# Patient Record
Sex: Male | Born: 1955
Health system: Southern US, Community
[De-identification: ages and names within clinical notes are randomized; demographics above are authoritative.]

## PROBLEM LIST (undated history)

## (undated) DIAGNOSIS — G20A1 Parkinson's disease without dyskinesia, without mention of fluctuations: Secondary | ICD-10-CM

## (undated) DIAGNOSIS — M1990 Unspecified osteoarthritis, unspecified site: Secondary | ICD-10-CM

## (undated) DIAGNOSIS — I2699 Other pulmonary embolism without acute cor pulmonale: Principal | ICD-10-CM

## (undated) DIAGNOSIS — I82409 Acute embolism and thrombosis of unspecified deep veins of unspecified lower extremity: Secondary | ICD-10-CM

## (undated) DIAGNOSIS — R251 Tremor, unspecified: Secondary | ICD-10-CM

## (undated) DIAGNOSIS — R55 Syncope and collapse: Secondary | ICD-10-CM

## (undated) DIAGNOSIS — K429 Umbilical hernia without obstruction or gangrene: Secondary | ICD-10-CM

## (undated) DIAGNOSIS — M549 Dorsalgia, unspecified: Secondary | ICD-10-CM

## (undated) DIAGNOSIS — G20 Parkinson's disease: Secondary | ICD-10-CM

## (undated) DIAGNOSIS — G8929 Other chronic pain: Secondary | ICD-10-CM

## (undated) DIAGNOSIS — K4090 Unilateral inguinal hernia, without obstruction or gangrene, not specified as recurrent: Principal | ICD-10-CM

## (undated) DIAGNOSIS — R569 Unspecified convulsions: Secondary | ICD-10-CM

## (undated) HISTORY — DX: Unspecified osteoarthritis, unspecified site: M19.90

## (undated) HISTORY — DX: Acute embolism and thrombosis of unspecified deep veins of unspecified lower extremity: I82.409

## (undated) HISTORY — DX: Unspecified convulsions: R56.9

## (undated) HISTORY — DX: Other pulmonary embolism without acute cor pulmonale: I26.99

## (undated) HISTORY — DX: Unilateral inguinal hernia, without obstruction or gangrene, not specified as recurrent: K40.90

## (undated) HISTORY — PX: HERNIA REPAIR: SHX51

---

## 1975-03-14 HISTORY — PX: KNEE SURGERY: SHX244

## 1998-11-29 ENCOUNTER — Encounter: Admission: RE | Admit: 1998-11-29 | Discharge: 1998-11-29 | Payer: Self-pay | Admitting: Sports Medicine

## 1999-01-14 ENCOUNTER — Encounter: Admission: RE | Admit: 1999-01-14 | Discharge: 1999-01-14 | Payer: Self-pay | Admitting: Sports Medicine

## 1999-06-16 ENCOUNTER — Encounter: Admission: RE | Admit: 1999-06-16 | Discharge: 1999-06-16 | Payer: Self-pay | Admitting: Family Medicine

## 2001-02-01 ENCOUNTER — Encounter: Admission: RE | Admit: 2001-02-01 | Discharge: 2001-02-01 | Payer: Self-pay | Admitting: Sports Medicine

## 2001-02-01 ENCOUNTER — Encounter: Payer: Self-pay | Admitting: Sports Medicine

## 2001-02-05 ENCOUNTER — Encounter: Admission: RE | Admit: 2001-02-05 | Discharge: 2001-02-05 | Payer: Self-pay | Admitting: Sports Medicine

## 2001-12-07 ENCOUNTER — Encounter: Payer: Self-pay | Admitting: Emergency Medicine

## 2001-12-07 ENCOUNTER — Emergency Department (HOSPITAL_COMMUNITY): Admission: EM | Admit: 2001-12-07 | Discharge: 2001-12-07 | Payer: Self-pay | Admitting: Emergency Medicine

## 2001-12-11 ENCOUNTER — Ambulatory Visit (HOSPITAL_COMMUNITY): Admission: RE | Admit: 2001-12-11 | Discharge: 2001-12-11 | Payer: Self-pay | Admitting: Emergency Medicine

## 2001-12-11 ENCOUNTER — Encounter: Payer: Self-pay | Admitting: Emergency Medicine

## 2001-12-31 ENCOUNTER — Encounter: Payer: Self-pay | Admitting: Gastroenterology

## 2001-12-31 ENCOUNTER — Ambulatory Visit (HOSPITAL_COMMUNITY): Admission: RE | Admit: 2001-12-31 | Discharge: 2001-12-31 | Payer: Self-pay | Admitting: Gastroenterology

## 2002-04-10 ENCOUNTER — Ambulatory Visit (HOSPITAL_COMMUNITY): Admission: RE | Admit: 2002-04-10 | Discharge: 2002-04-10 | Payer: Self-pay | Admitting: Gastroenterology

## 2002-04-10 ENCOUNTER — Encounter: Payer: Self-pay | Admitting: Gastroenterology

## 2002-09-22 ENCOUNTER — Ambulatory Visit (HOSPITAL_COMMUNITY): Admission: RE | Admit: 2002-09-22 | Discharge: 2002-09-22 | Payer: Self-pay | Admitting: Gastroenterology

## 2002-09-22 ENCOUNTER — Encounter: Payer: Self-pay | Admitting: Gastroenterology

## 2002-10-07 ENCOUNTER — Emergency Department (HOSPITAL_COMMUNITY): Admission: EM | Admit: 2002-10-07 | Discharge: 2002-10-08 | Payer: Self-pay | Admitting: Emergency Medicine

## 2002-10-07 ENCOUNTER — Encounter: Payer: Self-pay | Admitting: Emergency Medicine

## 2003-10-16 ENCOUNTER — Ambulatory Visit (HOSPITAL_COMMUNITY): Admission: RE | Admit: 2003-10-16 | Discharge: 2003-10-16 | Payer: Self-pay | Admitting: Gastroenterology

## 2005-12-25 ENCOUNTER — Ambulatory Visit (HOSPITAL_COMMUNITY): Admission: RE | Admit: 2005-12-25 | Discharge: 2005-12-25 | Payer: Self-pay | Admitting: Gastroenterology

## 2009-11-07 ENCOUNTER — Observation Stay (HOSPITAL_COMMUNITY): Admission: EM | Admit: 2009-11-07 | Discharge: 2009-11-10 | Payer: Self-pay | Admitting: Emergency Medicine

## 2009-11-07 DIAGNOSIS — I82409 Acute embolism and thrombosis of unspecified deep veins of unspecified lower extremity: Secondary | ICD-10-CM

## 2009-11-07 DIAGNOSIS — I2699 Other pulmonary embolism without acute cor pulmonale: Secondary | ICD-10-CM

## 2009-11-07 HISTORY — DX: Acute embolism and thrombosis of unspecified deep veins of unspecified lower extremity: I82.409

## 2009-11-07 HISTORY — DX: Other pulmonary embolism without acute cor pulmonale: I26.99

## 2009-11-08 ENCOUNTER — Ambulatory Visit: Payer: Self-pay | Admitting: Vascular Surgery

## 2009-11-08 ENCOUNTER — Encounter (INDEPENDENT_AMBULATORY_CARE_PROVIDER_SITE_OTHER): Payer: Self-pay | Admitting: Family Medicine

## 2009-11-18 ENCOUNTER — Ambulatory Visit: Payer: Self-pay | Admitting: Oncology

## 2010-03-17 ENCOUNTER — Encounter
Admission: RE | Admit: 2010-03-17 | Discharge: 2010-03-17 | Payer: Self-pay | Source: Home / Self Care | Attending: Family Medicine | Admitting: Family Medicine

## 2010-05-26 LAB — COMPREHENSIVE METABOLIC PANEL
ALT: 20 U/L (ref 0–53)
AST: 18 U/L (ref 0–37)
Albumin: 3.6 g/dL (ref 3.5–5.2)
Alkaline Phosphatase: 79 U/L (ref 39–117)
BUN: 11 mg/dL (ref 6–23)
CO2: 30 mEq/L (ref 19–32)
Calcium: 9.1 mg/dL (ref 8.4–10.5)
Chloride: 101 mEq/L (ref 96–112)
Creatinine, Ser: 0.9 mg/dL (ref 0.4–1.5)
GFR calc Af Amer: >60 mL/min (ref >60)
GFR calc non Af Amer: >60 mL/min (ref >60)
Glucose, Bld: 98 mg/dL (ref 70–99)
Potassium: 3.9 mEq/L (ref 3.5–5.1)
Sodium: 137 mEq/L (ref 135–145)
Total Bilirubin: 0.7 mg/dL (ref 0.3–1.2)
Total Protein: 8.3 g/dL (ref 6.0–8.3)

## 2010-05-26 LAB — LIPID PANEL
Cholesterol: 152 mg/dL (ref 0–200)
HDL: 41 mg/dL (ref >39)
LDL Cholesterol: 101 mg/dL — ABNORMAL HIGH (ref 0–99)
Total CHOL/HDL Ratio: 3.7 RATIO
Triglycerides: 52 mg/dL (ref <150)
VLDL: 10 mg/dL (ref 0–40)

## 2010-05-26 LAB — CBC
HCT: 41.6 % (ref 39.0–52.0)
HCT: 41.9 % (ref 39.0–52.0)
HCT: 46.4 % (ref 39.0–52.0)
Hemoglobin: 13.8 g/dL (ref 13.0–17.0)
Hemoglobin: 14.1 g/dL (ref 13.0–17.0)
Hemoglobin: 15.3 g/dL (ref 13.0–17.0)
MCH: 28.6 pg (ref 26.0–34.0)
MCH: 28.8 pg (ref 26.0–34.0)
MCH: 28.9 pg (ref 26.0–34.0)
MCHC: 33 g/dL (ref 30.0–36.0)
MCHC: 33.2 g/dL (ref 30.0–36.0)
MCHC: 33.6 g/dL (ref 30.0–36.0)
MCV: 85.7 fL (ref 78.0–100.0)
MCV: 86.1 fL (ref 78.0–100.0)
MCV: 87.3 fL (ref 78.0–100.0)
Platelets: 200 10*3/uL (ref 150–400)
Platelets: 218 10*3/uL (ref 150–400)
Platelets: 222 10*3/uL (ref 150–400)
RBC: 4.83 MIL/uL (ref 4.22–5.81)
RBC: 4.88 MIL/uL (ref 4.22–5.81)
RBC: 5.32 MIL/uL (ref 4.22–5.81)
RDW: 15.5 % (ref 11.5–15.5)
RDW: 15.7 % — ABNORMAL HIGH (ref 11.5–15.5)
RDW: 15.9 % — ABNORMAL HIGH (ref 11.5–15.5)
WBC: 7.4 10*3/uL (ref 4.0–10.5)
WBC: 7.8 10*3/uL (ref 4.0–10.5)
WBC: 7.9 10*3/uL (ref 4.0–10.5)

## 2010-05-26 LAB — CK TOTAL AND CKMB (NOT AT ARMC)
CK, MB: 1.8 ng/mL (ref 0.3–4.0)
Relative Index: INVALID (ref 0.0–2.5)
Total CK: 91 U/L (ref 7–232)

## 2010-05-26 LAB — PROTIME-INR
INR: 1.09 (ref 0.00–1.49)
INR: 1.18 (ref 0.00–1.49)
INR: 1.43 (ref 0.00–1.49)
INR: 1.76 — ABNORMAL HIGH (ref 0.00–1.49)
Prothrombin Time: 14.3 seconds (ref 11.6–15.2)
Prothrombin Time: 15.2 seconds (ref 11.6–15.2)
Prothrombin Time: 17.6 seconds — ABNORMAL HIGH (ref 11.6–15.2)
Prothrombin Time: 20.7 seconds — ABNORMAL HIGH (ref 11.6–15.2)

## 2010-05-26 LAB — RAPID URINE DRUG SCREEN, HOSP PERFORMED
Amphetamines: NOT DETECTED
Barbiturates: NOT DETECTED
Benzodiazepines: NOT DETECTED
Cocaine: NOT DETECTED
Opiates: POSITIVE — AB
Tetrahydrocannabinol: NOT DETECTED

## 2010-05-26 LAB — POCT CARDIAC MARKERS
CKMB, poc: 1.2 ng/mL (ref 1.0–8.0)
Myoglobin, poc: 87.6 ng/mL (ref 12–200)
Troponin i, poc: <0.05 ng/mL (ref 0.00–0.09)

## 2010-05-26 LAB — CARDIAC PANEL(CRET KIN+CKTOT+MB+TROPI)
CK, MB: 1.2 ng/mL (ref 0.3–4.0)
CK, MB: 1.6 ng/mL (ref 0.3–4.0)
Relative Index: INVALID (ref 0.0–2.5)
Relative Index: INVALID (ref 0.0–2.5)
Total CK: 72 U/L (ref 7–232)
Total CK: 83 U/L (ref 7–232)
Troponin I: 0.01 ng/mL (ref 0.00–0.06)
Troponin I: 0.01 ng/mL (ref 0.00–0.06)

## 2010-05-26 LAB — DIFFERENTIAL
Basophils Absolute: 0 10*3/uL (ref 0.0–0.1)
Basophils Relative: 0 % (ref 0–1)
Eosinophils Absolute: 0.1 10*3/uL (ref 0.0–0.7)
Eosinophils Relative: 1 % (ref 0–5)
Lymphocytes Relative: 10 % — ABNORMAL LOW (ref 12–46)
Lymphs Abs: 0.8 10*3/uL (ref 0.7–4.0)
Monocytes Absolute: 0.5 10*3/uL (ref 0.1–1.0)
Monocytes Relative: 7 % (ref 3–12)
Neutro Abs: 6.4 10*3/uL (ref 1.7–7.7)
Neutrophils Relative %: 82 % — ABNORMAL HIGH (ref 43–77)

## 2010-05-26 LAB — HEMOGLOBIN A1C
Hgb A1c MFr Bld: 6.1 % — ABNORMAL HIGH (ref <5.7)
Mean Plasma Glucose: 128 mg/dL — ABNORMAL HIGH (ref <117)

## 2010-05-26 LAB — TROPONIN I: Troponin I: 0.01 ng/mL (ref 0.00–0.06)

## 2010-05-26 LAB — APTT: aPTT: 35 seconds (ref 24–37)

## 2010-05-26 LAB — D-DIMER, QUANTITATIVE (NOT AT ARMC): D-Dimer, Quant: 7.68 ug/mL-FEU — ABNORMAL HIGH (ref 0.00–0.48)

## 2010-07-27 ENCOUNTER — Other Ambulatory Visit: Payer: Self-pay | Admitting: Oncology

## 2010-07-27 ENCOUNTER — Encounter (HOSPITAL_BASED_OUTPATIENT_CLINIC_OR_DEPARTMENT_OTHER): Payer: 59 | Admitting: Oncology

## 2010-07-27 DIAGNOSIS — I824Z9 Acute embolism and thrombosis of unspecified deep veins of unspecified distal lower extremity: Secondary | ICD-10-CM

## 2010-07-27 LAB — CBC & DIFF AND RETIC
BASO%: 0.3 % (ref 0.0–2.0)
Basophils Absolute: 0 10*3/uL (ref 0.0–0.1)
EOS%: 2.3 % (ref 0.0–7.0)
Eosinophils Absolute: 0.2 10*3/uL (ref 0.0–0.5)
HCT: 42.9 % (ref 38.4–49.9)
HGB: 14 g/dL (ref 13.0–17.1)
Immature Retic Fract: 0.3 % (ref 0.00–13.40)
LYMPH%: 14.5 % (ref 14.0–49.0)
MCH: 26.5 pg — ABNORMAL LOW (ref 27.2–33.4)
MCHC: 32.6 g/dL (ref 32.0–36.0)
MCV: 81.1 fL (ref 79.3–98.0)
MONO#: 0.5 10*3/uL (ref 0.1–0.9)
MONO%: 8.3 % (ref 0.0–14.0)
NEUT#: 4.8 10*3/uL (ref 1.5–6.5)
NEUT%: 74.6 % (ref 39.0–75.0)
Platelets: 223 10*3/uL (ref 140–400)
RBC: 5.29 10*6/uL (ref 4.20–5.82)
RDW: 15.8 % — ABNORMAL HIGH (ref 11.0–14.6)
Retic %: 0.48 % — ABNORMAL LOW (ref 0.50–1.60)
Retic Ct Abs: 25.39 10*3/uL (ref 24.10–77.50)
WBC: 6.5 10*3/uL (ref 4.0–10.3)
lymph#: 0.9 10*3/uL (ref 0.9–3.3)

## 2010-07-27 LAB — PROTIME-INR
INR: 1.1 — ABNORMAL LOW (ref 2.00–3.50)
Protime: 13.2 Seconds (ref 10.6–13.4)

## 2010-07-27 LAB — MORPHOLOGY: PLT EST: ADEQUATE

## 2010-07-27 LAB — CHCC SMEAR

## 2010-07-29 LAB — LUPUS ANTICOAGULANT PANEL
DRVVT 1:1 Mix: 39.5 secs (ref 36.2–44.3)
DRVVT: 52.1 secs — ABNORMAL HIGH (ref 36.2–44.3)
Lupus Anticoagulant: NOT DETECTED
PTT Lupus Anticoagulant: 48.8 secs — ABNORMAL HIGH (ref 30.0–45.6)
PTTLA 4:1 Mix: 43.1 secs (ref 30.0–45.6)

## 2010-07-29 LAB — LACTATE DEHYDROGENASE: LDH: 113 U/L (ref 94–250)

## 2010-07-29 LAB — BETA-2 GLYCOPROTEIN ANTIBODIES
Beta-2-Glycoprotein I IgA: 11 A Units (ref <20)
Beta-2-Glycoprotein I IgM: 2 M Units (ref <20)

## 2010-07-29 LAB — COMPREHENSIVE METABOLIC PANEL
ALT: 17 U/L (ref 0–53)
AST: 16 U/L (ref 0–37)
Albumin: 3.9 g/dL (ref 3.5–5.2)
Alkaline Phosphatase: 80 U/L (ref 39–117)
BUN: 14 mg/dL (ref 6–23)
CO2: 26 mEq/L (ref 19–32)
Calcium: 9.4 mg/dL (ref 8.4–10.5)
Chloride: 102 mEq/L (ref 96–112)
Creatinine, Ser: 0.92 mg/dL (ref 0.40–1.50)
Glucose, Bld: 89 mg/dL (ref 70–99)
Potassium: 3.9 mEq/L (ref 3.5–5.3)
Sodium: 138 mEq/L (ref 135–145)
Total Bilirubin: 0.5 mg/dL (ref 0.3–1.2)
Total Protein: 7.2 g/dL (ref 6.0–8.3)

## 2010-07-29 LAB — CARDIOLIPIN ANTIBODIES, IGG, IGM, IGA
Anticardiolipin IgA: 14 APL U/mL (ref <22)
Anticardiolipin IgG: 16 GPL U/mL (ref <23)
Anticardiolipin IgM: 2 MPL U/mL (ref <11)

## 2010-07-29 LAB — HOMOCYSTEINE: Homocysteine: 8.8 umol/L (ref 4.0–15.4)

## 2010-07-29 LAB — FACTOR 5 LEIDEN

## 2010-07-29 LAB — D-DIMER, QUANTITATIVE (NOT AT ARMC): D-Dimer, Quant: 0.67 ug/mL-FEU — ABNORMAL HIGH (ref 0.00–0.48)

## 2010-07-29 LAB — PROTHROMBIN GENE MUTATION

## 2010-08-03 ENCOUNTER — Other Ambulatory Visit: Payer: Self-pay | Admitting: Oncology

## 2010-08-03 ENCOUNTER — Encounter (HOSPITAL_BASED_OUTPATIENT_CLINIC_OR_DEPARTMENT_OTHER): Payer: 59 | Admitting: Oncology

## 2010-08-03 DIAGNOSIS — I824Z9 Acute embolism and thrombosis of unspecified deep veins of unspecified distal lower extremity: Secondary | ICD-10-CM

## 2010-08-03 DIAGNOSIS — K769 Liver disease, unspecified: Secondary | ICD-10-CM

## 2010-08-09 LAB — ANTITHROMBIN III: AntiThromb III Func: 79 % (ref 76–126)

## 2010-08-09 LAB — PROTEIN S, TOTAL: Protein S Total: 79 % (ref 60–150)

## 2010-08-09 LAB — PROTEIN C ACTIVITY: Protein C Activity: 112 % (ref 75–133)

## 2010-08-09 LAB — PROTEIN S, ANTIGEN, FREE: Protein S Ag, Free: 58 % normal (ref 57–171)

## 2010-08-09 LAB — PROTEIN C, TOTAL: Protein C, Total: 76 % (ref 72–160)

## 2010-08-09 LAB — PROTEIN S ACTIVITY: Protein S Activity: 71 % (ref 69–129)

## 2010-08-17 ENCOUNTER — Ambulatory Visit (HOSPITAL_COMMUNITY)
Admission: RE | Admit: 2010-08-17 | Discharge: 2010-08-17 | Disposition: A | Discharge status: Home / Self Care | Payer: 59 | Source: Ambulatory Visit | Attending: Oncology | Admitting: Oncology

## 2010-08-17 DIAGNOSIS — K769 Liver disease, unspecified: Secondary | ICD-10-CM

## 2011-01-16 ENCOUNTER — Other Ambulatory Visit: Payer: Self-pay | Admitting: Oncology

## 2011-01-16 ENCOUNTER — Other Ambulatory Visit (HOSPITAL_BASED_OUTPATIENT_CLINIC_OR_DEPARTMENT_OTHER): Payer: 59 | Admitting: Lab

## 2011-01-16 DIAGNOSIS — I824Z9 Acute embolism and thrombosis of unspecified deep veins of unspecified distal lower extremity: Secondary | ICD-10-CM

## 2011-01-16 DIAGNOSIS — I2699 Other pulmonary embolism without acute cor pulmonale: Secondary | ICD-10-CM

## 2011-01-16 LAB — D-DIMER, QUANTITATIVE (NOT AT ARMC): D-Dimer, Quant: 0.68 ug/mL-FEU — ABNORMAL HIGH (ref 0.00–0.48)

## 2011-01-17 LAB — PROTEIN C ACTIVITY
Protein C Activity: 112 % (ref 75–133)
Protein C Activity: 112 % (ref 75–133)

## 2011-01-17 LAB — PROTEIN S, TOTAL
Protein S Total: 79 % (ref 60–150)
Protein S Total: 79 % (ref 60–150)

## 2011-01-17 LAB — PROTEIN C, TOTAL
Protein C, Total: 76 % (ref 72–160)
Protein C, Total: 76 % (ref 72–160)

## 2011-01-17 LAB — PROTEIN S ACTIVITY
Protein S Activity: 71 % (ref 69–129)
Protein S Activity: 71 % (ref 69–129)

## 2011-01-17 LAB — ANTITHROMBIN III
AntiThromb III Func: 79 % (ref 76–126)
AntiThromb III Func: 79 % (ref 76–126)

## 2011-01-17 LAB — PROTEIN S, ANTIGEN, FREE: Protein S Ag, Free: 58 % normal (ref 57–171)

## 2011-02-06 ENCOUNTER — Telehealth: Payer: Self-pay | Admitting: *Deleted

## 2011-02-06 NOTE — Telephone Encounter (Signed)
Pt. Would like results of his lab tests done three weeks ago 01/16/11.  Please call him at [phone].

## 2011-02-08 NOTE — Letter (Signed)
February 07, 2011    Clydie Braun L. Hal Hope, M.D. [address] [place], [place] [address]  NAME:  Cory Johnson, Cory Johnson MRN:  [id number] DOB:  Jun 15, 1955  Dear Clydie Braun:  Sorry for the delay in this letter.  I evaluated Cory Johnson back in May for unprovoked DVT and pulmonary embolus, which occurred in August 2011.  He stopped his Coumadin after 6 months.  I took the opportunity to test a hypercoagulation profile.  The results do not give a specific reason why he clotted.  He has a persistent mild elevation of D-dimer at 0.68 (0-0.48) repeated again just this month.  The labs from May 2012 show antithrombin level 79% of control (76-126), plasma homocystine normal at 8.8.  He tests negative for both the factor V Leiden and the prothrombin gene mutations.  He does not have any elevation of anticardiolipin antibodies or anti-beta-2 glycoprotein 1 antibodies and a lupus anticoagulant is not detected.  The functional protein C level is 112% (71-133) with borderline low functional protein S of 71% (69- 129), and free protein S (57-171).  It is hard to estimate his long-term risk.  He is still a young man.  I left him a message to go on 1 aspirin daily.  Although this does not provide a significant benefit, I believe it is better than nothing. Some recent data reported in the [place] Journal by an [place] group supports this.  I did not check a factor VIII activity.  Elevated factor VIII levels to standard deviations or above control value have been associated with increased risk for pulmonary emboli, especially in African Americans. It might be worthwhile checking this.  We can get him back to our office to do the lab testing.   Sincerely,    Levert Feinstein, M.D., F.A.C.P.  JMG/MEDQ  D:  02/07/2011  T:  02/08/2011  Job:  [id number]  CC:   Griffith Citron, M.D.

## 2011-02-11 DIAGNOSIS — K4090 Unilateral inguinal hernia, without obstruction or gangrene, not specified as recurrent: Secondary | ICD-10-CM

## 2011-02-11 DIAGNOSIS — K429 Umbilical hernia without obstruction or gangrene: Secondary | ICD-10-CM

## 2011-02-11 HISTORY — DX: Unilateral inguinal hernia, without obstruction or gangrene, not specified as recurrent: K40.90

## 2011-02-11 HISTORY — DX: Umbilical hernia without obstruction or gangrene: K42.9

## 2011-02-24 ENCOUNTER — Ambulatory Visit (INDEPENDENT_AMBULATORY_CARE_PROVIDER_SITE_OTHER): Payer: 59

## 2011-02-24 DIAGNOSIS — K4090 Unilateral inguinal hernia, without obstruction or gangrene, not specified as recurrent: Secondary | ICD-10-CM

## 2011-03-02 ENCOUNTER — Encounter (INDEPENDENT_AMBULATORY_CARE_PROVIDER_SITE_OTHER): Payer: Self-pay | Admitting: General Surgery

## 2011-03-02 ENCOUNTER — Ambulatory Visit (INDEPENDENT_AMBULATORY_CARE_PROVIDER_SITE_OTHER): Payer: 59 | Admitting: General Surgery

## 2011-03-02 VITALS — BP 118/76 | HR 70 | Temp 97.6°F | Resp 18 | Ht 73.0 in | Wt 243.2 lb

## 2011-03-02 DIAGNOSIS — K4090 Unilateral inguinal hernia, without obstruction or gangrene, not specified as recurrent: Secondary | ICD-10-CM

## 2011-03-02 DIAGNOSIS — K429 Umbilical hernia without obstruction or gangrene: Secondary | ICD-10-CM

## 2011-03-02 NOTE — Patient Instructions (Signed)
You will be scheduled for surgery to repair both your umbilical hernia and your right inguinal hernia with mesh.  Inguinal Hernia, Adult Muscles help keep everything in the body in its proper place. But if a weak spot in the muscles develops, something can poke through. That is called a hernia. When this happens in the lower part of the belly (abdomen), it is called an inguinal hernia. (It takes its name from a part of the body in this region called the inguinal canal.) A weak spot in the wall of muscles lets some fat or part of the small intestine bulge through. An inguinal hernia can develop at any age. Men get them more often than women. CAUSES  In adults, an inguinal hernia develops over time.  It can be triggered by:   Suddenly straining the muscles of the lower abdomen.   Lifting heavy objects.   Straining to have a bowel movement. Difficult bowel movements (constipation) can lead to this.   Constant coughing. This may be caused by smoking or lung disease.   Being overweight.   Being pregnant.   Working at a job that requires long periods of standing or heavy lifting.   Having had an inguinal hernia before.  One type can be an emergency situation. It is called a strangulated inguinal hernia. It develops if part of the small intestine slips through the weak spot and cannot get back into the abdomen. The blood supply can be cut off. If that happens, part of the intestine may die. This situation requires emergency surgery. SYMPTOMS  Often, a small inguinal hernia has no symptoms. It is found when a healthcare provider does a physical exam. Larger hernias usually have symptoms.   In adults, symptoms may include:   A lump in the groin. This is easier to see when the person is standing. It might disappear when lying down.   In men, a lump in the scrotum.   Pain or burning in the groin. This occurs especially when lifting, straining or coughing.   A dull ache or feeling of pressure  in the groin.   Signs of a strangulated hernia can include:   A bulge in the groin that becomes very painful and tender to the touch.   A bulge that turns red or purple.   Fever, nausea and vomiting.   Inability to have a bowel movement or to pass gas.  DIAGNOSIS  To decide if you have an inguinal hernia, a healthcare provider will probably do a physical examination.  This will include asking questions about any symptoms you have noticed.   The healthcare provider might feel the groin area and ask you to cough. If an inguinal hernia is felt, the healthcare provider may try to slide it back into the abdomen.   Usually no other tests are needed.  TREATMENT  Treatments can vary. The size of the hernia makes a difference. Options include:  Watchful waiting. This is often suggested if the hernia is small and you have had no symptoms.   No medical procedure will be done unless symptoms develop.   You will need to watch closely for symptoms. If any occur, contact your healthcare provider right away.   Surgery. This is used if the hernia is larger or you have symptoms.   Open surgery. This is usually an outpatient procedure (you will not stay overnight in a hospital). An cut (incision) is made through the skin in the groin. The hernia is put back inside  the abdomen. The weak area in the muscles is then repaired by herniorrhaphy or hernioplasty. Herniorrhaphy: in this type of surgery, the weak muscles are sewn back together. Hernioplasty: a patch or mesh is used to close the weak area in the abdominal wall.   Laparoscopy. In this procedure, a surgeon makes small incisions. A thin tube with a tiny video camera (called a laparoscope) is put into the abdomen. The surgeon repairs the hernia with mesh by looking with the video camera and using two long instruments.  HOME CARE INSTRUCTIONS   After surgery to repair an inguinal hernia:   You will need to take pain medicine prescribed by your  healthcare provider. Follow all directions carefully.   You will need to take care of the wound from the incision.   Your activity will be restricted for awhile. This will probably include no heavy lifting for several weeks. You also should not do anything too active for a few weeks. When you can return to work will depend on the type of job that you have.   During "watchful waiting" periods, you should:   Maintain a healthy weight.   Eat a diet high in fiber (fruits, vegetables and whole grains).   Drink plenty of fluids to avoid constipation. This means drinking enough water and other liquids to keep your urine clear or pale yellow.   Do not lift heavy objects.   Do not stand for long periods of time.   Quit smoking. This should keep you from developing a frequent cough.  SEEK MEDICAL CARE IF:   A bulge develops in your groin area.   You feel pain, a burning sensation or pressure in the groin. This might be worse if you are lifting or straining.   You develop a fever of more than 100.5 F (38.1 C).  SEEK IMMEDIATE MEDICAL CARE IF:   Pain in the groin increases suddenly.   A bulge in the groin gets bigger suddenly and does not go down.   For men, there is sudden pain in the scrotum. Or, the size of the scrotum increases.   A bulge in the groin area becomes red or purple and is painful to touch.   You have nausea or vomiting that does not go away.   You feel your heart beating much faster than normal.   You cannot have a bowel movement or pass gas.   You develop a fever of more than 102.0 F (38.9 C).  Document Released: 07/16/2008 Document Revised: 11/09/2010 Document Reviewed: 07/16/2008 [facility] Patient Information 2012 [place], [place].

## 2011-03-02 NOTE — Progress Notes (Signed)
Patient ID: Cory Johnson, male   DOB: 1955-12-27, 55 y.o.   MRN: [id number]  Chief Complaint  Patient presents with  . Other    Eval of right inguinal hernia    HPI Cory Johnson is a 55 y.o. male.  He is referred by Dr. Warner Mccreedy for evaluation of a painful right inguinal hernia and an enlarging  umbilical hernia.  The patient states that he has had a painful area in his right groin for about 2 weeks. It hurts when he exercises or walks. It has not been incarcerated. He feels a small bulge. He's had an umbilical hernia for 2 years. It has gotten large but it really doesn't her that much.  He doesn't really have any medical problems although because of arthritis his neck is immobile and does not have flexion or extension. He has had a colonoscopy recently. HPI  Past Medical History  Diagnosis Date  . Right inguinal hernia   . Arthritis   . Clotting disorder     Past Surgical History  Procedure Date  . Tendon repair 08/1975    left knee    History reviewed. No pertinent family history.  Social History History  Substance Use Topics  . Smoking status: Never Smoker   . Smokeless tobacco: Never Used  . Alcohol Use: No    No Known Allergies  Current Outpatient Prescriptions  Medication Sig Dispense Refill  . HYDROcodone-acetaminophen (NORCO) 10-325 MG per tablet 5 times daily.        Review of Systems Review of Systems  Constitutional: Negative for fever, chills and unexpected weight change.  HENT: Negative for hearing loss, congestion, sore throat, trouble swallowing and voice change.   Eyes: Negative for visual disturbance.  Respiratory: Negative for cough and wheezing.   Cardiovascular: Negative for chest pain, palpitations and leg swelling.  Gastrointestinal: Negative for nausea, vomiting, abdominal pain, diarrhea, constipation, blood in stool, abdominal distention, anal bleeding and rectal pain.  Genitourinary: Negative for hematuria and difficulty  urinating.  Musculoskeletal: Negative for arthralgias.  Skin: Negative for rash and wound.  Neurological: Negative for seizures, syncope, weakness and headaches.  Hematological: Negative for adenopathy. Does not bruise/bleed easily.  Psychiatric/Behavioral: Negative for confusion.    Blood pressure 118/76, pulse 70, temperature 97.6 F (36.4 C), temperature source Temporal, resp. rate 18, height 6\' 1"  (1.854 m), weight 243 lb 3.2 oz (110.315 kg).  Physical Exam Physical Exam  Constitutional: He is oriented to person, place, and time. He appears well-developed and well-nourished. No distress.  HENT:  Head: Normocephalic.  Nose: Nose normal.  Mouth/Throat: No oropharyngeal exudate.  Eyes: Conjunctivae and EOM are normal. Pupils are equal, round, and reactive to light. Right eye exhibits no discharge. Left eye exhibits no discharge. No scleral icterus.  Neck: Normal range of motion. Neck supple. No JVD present. No tracheal deviation present. No thyromegaly present.       Neck is immobile. Will not flex or extend. no scars.  Cardiovascular: Normal rate, regular rhythm, normal heart sounds and intact distal pulses.   No murmur heard. Pulmonary/Chest: Effort normal and breath sounds normal. No stridor. No respiratory distress. He has no wheezes. He has no rales. He exhibits no tenderness.  Abdominal: Soft. Bowel sounds are normal. He exhibits no distension and no mass. There is no tenderness. There is no rebound and no guarding.    Genitourinary:     Musculoskeletal: Normal range of motion. He exhibits no edema and no tenderness.  Lymphadenopathy:  He has no cervical adenopathy.  Neurological: He is alert and oriented to person, place, and time. He has normal reflexes. Coordination normal.  Skin: Skin is warm and dry. No rash noted. He is not diaphoretic. No erythema. No pallor.  Psychiatric: He has a normal mood and affect. His behavior is normal. Judgment and thought content normal.     Data Reviewed I reviewed an office note from urgent medical care Assessment    Painful right inguinal hernia, reducible.  Large umbilical hernia, reducible  Severe arthritis and neck with loss of flexion and extension.  Past history deep venous thrombosis left leg with pulmonary embolism, on Coumadin for 6 months. Currently not on Coumadin.    Plan    Will schedule for elective repair of right renal hernia and umbilical hernia with mesh. I will do this as an open repair.  Preop anesthesia consult regarding loss of neck flexion and extension. This will be increased airway riskt.  Subcutaneous heparin preop--- history of DVT.  I have discussed the indications and details of surgery with the patient and his wife. Risks and complications have been outlined, including but not limited to bleeding, infection, recurrence of the hernia, injury to adjacent organs as the intestine or bladder with  major reconstructive surgery, nerve damage with chronic pain, cardiac, pulmonary, airway, and  embolic problems. He seems to understand these issues well. At this time his questions are answered. He agrees with the plan.       Nilaya Bouie M 03/02/2011, 9:16 AM

## 2011-03-10 ENCOUNTER — Encounter (HOSPITAL_BASED_OUTPATIENT_CLINIC_OR_DEPARTMENT_OTHER): Payer: Self-pay | Admitting: *Deleted

## 2011-03-10 NOTE — Pre-Procedure Instructions (Signed)
To come for EKG and CXR

## 2011-03-10 NOTE — Pre-Procedure Instructions (Signed)
Discussed potential intubation difficulty with Dr. Gelene Mink - anesthesia will evaluate pt. when he comes for labs 03/15/2011

## 2011-03-15 ENCOUNTER — Ambulatory Visit (INDEPENDENT_AMBULATORY_CARE_PROVIDER_SITE_OTHER): Payer: 59 | Admitting: General Surgery

## 2011-03-16 NOTE — H&P (Signed)
Cory Johnson     MRN: [id number]   Description: 56 year old male  Provider: Ernestene Mention, MD  Department: [facility]        Diagnoses     Right inguinal hernia   - Primary    550.90    Umbilical hernia     553.1      Reason for Visit   nia        Vitals - Last Recorded       BP Pulse Temp(Src) Resp Ht Wt    118/76  70  97.6 F (36.4 C) (Temporal)  18  6\' 1"  (1.854 m)  243 lb 3.2 oz (110.315 kg)          BMI    32.09 kg/m2                 H & P   Cory Mention, MD    Signed Patient ID: Cory Johnson, male   DOB: [birth year], 56 y.o.   MRN: [id number]    Chief Complaint   Patient presents with   .  Other       Eval of right inguinal hernia      HPI Cory Johnson is a 56 y.o. male.  He is referred by Dr. Warner Johnson for evaluation of a painful right inguinal hernia and an enlarging  umbilical hernia.  The patient states that he has had a painful area in his right groin for about 2 weeks. It hurts when he exercises or walks. It has not been incarcerated. He feels a small bulge. He's had an umbilical hernia for 2 years. It has gotten large but it really doesn't her that much.  He doesn't really have any medical problems although because of arthritis his neck is immobile and does not have flexion or extension. He has had a colonoscopy recently.      Past Medical History   Diagnosis  Date   .  Right inguinal hernia     .  Arthritis     .  Clotting disorder         Past Surgical History   Procedure  Date   .  Tendon repair  08/1975       left knee      History reviewed. No pertinent family history.   Social History History   Substance Use Topics   .  Smoking status:  Never Smoker    .  Smokeless tobacco:  Never Used   .  Alcohol Use:  No      No Known Allergies    Current Outpatient Prescriptions   Medication  Sig  Dispense  Refill   .  HYDROcodone-acetaminophen (NORCO) 10-325 MG per tablet  5 times daily.             Review of Systems  Constitutional: Negative for fever, chills and unexpected weight change.  HENT: Negative for hearing loss, congestion, sore throat, trouble swallowing and voice change.   Eyes: Negative for visual disturbance.  Respiratory: Negative for cough and wheezing.   Cardiovascular: Negative for chest pain, palpitations and leg swelling.  Gastrointestinal: Negative for nausea, vomiting, abdominal pain, diarrhea, constipation, blood in stool, abdominal distention, anal bleeding and rectal pain.  Genitourinary: Negative for hematuria and difficulty urinating.  Musculoskeletal: Negative for arthralgias.  Skin: Negative for rash and wound.  Neurological: Negative for seizures, syncope, weakness and headaches.  Hematological: Negative for adenopathy. Does not bruise/bleed easily.  Psychiatric/Behavioral: Negative for confusion.    Blood pressure 118/76, pulse 70, temperature 97.6 F (36.4 C), temperature source Temporal, resp. rate 18, height 6\' 1"  (1.854 m), weight 243 lb 3.2 oz (110.315 kg).   Physical Exam   Constitutional: He is oriented to person, place, and time. He appears well-developed and well-nourished. No distress.  HENT:   Head: Normocephalic.   Nose: Nose normal.   Mouth/Throat: No oropharyngeal exudate.  Eyes: Conjunctivae and EOM are normal. Pupils are equal, round, and reactive to light. Right eye exhibits no discharge. Left eye exhibits no discharge. No scleral icterus.  Neck: Normal range of motion. Neck supple. No JVD present. No tracheal deviation present. No thyromegaly present.       Neck is immobile. Will not flex or extend. no scars.  Cardiovascular: Normal rate, regular rhythm, normal heart sounds and intact distal pulses.    No murmur heard. Pulmonary/Chest: Effort normal and breath sounds normal. No stridor. No respiratory distress. He has no wheezes. He has no rales. He exhibits no tenderness.  Abdominal: Soft. Bowel sounds are normal. He  exhibits no distension and no mass. There is no tenderness. There is no rebound and no guarding.    Genitourinary:     Musculoskeletal: Normal range of motion. He exhibits no edema and no tenderness.  Lymphadenopathy:    He has no cervical adenopathy.  Neurological: He is alert and oriented to person, place, and time. He has normal reflexes. Coordination normal.  Skin: Skin is warm and dry. No rash noted. He is not diaphoretic. No erythema. No pallor.  Psychiatric: He has a normal mood and affect. His behavior is normal. Judgment and thought content normal.    Data Reviewed I reviewed an office note from urgent medical care   Assessment Painful right inguinal hernia, reducible.  Large umbilical hernia, reducible  Severe arthritis and neck with loss of flexion and extension.  Past history deep venous thrombosis left leg with pulmonary embolism, on Coumadin for 6 months. Currently not on Coumadin.   Plan Will schedule for elective repair of right renal hernia and umbilical hernia with mesh. I will do this as an open repair.  Preop anesthesia consult regarding loss of neck flexion and extension. This will be increased airway riskt.  Subcutaneous heparin preop--- history of DVT.  I have discussed the indications and details of surgery with the patient and his wife. Risks and complications have been outlined, including but not limited to bleeding, infection, recurrence of the hernia, injury to adjacent organs as the intestine or bladder with  major reconstructive surgery, nerve damage with chronic pain, cardiac, pulmonary, airway, and  embolic problems. He seems to understand these issues well. At this time his questions are answered. He agrees with the plan.     Cory Johnson M 03/16/2011 6:45 AM

## 2011-03-17 ENCOUNTER — Encounter (HOSPITAL_BASED_OUTPATIENT_CLINIC_OR_DEPARTMENT_OTHER)
Admission: RE | Disposition: A | Discharge status: Home / Self Care | Payer: Self-pay | Source: Ambulatory Visit | Attending: General Surgery

## 2011-03-17 ENCOUNTER — Encounter (HOSPITAL_BASED_OUTPATIENT_CLINIC_OR_DEPARTMENT_OTHER): Payer: Self-pay | Admitting: *Deleted

## 2011-03-17 ENCOUNTER — Ambulatory Visit (HOSPITAL_COMMUNITY): Payer: 59

## 2011-03-17 ENCOUNTER — Other Ambulatory Visit: Payer: Self-pay

## 2011-03-17 ENCOUNTER — Encounter (HOSPITAL_BASED_OUTPATIENT_CLINIC_OR_DEPARTMENT_OTHER): Payer: Self-pay | Admitting: Anesthesiology

## 2011-03-17 ENCOUNTER — Ambulatory Visit (HOSPITAL_BASED_OUTPATIENT_CLINIC_OR_DEPARTMENT_OTHER)
Admission: RE | Admit: 2011-03-17 | Discharge: 2011-03-17 | Disposition: A | Discharge status: Home / Self Care | Payer: 59 | Source: Ambulatory Visit | Attending: General Surgery | Admitting: General Surgery

## 2011-03-17 ENCOUNTER — Ambulatory Visit (HOSPITAL_BASED_OUTPATIENT_CLINIC_OR_DEPARTMENT_OTHER): Payer: 59 | Admitting: Anesthesiology

## 2011-03-17 DIAGNOSIS — K429 Umbilical hernia without obstruction or gangrene: Secondary | ICD-10-CM | POA: Diagnosis present

## 2011-03-17 DIAGNOSIS — K420 Umbilical hernia with obstruction, without gangrene: Secondary | ICD-10-CM

## 2011-03-17 DIAGNOSIS — K4090 Unilateral inguinal hernia, without obstruction or gangrene, not specified as recurrent: Secondary | ICD-10-CM

## 2011-03-17 HISTORY — DX: Dorsalgia, unspecified: M54.9

## 2011-03-17 HISTORY — DX: Umbilical hernia without obstruction or gangrene: K42.9

## 2011-03-17 HISTORY — PX: INGUINAL HERNIA REPAIR: SHX194

## 2011-03-17 HISTORY — PX: UMBILICAL HERNIA REPAIR: SHX196

## 2011-03-17 HISTORY — DX: Other chronic pain: G89.29

## 2011-03-17 LAB — POCT HEMOGLOBIN-HEMACUE: Hemoglobin: 14.3 g/dL (ref 13.0–17.0)

## 2011-03-17 SURGERY — REPAIR, HERNIA, INGUINAL, ADULT
Anesthesia: General | Laterality: Right | Wound class: Clean

## 2011-03-17 MED ORDER — MIDAZOLAM HCL 5 MG/5ML IJ SOLN
INTRAMUSCULAR | Status: DC | PRN
  Administered 2011-03-17: 2 mg via INTRAVENOUS

## 2011-03-17 MED ORDER — SODIUM CHLORIDE 0.9 % IJ SOLN: 3.0000 mL | Freq: Two times a day (BID) | INTRAMUSCULAR | Status: DC

## 2011-03-17 MED ORDER — EPHEDRINE SULFATE 50 MG/ML IJ SOLN
INTRAMUSCULAR | Status: DC | PRN
  Administered 2011-03-17: 10 mg via INTRAVENOUS

## 2011-03-17 MED ORDER — METOCLOPRAMIDE HCL 5 MG/ML IJ SOLN: 10.0000 mg | Freq: Once | INTRAMUSCULAR | Status: DC | PRN

## 2011-03-17 MED ORDER — DROPERIDOL 2.5 MG/ML IJ SOLN
INTRAMUSCULAR | Status: DC | PRN
  Administered 2011-03-17: 0.625 mg via INTRAVENOUS

## 2011-03-17 MED ORDER — HEPARIN SODIUM (PORCINE) 5000 UNIT/ML IJ SOLN
5000.0000 [IU] | Freq: Once | INTRAMUSCULAR | Status: AC
  Administered 2011-03-17: 5000 [IU] via SUBCUTANEOUS

## 2011-03-17 MED ORDER — OXYCODONE HCL 5 MG PO TABS: 5.0000 mg | ORAL_TABLET | ORAL | Status: DC | PRN

## 2011-03-17 MED ORDER — SODIUM CHLORIDE 0.9 % IV SOLN: INTRAVENOUS | Status: DC

## 2011-03-17 MED ORDER — NEOSTIGMINE METHYLSULFATE 1 MG/ML IJ SOLN
INTRAMUSCULAR | Status: DC | PRN
  Administered 2011-03-17: 3 mg via INTRAVENOUS

## 2011-03-17 MED ORDER — HYDROMORPHONE HCL PF 1 MG/ML IJ SOLN
0.5000 mg | INTRAMUSCULAR | Status: DC | PRN
  Administered 2011-03-17: 0.5 mg via INTRAVENOUS

## 2011-03-17 MED ORDER — KETOROLAC TROMETHAMINE 30 MG/ML IJ SOLN
INTRAMUSCULAR | Status: DC | PRN
  Administered 2011-03-17: 30 mg via INTRAVENOUS

## 2011-03-17 MED ORDER — FENTANYL CITRATE 0.05 MG/ML IJ SOLN
25.0000 ug | INTRAMUSCULAR | Status: DC | PRN
  Administered 2011-03-17: 25 ug via INTRAVENOUS
  Administered 2011-03-17: 50 ug via INTRAVENOUS

## 2011-03-17 MED ORDER — MORPHINE SULFATE 2 MG/ML IJ SOLN: 0.0500 mg/kg | INTRAMUSCULAR | Status: DC | PRN

## 2011-03-17 MED ORDER — SUCCINYLCHOLINE CHLORIDE 20 MG/ML IJ SOLN
INTRAMUSCULAR | Status: DC | PRN
  Administered 2011-03-17: 140 mg via INTRAVENOUS

## 2011-03-17 MED ORDER — OXYCODONE-ACETAMINOPHEN 5-325 MG PO TABS
1.0000 | ORAL_TABLET | Freq: Once | ORAL | Status: AC
  Administered 2011-03-17: 1 via ORAL

## 2011-03-17 MED ORDER — GLYCOPYRROLATE 0.2 MG/ML IJ SOLN
INTRAMUSCULAR | Status: DC | PRN
  Administered 2011-03-17: .4 mg via INTRAVENOUS

## 2011-03-17 MED ORDER — ONDANSETRON HCL 4 MG/2ML IJ SOLN
INTRAMUSCULAR | Status: DC | PRN
  Administered 2011-03-17: 4 mg via INTRAVENOUS

## 2011-03-17 MED ORDER — CEFAZOLIN SODIUM-DEXTROSE 2-3 GM-% IV SOLR: 2.0000 g | INTRAVENOUS | Status: DC

## 2011-03-17 MED ORDER — PROPOFOL 10 MG/ML IV EMUL
INTRAVENOUS | Status: DC | PRN
  Administered 2011-03-17: 300 mg via INTRAVENOUS

## 2011-03-17 MED ORDER — LACTATED RINGERS IV SOLN
INTRAVENOUS | Status: DC
  Administered 2011-03-17: 09:00:00 via INTRAVENOUS
  Administered 2011-03-17: 20 mL/h via INTRAVENOUS
  Administered 2011-03-17: 07:00:00 via INTRAVENOUS

## 2011-03-17 MED ORDER — MORPHINE SULFATE 2 MG/ML IJ SOLN: 2.0000 mg | INTRAMUSCULAR | Status: DC | PRN

## 2011-03-17 MED ORDER — SODIUM CHLORIDE 0.9 % IJ SOLN: 3.0000 mL | INTRAMUSCULAR | Status: DC | PRN

## 2011-03-17 MED ORDER — MIDAZOLAM HCL 2 MG/2ML IJ SOLN: 0.5000 mg | INTRAMUSCULAR | Status: DC | PRN

## 2011-03-17 MED ORDER — ACETAMINOPHEN 650 MG RE SUPP: 650.0000 mg | RECTAL | Status: DC | PRN

## 2011-03-17 MED ORDER — FENTANYL CITRATE 0.05 MG/ML IJ SOLN: 50.0000 ug | INTRAMUSCULAR | Status: DC | PRN

## 2011-03-17 MED ORDER — FENTANYL CITRATE 0.05 MG/ML IJ SOLN
INTRAMUSCULAR | Status: DC | PRN
  Administered 2011-03-17 (×4): 50 ug via INTRAVENOUS

## 2011-03-17 MED ORDER — ONDANSETRON HCL 4 MG/2ML IJ SOLN: 4.0000 mg | Freq: Four times a day (QID) | INTRAMUSCULAR | Status: DC | PRN

## 2011-03-17 MED ORDER — BUPIVACAINE-EPINEPHRINE 0.5% -1:200000 IJ SOLN
INTRAMUSCULAR | Status: DC | PRN
  Administered 2011-03-17: 30 mL

## 2011-03-17 MED ORDER — DEXAMETHASONE SODIUM PHOSPHATE 4 MG/ML IJ SOLN
INTRAMUSCULAR | Status: DC | PRN
  Administered 2011-03-17: 10 mg via INTRAVENOUS

## 2011-03-17 MED ORDER — ACETAMINOPHEN 325 MG PO TABS: 650.0000 mg | ORAL_TABLET | ORAL | Status: DC | PRN

## 2011-03-17 MED ORDER — OXYCODONE-ACETAMINOPHEN 5-500 MG PO CAPS: 1.0000 | ORAL_CAPSULE | ORAL | Status: AC | PRN

## 2011-03-17 MED ORDER — CISATRACURIUM BESYLATE 2 MG/ML IV SOLN
INTRAVENOUS | Status: DC | PRN
  Administered 2011-03-17: 2 mg via INTRAVENOUS
  Administered 2011-03-17: 8 mg via INTRAVENOUS
  Administered 2011-03-17: 6 mg via INTRAVENOUS

## 2011-03-17 MED ORDER — SODIUM CHLORIDE 0.9 % IV SOLN: 250.0000 mL | INTRAVENOUS | Status: DC | PRN

## 2011-03-17 MED ORDER — PROMETHAZINE HCL 25 MG/ML IJ SOLN: 12.5000 mg | Freq: Four times a day (QID) | INTRAMUSCULAR | Status: DC | PRN

## 2011-03-17 SURGICAL SUPPLY — 74 items
APPLICATOR COTTON TIP 6IN STRL (MISCELLANEOUS) IMPLANT
BENZOIN TINCTURE PRP APPL 2/3 (GAUZE/BANDAGES/DRESSINGS) IMPLANT
BLADE HEX COATED 2.75 (ELECTRODE) ×3 IMPLANT
BLADE SURG 10 STRL SS (BLADE) IMPLANT
BLADE SURG 15 STRL LF DISP TIS (BLADE) ×2 IMPLANT
BLADE SURG 15 STRL SS (BLADE) ×1
BLADE SURG ROTATE 9660 (MISCELLANEOUS) ×3 IMPLANT
CANISTER SUCTION 1200CC (MISCELLANEOUS) ×3 IMPLANT
CHLORAPREP W/TINT 26ML (MISCELLANEOUS) ×3 IMPLANT
CLOTH BEACON ORANGE TIMEOUT ST (SAFETY) ×3 IMPLANT
COVER MAYO STAND STRL (DRAPES) ×3 IMPLANT
COVER TABLE BACK 60X90 (DRAPES) ×3 IMPLANT
DECANTER SPIKE VIAL GLASS SM (MISCELLANEOUS) ×3 IMPLANT
DERMABOND ADVANCED (GAUZE/BANDAGES/DRESSINGS) ×5
DERMABOND ADVANCED .7 DNX12 (GAUZE/BANDAGES/DRESSINGS) ×10 IMPLANT
DRAIN PENROSE 1/2X12 LTX STRL (WOUND CARE) ×3 IMPLANT
DRAPE LAPAROTOMY TRNSV 102X78 (DRAPE) IMPLANT
DRAPE PED LAPAROTOMY (DRAPES) ×3 IMPLANT
DRAPE UTILITY XL STRL (DRAPES) ×3 IMPLANT
ELECT REM PT RETURN 9FT ADLT (ELECTROSURGICAL) ×3
ELECTRODE REM PT RTRN 9FT ADLT (ELECTROSURGICAL) ×2 IMPLANT
GAUZE SPONGE 4X4 12PLY STRL LF (GAUZE/BANDAGES/DRESSINGS) IMPLANT
GLOVE ECLIPSE 6.5 STRL STRAW (GLOVE) ×6 IMPLANT
GLOVE EUDERMIC 7 POWDERFREE (GLOVE) ×3 IMPLANT
GOWN BRE IMP SLV AUR XL STRL (GOWN DISPOSABLE) ×3 IMPLANT
GOWN PREVENTION PLUS XLARGE (GOWN DISPOSABLE) ×3 IMPLANT
GOWN PREVENTION PLUS XXLARGE (GOWN DISPOSABLE) ×3 IMPLANT
MESH ULTRAPRO 3X6 7.6X15CM (Mesh General) ×3 IMPLANT
NEEDLE HYPO 25X1 1.5 SAFETY (NEEDLE) ×3 IMPLANT
NS IRRIG 1000ML POUR BTL (IV SOLUTION) ×3 IMPLANT
PACK BASIN DAY SURGERY FS (CUSTOM PROCEDURE TRAY) ×3 IMPLANT
PATCH VENTRAL MEDIUM 6.4 (Mesh Specialty) ×3 IMPLANT
PENCIL BUTTON HOLSTER BLD 10FT (ELECTRODE) ×3 IMPLANT
SLEEVE SCD COMPRESS KNEE MED (MISCELLANEOUS) ×3 IMPLANT
SPONGE LAP 18X18 X RAY DECT (DISPOSABLE) ×3 IMPLANT
SPONGE LAP 4X18 X RAY DECT (DISPOSABLE) ×3 IMPLANT
STAPLER VISISTAT 35W (STAPLE) ×3 IMPLANT
STRIP CLOSURE SKIN 1/2X4 (GAUZE/BANDAGES/DRESSINGS) IMPLANT
STRIP CLOSURE SKIN 1/4X4 (GAUZE/BANDAGES/DRESSINGS) IMPLANT
SUT MNCRL AB 4-0 PS2 18 (SUTURE) ×3 IMPLANT
SUT MON AB 4-0 PC3 18 (SUTURE) ×6 IMPLANT
SUT NOVA 0 T19/GS 22DT (SUTURE) IMPLANT
SUT NOVA NAB DX-16 0-1 5-0 T12 (SUTURE) ×6 IMPLANT
SUT PDS AB 0 CT 36 (SUTURE) IMPLANT
SUT PROLENE 0 CT 1 CR/8 (SUTURE) IMPLANT
SUT PROLENE 0 CT 2 (SUTURE) ×6 IMPLANT
SUT PROLENE 0 SH 30 (SUTURE) IMPLANT
SUT PROLENE 1 CT (SUTURE) IMPLANT
SUT PROLENE 2 0 CT2 30 (SUTURE) ×12 IMPLANT
SUT SILK 2 0 SH (SUTURE) ×3 IMPLANT
SUT SILK 2 0 TIES 17X18 (SUTURE) ×1
SUT SILK 2-0 18XBRD TIE BLK (SUTURE) ×2 IMPLANT
SUT SILK 3 0 SH 30 (SUTURE) IMPLANT
SUT VIC AB 2-0 CT1 27 (SUTURE) ×1
SUT VIC AB 2-0 CT1 TAPERPNT 27 (SUTURE) ×2 IMPLANT
SUT VIC AB 2-0 SH 27 (SUTURE) ×1
SUT VIC AB 2-0 SH 27XBRD (SUTURE) ×2 IMPLANT
SUT VIC AB 3-0 54X BRD REEL (SUTURE) ×2 IMPLANT
SUT VIC AB 3-0 BRD 54 (SUTURE) ×1
SUT VIC AB 3-0 FS2 27 (SUTURE) IMPLANT
SUT VIC AB 3-0 SH 27 (SUTURE) ×1
SUT VIC AB 3-0 SH 27X BRD (SUTURE) ×2 IMPLANT
SUT VICRYL 3-0 CR8 SH (SUTURE) ×3 IMPLANT
SUT VICRYL 4-0 PS2 18IN ABS (SUTURE) IMPLANT
SUT VICRYL AB 2 0 TIE (SUTURE) IMPLANT
SUT VICRYL AB 2 0 TIES (SUTURE)
SYR BULB 3OZ (MISCELLANEOUS) IMPLANT
SYR CONTROL 10ML LL (SYRINGE) ×3 IMPLANT
TOWEL OR 17X24 6PK STRL BLUE (TOWEL DISPOSABLE) ×3 IMPLANT
TOWEL OR NON WOVEN STRL DISP B (DISPOSABLE) ×3 IMPLANT
TRAY DSU PREP LF (CUSTOM PROCEDURE TRAY) ×3 IMPLANT
TUBE CONNECTING 20X1/4 (TUBING) ×3 IMPLANT
WATER STERILE IRR 1000ML POUR (IV SOLUTION) IMPLANT
YANKAUER SUCT BULB TIP NO VENT (SUCTIONS) ×3 IMPLANT

## 2011-03-17 NOTE — Anesthesia Preprocedure Evaluation (Addendum)
Anesthesia Evaluation  Patient identified by MRN, date of birth, ID band Patient awake    Reviewed: Allergy & Precautions, H&P , NPO status , Patient's Chart, lab work & pertinent test results, reviewed documented beta blocker date and time   History of Anesthesia Complications (+) DIFFICULT AIRWAY  Airway Mallampati: III TM Distance: >3 FB Neck ROM: Limited    Dental  (+) Dental Advisory Given   Pulmonary neg pulmonary ROS,          Cardiovascular neg cardio ROS     Neuro/Psych Negative Neurological ROS  Negative Psych ROS   GI/Hepatic negative GI ROS, Neg liver ROS,   Endo/Other  Negative Endocrine ROS  Renal/GU negative Renal ROS  Genitourinary negative   Musculoskeletal   Abdominal   Peds  Hematology negative hematology ROS (+)   Anesthesia Other Findings See surgeon's H&P   Reproductive/Obstetrics negative OB ROS                         Anesthesia Physical Anesthesia Plan  ASA: II  Anesthesia Plan: General   Post-op Pain Management:    Induction: Intravenous  Airway Management Planned: Oral ETT  Additional Equipment:   Intra-op Plan:   Post-operative Plan: Extubation in OR  Informed Consent: I have reviewed the patients History and Physical, chart, labs and discussed the procedure including the risks, benefits and alternatives for the proposed anesthesia with the patient or authorized representative who has indicated his/her understanding and acceptance.     Plan Discussed with: CRNA and Surgeon  Anesthesia Plan Comments:         Anesthesia Quick Evaluation

## 2011-03-17 NOTE — Interval H&P Note (Signed)
History and Physical Interval Note:  03/17/2011 7:04 AM  Cory Johnson  has presented today for surgery, with the diagnosis of right inguinal hernia and umbilical hernia  The various methods of treatment and the goals of treatment have been discussed with the patient and family. After consideration of risks, benefits and other options for treatment, the patient has consented to  Procedure(s): HERNIA REPAIR RIGHT INGUINAL ADULT HERNIA REPAIR UMBILICAL ADULT as a surgical intervention .  The patients' history has been reviewed today, patient examined today, no change in status, he is stable for surgery.  I have reviewed the patients' chart and labs.  Questions were answered to the patient's satisfaction.     [name] M 03/17/2011 7:05 AM

## 2011-03-17 NOTE — Transfer of Care (Signed)
Immediate Anesthesia Transfer of Care Note  Patient: Cory Johnson LEVELS  Procedure(s) Performed:  HERNIA REPAIR INGUINAL ADULT - right inguinal hernia repair with mesh; HERNIA REPAIR UMBILICAL ADULT - umbilical hernia repair with mesh  Patient Location: PACU  Anesthesia Type: General  Level of Consciousness: awake, alert  and patient cooperative  Airway & Oxygen Therapy: Patient Spontanous Breathing and Patient connected to face mask oxygen  Post-op Assessment: Post -op Vital signs reviewed and stable  Post vital signs: Reviewed and stable  Complications: No apparent anesthesia complications

## 2011-03-17 NOTE — Anesthesia Procedure Notes (Addendum)
Performed by: Signa Kell   Procedure Name: Intubation Date/Time: 03/17/2011 7:44 AM Performed by: Signa Kell Pre-anesthesia Checklist: Patient identified, Emergency Drugs available, Suction available and Patient being monitored Patient Re-evaluated:Patient Re-evaluated prior to inductionOxygen Delivery Method: Circle System Utilized Preoxygenation: Pre-oxygenation with 100% oxygen Intubation Type: IV induction Ventilation: Mask ventilation without difficulty Laryngoscope size: Glidescope number 4. Grade View: Grade II Tube type: Oral Number of attempts: 1 Airway Equipment and Method: stylet,  oral airway and video-laryngoscopy Placement Confirmation: ETT inserted through vocal cords under direct vision,  positive ETCO2 and breath sounds checked- equal and bilateral Secured at: 23 cm Tube secured with: Tape Dental Injury: Teeth and Oropharynx as per pre-operative assessment  Difficulty Due To: Difficult Airway- due to reduced neck mobility

## 2011-03-17 NOTE — Anesthesia Postprocedure Evaluation (Signed)
Anesthesia Post Note  Patient: Cory Johnson  Procedure(s) Performed:  HERNIA REPAIR INGUINAL ADULT - right inguinal hernia repair with mesh; HERNIA REPAIR UMBILICAL ADULT - umbilical hernia repair with mesh  Anesthesia type: General  Patient location: PACU  Post pain: Pain level controlled  Post assessment: Patient's Cardiovascular Status Stable  Last Vitals:  Filed Vitals:   03/17/11 1100  BP: 121/72  Pulse: 77  Temp:   Resp: 11    Post vital signs: Reviewed and stable  Level of consciousness: alert  Complications: No apparent anesthesia complications

## 2011-03-17 NOTE — Op Note (Signed)
**Note Cory-Identified via Obfuscation** Patient Name:           Cory Johnson   Date of Surgery:        03/17/2011  Pre op Diagnosis:      Umbilical hernia, right inguinal hernia  Post op Diagnosis:    same  Procedure:                 Repair umbilical hernia with mesh, open repair right inguinal hernia with mesh Armanda Heritage repair)  Surgeon:                     Angelia Mould. Derrell Lolling, M.D., FACS  Assistant:                      none  Operative Indications:   This is a 56 year old gentleman, height 6 feet 1, weight 243 pounds. He's had a painful area in his right groin for the past month or 2. It has never been incarcerated the pain is getting worse. He also has an umbilical hernia. On exam he has a small labral hernia which is reducible and does not extend into the scrotum. He also has a reducible umbilical hernia with a defect about 2 cm in size or greater than a hernia sac about 6 cm in size. He is brought to the operating room electively for repair of both hernias  Operative Findings:       The patient had a somewhat incarcerated umbilical hernia. The defect in the fascia was actually about 3-1/2 cm. There was incarcerated preperitoneal fat. We had to do a fair amount of undermining to get the fascia defined. In the right inguinal area he had an indirect hernia but there was no incarceration.  Procedure in Detail:          Following the induction of general endotracheal anesthesia, the patient's abdomen and genitalia were prepped and draped in sterile fashion. Intravenous antibodies were given. Surgical time out was held. 0.5% Marcaine with epinephrine was used as local infiltration anesthetic.  A transverse curved incision was made at the lower rim of the umbilicus. Dissection was carried down into subcutaneous tissue. The umbilical skin was carefully dissected off of the umbilical hernia area. We carefully undermined the tissues. We entered the hernia sac and debrided carefully from the edges of the fascia. We undermined  subcutaneous tissue off of the fascia. I felt inside and felt no other hernias in the midline above or below. I brought a 6.5 cm diameter piece of Proceed mesh to the operative field. This was implanted as an inlay mesh. I used #1 Novafil placed at  four equidistant points around the circumference of the repair. These Novafil sutures were placed down through the fascia, through the mesh and about back up to the fascia. After all 4 sutures were placed the mesh was inserted into the peritoneal space and the sutures pulled up.  This provided very good overlay of the fascia without any entrapment of omentum. The sutures were tied. It was irrigated with saline. The fascia was closed transversely with about 8 interrupted sutures of #1 Novafil. The subcutaneous tissue and umbilicus were closed back down with interrupted sutures of 3-0 Vicryl and skin closed with a running subcuticular suture of 4-0 Monocryl and Dermabond.  A transverse incision was made in the right groin. Dissection was carried down through subcutaneous tissue exposing the aponeurosis of the external oblique. The external oblique was incised in the direction of its fibers, opening  up the external inguinal ring. The external oblique was dissected off of the cord tissues and self-retaining retractors were placed. The cord structures were mobilized and encircled with a Penrose drain. Cremasteric muscle fibers were skeletonized. An indirect sac was dissected away from the cord structures. Interacts hernia sac was opened and found to be a simple sac I could palpate within the peritoneal space there was no femoral hernia and no abnormality otherwise. The indirect sac was then twisted and suture ligated at the level of the internal ring with suture ligature of 2-0 Vicryl. There was no other defect. A 3" x 6" piece of UltraPro mesh was brought  to the operative field. This was trimmed at the corners but mostly I used the entire piece of mesh. The mesh was  sutured in place with running sutures of 2-0 Prolene and interrupted mattress sutures of 2-0 Prolene. The mesh was sutured so as to generously overlap the fascia at the pubic tubercle, then along the inguinal ligament inferiorly. Medially, superiorly, and superior laterally interrupted mattress sutures were placed to secure the mesh to the muscle fascia. The mesh was incised laterally so as to wrap around the cord structures at the internal ring. The tails of the mesh were overlapped laterally and further sutures were placed to complete the repair. This provided very secure repair both medial and lateral to the internal ring but allowed a fingertip opening for the cord structures. There was no bleeding. The wound was irrigated with saline. The external oblique was closed with a running suture of 2-0 Vicryl, placing the cord structures deep to the external oblique. Scarpa's fascia was closed with 3-0 Vicryl sutures and the skin closed with running subcuticular suture of 4-0 Monocryl and Dermabond.  The patient tolerated the procedure well. There were no complications. EBL 25 cc. Counts correct.     Angelia Mould. Derrell Lolling, M.D., FACS General and Minimally Invasive Surgery Breast and Colorectal Surgery  03/17/2011 9:36 AM 2

## 2011-03-20 ENCOUNTER — Encounter (HOSPITAL_BASED_OUTPATIENT_CLINIC_OR_DEPARTMENT_OTHER): Payer: Self-pay | Admitting: General Surgery

## 2011-04-03 ENCOUNTER — Ambulatory Visit (INDEPENDENT_AMBULATORY_CARE_PROVIDER_SITE_OTHER): Payer: 59 | Admitting: General Surgery

## 2011-04-03 ENCOUNTER — Encounter (INDEPENDENT_AMBULATORY_CARE_PROVIDER_SITE_OTHER): Payer: Self-pay | Admitting: General Surgery

## 2011-04-03 VITALS — BP 134/88 | HR 73 | Temp 98.4°F | Ht 74.0 in | Wt 242.8 lb

## 2011-04-03 DIAGNOSIS — Z9889 Other specified postprocedural states: Secondary | ICD-10-CM

## 2011-04-03 NOTE — Patient Instructions (Signed)
Your umbilical incision and your right inguinal incision are healing without complications. The numbness and will get better over the next 3 months. The soreness will get better over the next 2 weeks. I advised that you take a 30 minute walk out of doors on a daily basis. No sports or lifting more than 20 pounds until after Feb. 4.   Return to see me if any new problems arise.

## 2011-04-03 NOTE — Progress Notes (Signed)
Subjective:     Patient ID: TIRON SUSKI, male   DOB: 11-01-1955, 56 y.o.   MRN: [id number]  HPI This 56 year old gentleman underwent open repair of large umbilical hernia with mesh and open repair of right inguinal hernia with mesh on January 4. He has some numbness and some swelling in his right groin but he is otherwise doing okay. He had constipation early on but that has now resolved. His appetite is normal.  Review of Systems     Objective:   Physical Exam Patient looks well and is in no distress. His wife is with him.  Abdomen obese. Soft. Umbilical incision is healing normally. Normal amount of thickening. Minimal tenderness. No sign of infection.  Genitourinary: Right groin incision is healing normally. It is down in his panniculus. I cleaned this area off. There was no unusual swelling. There was no signs of infection or drainage. I put a dry gauze bandage there as the tissues were little bit sweaty.    Assessment:     Umbilical hernia and right inguinal hernia, recovering uneventfully in the early postop period following open repair with mesh.    Plan:     Encouraged increase activity with daily ambulation out of doors.  Wound care discussed discussed. I suggested he keep a dry gauze bandage down under his panniculus for about 2 more weeks until the wound was completely dried up.  No sports or heavy lifting until after February 4.  Return to see me p.r.n.   Angelia Mould. Derrell Lolling, M.D., [facility] General and Minimally invasive Surgery Breast and Colorectal Surgery Office:   [phone] Pager:   [phone]

## 2011-04-07 ENCOUNTER — Ambulatory Visit: Payer: 59 | Admitting: Oncology

## 2011-04-10 ENCOUNTER — Encounter: Payer: Self-pay | Admitting: Oncology

## 2011-04-10 ENCOUNTER — Other Ambulatory Visit: Payer: Self-pay | Admitting: Oncology

## 2011-04-10 DIAGNOSIS — I2699 Other pulmonary embolism without acute cor pulmonale: Secondary | ICD-10-CM

## 2011-04-10 DIAGNOSIS — I82409 Acute embolism and thrombosis of unspecified deep veins of unspecified lower extremity: Secondary | ICD-10-CM

## 2011-04-11 ENCOUNTER — Telehealth: Payer: Self-pay | Admitting: Oncology

## 2011-04-11 NOTE — Telephone Encounter (Signed)
Talked to pt, gave him appt date for 04/24/11, lab and MD

## 2011-04-17 ENCOUNTER — Ambulatory Visit (INDEPENDENT_AMBULATORY_CARE_PROVIDER_SITE_OTHER): Payer: 59 | Admitting: General Surgery

## 2011-04-17 ENCOUNTER — Encounter (INDEPENDENT_AMBULATORY_CARE_PROVIDER_SITE_OTHER): Payer: Self-pay | Admitting: General Surgery

## 2011-04-17 VITALS — BP 118/66 | HR 73 | Temp 98.3°F | Ht 74.0 in | Wt 240.8 lb

## 2011-04-17 DIAGNOSIS — Z4889 Encounter for other specified surgical aftercare: Secondary | ICD-10-CM

## 2011-04-17 DIAGNOSIS — Z5189 Encounter for other specified aftercare: Secondary | ICD-10-CM

## 2011-04-17 NOTE — Progress Notes (Signed)
Subjective:     Patient ID: Cory Johnson, male   DOB: December 15, 1955, 56 y.o.   MRN: [id number]  HPI Patient is one month status post open umbilical hernia repair with mesh and right inguinal hernia with mesh by Dr. Derrell Lolling. He states approximately 3 weeks ago his wound opened up and began draining and he says it's been draining ever since. He denies any significant discomfort in the area denies any bulge. Denies any fevers or chills.  Review of Systems     Objective:   Physical Exam His umbilical incision is still together without any sign of infection. His right inguinal hernia incision has opened up and is approximately 5-6 mm in diameter at 2 different spots along the incision. It is fairly shallow and did not think that needs formal wound packing. The wound is right in the skin crease which I think is keeping the wound moist. There is no evidence of infection.    Assessment:     Status post open right inguinal hernia repair with mesh with wound dehiscence I do not see any sign of infection but his wound has opened up. I think that this should heal with time and I recommended keeping the area dry and covered with a dry gauze. I think that the wound is too shallow for formal and packing.    Plan:     A followup in  2 weeks with Dr. Derrell Lolling for wound check. In the meantime, he'll keep the wound dry with frequent changes of the gauze t.i.d.

## 2011-04-24 ENCOUNTER — Ambulatory Visit (HOSPITAL_BASED_OUTPATIENT_CLINIC_OR_DEPARTMENT_OTHER): Payer: 59 | Admitting: Oncology

## 2011-04-24 ENCOUNTER — Other Ambulatory Visit: Payer: 59 | Admitting: Lab

## 2011-04-24 VITALS — BP 129/84 | HR 65 | Temp 98.6°F | Ht 74.0 in | Wt 239.0 lb

## 2011-04-24 DIAGNOSIS — I2699 Other pulmonary embolism without acute cor pulmonale: Secondary | ICD-10-CM

## 2011-04-24 DIAGNOSIS — I82409 Acute embolism and thrombosis of unspecified deep veins of unspecified lower extremity: Secondary | ICD-10-CM

## 2011-04-24 NOTE — Progress Notes (Signed)
Followup visit for this 56 year old man I initially saw back in may of 2012 for further advice on long-term anticoagulation. He suffered a unprovoked left lower extremity DVT and bilateral pulmonary emboli in August of 2011. He took Coumadin for 6 months through March 2012. He had no obvious personal or family risk factors for clotting. He had a normal CBC and chemistry profile. He had mild persistent elevation of the-dimer at 0.67. I did a hypercoagulation evaluation which was unrevealing. Antithrombin level 79% of control (76-126), functional protein C. activity 112% (75-133) functional protein is 71% (14-782), free protein S. 58% (57-171). Negative anticardiolipin antibodies negative antibodies to beta 2 glycoprotein 1, negative lupus type anticoagulant. He tested negative for the prothrombin gene and factor V Leiden gene mutations. A subsequent repeat D. dimer level done 01/16/2011 remain borderline elevated at 0.68 normal less than 0.48 but unchanged compared with prior values. Repeat testing today is pending.  He has had no interim medical problems. He denies any dyspnea or chest pain. No calf swelling or pain.  Current medications include when necessary Tylox and Viagra  Brief exam: Blood pressure 129/84 pulse 65 regular Lungs are clear and resonant to percussion regular cardiac rhythm no murmur extremities no edema no calf tenderness  Impression: Idiopathic pulmonary embolus and left lower extremity DVT. A negative hypercoagulation evaluation.  Recommendation: I suggested that he start on baby aspirin a day. There have been 2 recent studies which show aspirin does have some prophylactic effect on the venous circulation although certainly not as good as Coumadin it can provide some risk reduction. I told him that if he has another clot in the future then we will be committed to long-term anticoagulation at that point. I did not schedule a formal followup visit. He will call me on an as-needed  basis. He will certainly need routine thromboprophylaxis around any surgery, injury, or prolonged immobilization.

## 2011-04-25 LAB — D-DIMER, QUANTITATIVE: D-Dimer, Quant: 0.75 ug/mL-FEU — ABNORMAL HIGH (ref 0.00–0.48)

## 2011-04-25 LAB — FACTOR 8 ASSAY: Coagulation Factor VIII: 78 % (ref 73–140)

## 2011-05-02 ENCOUNTER — Encounter (INDEPENDENT_AMBULATORY_CARE_PROVIDER_SITE_OTHER): Payer: Self-pay | Admitting: General Surgery

## 2011-05-02 ENCOUNTER — Ambulatory Visit (INDEPENDENT_AMBULATORY_CARE_PROVIDER_SITE_OTHER): Payer: 59 | Admitting: General Surgery

## 2011-05-02 VITALS — BP 126/78 | HR 64 | Temp 97.8°F | Resp 16 | Ht 73.0 in | Wt 240.6 lb

## 2011-05-02 DIAGNOSIS — Z9889 Other specified postprocedural states: Secondary | ICD-10-CM

## 2011-05-02 NOTE — Patient Instructions (Signed)
Both of your hernia repairs look good. There is no sign of infection or recurrence. There is no restriction on your physical activities from here on out.  The open area of your right groin wound was treated with silver nitrate today. It should heal completely within 2-4 weeks.  Return to see me if there are any further problems.

## 2011-05-02 NOTE — Progress Notes (Signed)
Subjective:     Patient ID: Cory Johnson, male   DOB: 03-May-1955, 56 y.o.   MRN: [id number]  HPI This 56 year old gentleman underwent open repair of umbilical hernia with mesh, and open repair of right inguinal hernia with mesh on March 17, 2011. He has done well and has no complaints except for a little drainage from the right groin. He notices a little bit of numbness in the incisions.  Review of Systems     Objective:   Physical Exam The patient is morbidly obese. Umbilical incision well healed nontender no recurrence. Right groin incision reveals a little bit of exuberant granulation tissue. No infection. No fluid collection. No recurrence. Penis scrotum and testes are normal. I cauterized the granulation  tissue with silver nitrate.    Assessment:     Umbilical hernia and right inguinal hernia, recovering without complication following a repair of both hernias with mesh.  Right inguinal granulation tissue, cauterized with silver nitrate.    Plan:     Wound care discussed. Because the right inguinal incision is in the crease of his panniculus, it will tend to be a little moist. I told him to keep a dry gauze bandage and change it  4 or 5 times daily. I told him to expect the granulation tissue to completely heal within 2-4 weeks.  Resume all of his activities. No restriction  Return to see me if there are any wound healing problems in the future.   Angelia Mould. Derrell Lolling, M.D., [facility] General and Minimally invasive Surgery Breast and Colorectal Surgery Office:   [phone] Pager:   [phone]

## 2011-07-20 ENCOUNTER — Ambulatory Visit (INDEPENDENT_AMBULATORY_CARE_PROVIDER_SITE_OTHER): Payer: 59 | Admitting: Family Medicine

## 2011-07-20 VITALS — BP 128/85 | HR 58 | Temp 98.9°F | Resp 16 | Ht 69.0 in | Wt 236.0 lb

## 2011-07-20 DIAGNOSIS — R031 Nonspecific low blood-pressure reading: Secondary | ICD-10-CM

## 2011-07-20 DIAGNOSIS — R6889 Other general symptoms and signs: Secondary | ICD-10-CM

## 2011-07-20 LAB — POCT CBC
Granulocyte percent: 75.3 %G (ref 37–80)
HCT, POC: 45.4 % (ref 43.5–53.7)
Hemoglobin: 14.5 g/dL (ref 14.1–18.1)
Lymph, poc: 1.3 (ref 0.6–3.4)
MCH, POC: 27 pg (ref 27–31.2)
MCHC: 31.9 g/dL (ref 31.8–35.4)
MCV: 84.3 fL (ref 80–97)
MID (cbc): 0.4 (ref 0–0.9)
MPV: 7.5 fL (ref 0–99.8)
POC Granulocyte: 5.1 (ref 2–6.9)
POC LYMPH PERCENT: 18.6 %L (ref 10–50)
POC MID %: 6.1 %M (ref 0–12)
Platelet Count, POC: 276 10*3/uL (ref 142–424)
RBC: 5.38 M/uL (ref 4.69–6.13)
RDW, POC: 15.9 %
WBC: 6.8 10*3/uL (ref 4.6–10.2)

## 2011-07-20 NOTE — Progress Notes (Signed)
S: Patient has had extreemly cold hands past few years.  Feet not cold.  No meds except the hydrocodone for his neck pain. No discoloration.  Also BP runs 120/70 at times, concerns him.    O: HEENT nl.  Chest CTA Heart RRR Ext normal. Hands cold to touch  A: Cold intolerance Normal BP/concerns   Reassurance.  Cbc, Cmet, tsh

## 2011-07-20 NOTE — Patient Instructions (Signed)
Do not worry about the blood pressure unless you are getting light headed.  We will let you know the results of your labs.

## 2011-07-21 LAB — COMPREHENSIVE METABOLIC PANEL
ALT: 13 U/L (ref 0–53)
AST: 15 U/L (ref 0–37)
Albumin: 4.1 g/dL (ref 3.5–5.2)
Alkaline Phosphatase: 84 U/L (ref 39–117)
BUN: 10 mg/dL (ref 6–23)
CO2: 28 mEq/L (ref 19–32)
Calcium: 9 mg/dL (ref 8.4–10.5)
Chloride: 103 mEq/L (ref 96–112)
Creat: 0.72 mg/dL (ref 0.50–1.35)
Glucose, Bld: 87 mg/dL (ref 70–99)
Potassium: 4.1 mEq/L (ref 3.5–5.3)
Sodium: 140 mEq/L (ref 135–145)
Total Bilirubin: 0.6 mg/dL (ref 0.3–1.2)
Total Protein: 7.4 g/dL (ref 6.0–8.3)

## 2011-07-21 LAB — TSH: TSH: 1.303 u[IU]/mL (ref 0.350–4.500)

## 2012-03-13 ENCOUNTER — Ambulatory Visit (INDEPENDENT_AMBULATORY_CARE_PROVIDER_SITE_OTHER): Payer: 59 | Admitting: Emergency Medicine

## 2012-03-13 VITALS — BP 117/78 | HR 74 | Temp 98.3°F | Resp 17 | Ht 71.0 in | Wt 230.0 lb

## 2012-03-13 DIAGNOSIS — I7300 Raynaud's syndrome without gangrene: Secondary | ICD-10-CM

## 2012-03-13 DIAGNOSIS — IMO0002 Reserved for concepts with insufficient information to code with codable children: Secondary | ICD-10-CM

## 2012-03-13 DIAGNOSIS — S86919A Strain of unspecified muscle(s) and tendon(s) at lower leg level, unspecified leg, initial encounter: Secondary | ICD-10-CM

## 2012-03-13 MED ORDER — NAPROXEN SODIUM 550 MG PO TABS: 550.0000 mg | ORAL_TABLET | Freq: Two times a day (BID) | ORAL | Status: AC

## 2012-03-13 NOTE — Progress Notes (Signed)
[facility] [address] [place] [address] [phone]  Date:  03/13/2012   Name:  Cory Johnson   DOB:  09-08-1955   MRN:  [id number]  PCP:  Abbe Amsterdam, MD    Chief Complaint: Leg Pain   History of Present Illness:  Cory Johnson is a 57 y.o. very pleasant male patient who presents with the following:  History of DVT and PE.  Has pain in posterior right calf that he is concerned may be a recurrent DVT.  No history of injury or risk for DVT; travel, immobilization, surgery, or coagulopathy.  Has unusual sensitivity to cold exposure marked by intense cold in his hands and pallor in his fingers.  Patient Active Problem List  Diagnosis  . Right inguinal hernia  . Umbilical hernia  . Acute pulmonary embolus  . DVT of lower extremity (deep venous thrombosis)    Past Medical History  Diagnosis Date  . Right inguinal hernia 02/2011  . Arthritis     neck - limited range of motion; back, shoulders  . Umbilical hernia 02/2011  . Chronic back pain   . Difficult intubation   . Acute pulmonary embolus 11/07/2009  . DVT of lower extremity (deep venous thrombosis) 11/07/2009  . Seizures     Past Surgical History  Procedure Date  . Knee surgery 1977    left  . Inguinal hernia repair 03/17/2011    Procedure: HERNIA REPAIR INGUINAL ADULT;  Surgeon: Ernestene Mention, MD;  Location: [facility];  Service: General;  Laterality: Right;  right inguinal hernia repair with mesh  . Umbilical hernia repair 03/17/2011    Procedure: HERNIA REPAIR UMBILICAL ADULT;  Surgeon: Ernestene Mention, MD;  Location: [facility];  Service: General;  Laterality: N/A;  umbilical hernia repair with mesh  . Hernia repair     History  Substance Use Topics  . Smoking status: Never Smoker   . Smokeless tobacco: Never Used  . Alcohol Use: No    History reviewed. No pertinent family history.  No Known Allergies  Medication list has been  reviewed and updated.  Current Outpatient Prescriptions on File Prior to Visit  Medication Sig Dispense Refill  . HYDROcodone-acetaminophen (NORCO) 10-325 MG per tablet       . VIAGRA 100 MG tablet         Review of Systems:  As per HPI, otherwise negative.    Physical Examination: Filed Vitals:   03/13/12 1418  BP: 117/78  Pulse: 74  Temp: 98.3 F (36.8 C)  Resp: 17   Filed Vitals:   03/13/12 1418  Height: 5\' 11"  (1.803 m)  Weight: 230 lb (104.327 kg)   Body mass index is 32.08 kg/(m^2). Ideal Body Weight: Weight in (lb) to have BMI = 25: 178.9    GEN: WDWN, NAD, Non-toxic, Alert & Oriented x 3 HEENT: Atraumatic, Normocephalic.  Ears and Nose: No external deformity. EXTR: No clubbing/cyanosis/edema NEURO: Normal gait.  PSYCH: Normally interactive. Conversant. Not depressed or anxious appearing.  Calm demeanor.  HANDS:  Decreased capillary refill and cold hands.  Pallid.  Responded to warm water immersion Lower leg:  Right calf soft and tender.  No ecchymosis or erythema or edema.    Assessment and Plan: Strain calf Raynaud's phenomenon Anaprox Follow up as needed Local heat to calf with elevation  Carmelina Dane, MD

## 2012-03-13 NOTE — Patient Instructions (Signed)
Raynaud's Syndrome  Raynaud's Syndrome is a disorder of the blood vessels in your hands and feet. It occurs when small arteries of the arms/hands or legs/feet become sensitive to cold or emotional upset. This causes the arteries to constrict, or narrow, and reduces blood flow to the area. The color in the fingers or toes changes from white to bluish to red and this is not usually painful. There may be numbness and tingling. Sores on the skin (ulcers) can form. Symptoms are usually relieved by warming.  HOME CARE INSTRUCTIONS     Avoid exposure to cold. Keep your whole body warm and dry. Dress in layers. Wear mittens or gloves when handling ice or frozen food and when outdoors. Use holders for glasses or cans containing cold drinks. If possible, stay indoors during cold weather.   Limit your use of caffeine. Switch to decaffeinated coffee, tea, and soda pop. Avoid chocolate.   Avoid smoking or being around cigarette smoke. Smoke will make symptoms worse.   Wear loose fitting socks and comfortable, roomy shoes.   Avoid vibrating tools and machinery.   If possible, avoid stressful and emotional situations. Exercise, meditation and yoga may help you cope with stress. Biofeedback may be useful.   Ask your caregiver about medicine (calcium channel blockers) that may control Raynaud's phenomena.  SEEK MEDICAL CARE IF:     Your discomfort becomes worse, despite conservative treatment.   You develop sores on your fingers and toes that do not heal.  Document Released: 02/25/2000 Document Revised: 05/22/2011 Document Reviewed: 03/03/2008  [employer] Patient Information 2013 [employer].

## 2012-03-14 NOTE — Progress Notes (Signed)
Reviewed and agree.

## 2012-06-26 ENCOUNTER — Ambulatory Visit (INDEPENDENT_AMBULATORY_CARE_PROVIDER_SITE_OTHER): Payer: 59 | Admitting: Neurology

## 2012-06-26 ENCOUNTER — Encounter: Payer: Self-pay | Admitting: Neurology

## 2012-06-26 VITALS — BP 123/72 | HR 64 | Wt 234.0 lb

## 2012-06-26 DIAGNOSIS — M1990 Unspecified osteoarthritis, unspecified site: Secondary | ICD-10-CM | POA: Insufficient documentation

## 2012-06-26 DIAGNOSIS — R569 Unspecified convulsions: Secondary | ICD-10-CM

## 2012-06-26 DIAGNOSIS — G253 Myoclonus: Secondary | ICD-10-CM | POA: Insufficient documentation

## 2012-06-26 DIAGNOSIS — M129 Arthropathy, unspecified: Secondary | ICD-10-CM

## 2012-06-26 MED ORDER — ROPINIROLE HCL ER 2 MG PO TB24: 4.0000 mg | ORAL_TABLET | Freq: Every day | ORAL | Status: DC

## 2012-06-26 MED ORDER — CLONAZEPAM 0.5 MG PO TABS: 0.5000 mg | ORAL_TABLET | Freq: Every day | ORAL | Status: DC

## 2012-06-26 NOTE — Progress Notes (Signed)
HPI: 57 year-old right-handed [place] American male with his wife.   He presented with frequent nocturnal jerking movements.  He described that he had frequent transient jerking movements of his body while he was falling into sleep,  was able to quickly drifting back asleep again.   He also also has excessive movement  during the night sleep, kicking, moving his limbs, occasionally talking, cause a lot of complains from his wife.  he often dreamed of playing basketball.   He denied excessive snoring, or stop breathing during the the snoring.  But he does complain excessive daytime fatigue, especially during the evening time, he would easily doze off sitting at the  meetings or watching TV.  During the daytime,  he is highly functional, denies dozing off while stopping at the traffic light.  He denies walking difficulty, loss of smell, extremity tremor, or memory loss.  He has fixed cervical fusion with limited range of motion of his neck.  He had sleep study in October 13, which indeed demonstrate REM sleep disorder. There is no sleep apnea.  He was treated with low-dose clonazepam 0 point 5 mg every night, which does help his excessive movement at nighttime, last clinical visit was in February 2013, he has quit clonazepam about a few months ago, worsening nighttime movements, in addition, over the past few months, he was noted to have frequent body jerking movement during the daytime, he denies gait difficulty, works out regularly, he has decreased his sense of smell, he denies memory loss  Review of Systems  Out of a complete 14 system review, the patient complains of only the following symptoms, and all other reviewed systems are negative.   Constitutional:   N/A Cardiovascular:  N/A Ear/Nose/Throat:  N/A Skin: N/A Eyes: N/A Respiratory: N/A Gastroitestinal: N/A    Hematology/Lymphatic:  N/A Endocrine:  N/A Musculoskeletal: joints pain Allergy/Immunology: N/A Neurological:  N/A Psychiatric:    N/A  Physical Exam  Neck: supple no carotid bruits Respiratory: clear to auscultation bilaterally Cardiovascular: regular rate rhythm  Neurologic Exam  Mental Status: pleasant, awake, alert, cooperative to history, talking, and casual conversation. Profound anterocolis, Cranial Nerves: CN II-XII pupils were equal round reactive to light.  Extraocular movements were full.  Visual fields were full on confrontational test.  Facial sensation and strength were normal.  Hearing was intact to finger rubbing bilaterally.  Uvula tongue were midline.   shoulder shrugging were normal and symmetric. His neck stay in fixed anterocollis, with limited range of motion. Tongue protrusion into the cheeks strength were normal.  Motor: Normal tone, bulk, and strength. Sensory: Normal to light touch Coordination: Normal finger-to-nose, heel-to-shin.  There was no dysmetria noticed. Gait and Station: profound antercollis, moderate stride, smooth turning. Reflexes: Deep tendon reflexes: Biceps: 2/2, Brachioradialis: 2/2, Triceps: 2/2, Pateller: 2/2, Achilles: 2/2.  Plantar responses are flexor.   Assessment and Plan: 57 year old male,  with REM sleep disorder,  cervical degenerative disc disease, chronic neck pain, limited range of motion of his neck muscles,  anterocollis.  1. Add on requip xr 2mg  titrating to 4 mg qhs 2. keep clonazepam  0.5 mg every night  3. return to clinic in 3 months 4. He has no significant parkinsonian features, I may order MRI brain for further evaluation, REM sleep disorder usually proceed CNS degenerative disorders.

## 2012-08-20 ENCOUNTER — Telehealth: Payer: Self-pay | Admitting: *Deleted

## 2012-08-20 ENCOUNTER — Telehealth: Payer: Self-pay

## 2012-08-20 ENCOUNTER — Ambulatory Visit: Payer: Self-pay

## 2012-08-20 ENCOUNTER — Ambulatory Visit (INDEPENDENT_AMBULATORY_CARE_PROVIDER_SITE_OTHER): Payer: 59 | Admitting: Family Medicine

## 2012-08-20 ENCOUNTER — Ambulatory Visit (HOSPITAL_BASED_OUTPATIENT_CLINIC_OR_DEPARTMENT_OTHER)
Admission: RE | Admit: 2012-08-20 | Discharge: 2012-08-20 | Disposition: A | Discharge status: Home / Self Care | Payer: 59 | Source: Ambulatory Visit | Attending: Family Medicine | Admitting: Family Medicine

## 2012-08-20 VITALS — BP 108/68 | HR 79 | Temp 97.9°F | Resp 18 | Ht 75.0 in | Wt 227.0 lb

## 2012-08-20 DIAGNOSIS — H47233 Glaucomatous optic atrophy, bilateral: Secondary | ICD-10-CM

## 2012-08-20 DIAGNOSIS — Z86711 Personal history of pulmonary embolism: Secondary | ICD-10-CM | POA: Insufficient documentation

## 2012-08-20 DIAGNOSIS — Z0000 Encounter for general adult medical examination without abnormal findings: Secondary | ICD-10-CM

## 2012-08-20 DIAGNOSIS — Z86718 Personal history of other venous thrombosis and embolism: Secondary | ICD-10-CM | POA: Insufficient documentation

## 2012-08-20 DIAGNOSIS — H47239 Glaucomatous optic atrophy, unspecified eye: Secondary | ICD-10-CM

## 2012-08-20 DIAGNOSIS — M79609 Pain in unspecified limb: Secondary | ICD-10-CM | POA: Insufficient documentation

## 2012-08-20 DIAGNOSIS — M7989 Other specified soft tissue disorders: Secondary | ICD-10-CM

## 2012-08-20 LAB — POCT URINALYSIS DIPSTICK
Bilirubin, UA: NEGATIVE
Glucose, UA: NEGATIVE
Ketones, UA: NEGATIVE
Leukocytes, UA: NEGATIVE
Nitrite, UA: NEGATIVE
Protein, UA: NEGATIVE
Spec Grav, UA: 1.025
Urobilinogen, UA: 0.2
pH, UA: 5.5

## 2012-08-20 LAB — POCT CBC
Granulocyte percent: 77.1 %G (ref 37–80)
HCT, POC: 48.7 % (ref 43.5–53.7)
Hemoglobin: 14.7 g/dL (ref 14.1–18.1)
Lymph, poc: 1.5 (ref 0.6–3.4)
MCH, POC: 27.4 pg (ref 27–31.2)
MCHC: 30.2 g/dL — AB (ref 31.8–35.4)
MCV: 90.9 fL (ref 80–97)
MID (cbc): 0.5 (ref 0–0.9)
MPV: 7.5 fL (ref 0–99.8)
POC Granulocyte: 6.8 (ref 2–6.9)
POC LYMPH PERCENT: 16.8 %L (ref 10–50)
POC MID %: 6.1 %M (ref 0–12)
Platelet Count, POC: 238 10*3/uL (ref 142–424)
RBC: 5.36 M/uL (ref 4.69–6.13)
RDW, POC: 16.4 %
WBC: 8.8 10*3/uL (ref 4.6–10.2)

## 2012-08-20 LAB — D-DIMER, QUANTITATIVE: D-Dimer, Quant: 1.61 ug/mL-FEU — ABNORMAL HIGH (ref 0.00–0.48)

## 2012-08-20 LAB — IFOBT (OCCULT BLOOD): IFOBT: NEGATIVE

## 2012-08-20 MED ORDER — SILDENAFIL CITRATE 100 MG PO TABS: 100.0000 mg | ORAL_TABLET | ORAL | Status: DC | PRN

## 2012-08-20 NOTE — Telephone Encounter (Signed)
PATIENT SAW DR. Milus Johnson TODAY FOR A COMPLETE PHYSICAL. HE FORGOT TO ASK HIM TO REFILL HIS PRESCRIPTION FOR VIAGRA 100MG  TABLET. PLEASE CALL HIM WHEN IT HAS BEEN DONE. BEST PHONE [phone] (CELL)    PHARMACY CHOICE IS:     DISREGARD MESSAGE: DR. Dareen Piano OUT OF THE ROOM. MR. Cory Johnson WAS GIVEN HIS PRESCRIPTION.   MBC

## 2012-08-20 NOTE — Telephone Encounter (Signed)
lmom to cb.  Lab results for D-Dimer came back elevated and Heather spoke with Dr. Milus Glazier and he would like for pt to have a venous doppler of right leg.

## 2012-08-20 NOTE — Progress Notes (Signed)
Patient ID: Cory Johnson MRN: [id number], DOB: Jan 26, 1956 57 y.o. Date of Encounter: 08/20/2012, 12:04 PM  Primary Physician: Abbe Amsterdam, MD  Chief Complaint: Physical (CPE)  HPI: 57 y.o. y/o male with history noted below here for CPE.  Doing well. Retired Research scientist (medical).  Patient has chronic neck arthritis and now has an immobile cervical spine which has been evaluated and found to have no treatment available. Patient had a history of pulmonary emboli following a DVT in his right leg. This was worked up with multiple clotting studies which were all negative. He finished his Coumadin a year ago and had no further leg swelling, chest pain or shortness of breath.  Review of Systems: Consitutional: No fever, chills, fatigue, night sweats, lymphadenopathy, or weight changes. Eyes: No visual changes, eye redness, or discharge. ENT/Mouth: Ears: No otalgia, tinnitus, hearing loss, discharge. Nose: No congestion, rhinorrhea, sinus pain, or epistaxis. Throat: No sore throat, post nasal drip, or teeth pain. Cardiovascular: No CP, palpitations, diaphoresis, DOE, edema, orthopnea, PND. Respiratory: No cough, hemoptysis, SOB, or wheezing. Gastrointestinal: No anorexia, dysphagia, reflux, pain, nausea, vomiting, hematemesis, diarrhea, constipation, BRBPR, or melena. Genitourinary: No dysuria, frequency, urgency, hematuria, incontinence, nocturia, decreased urinary stream, discharge, impotence, or testicular pain/masses. Musculoskeletal: No decreased ROM, myalgias,  joint swelling, or weakness. Neck is very stiff and patient needs to turn his chest to look sideways. Skin: No rash, erythema, lesion changes, pain, warmth, jaundice, or pruritis. Neurological: No headache, dizziness, syncope, seizures, tremors, memory loss, coordination problems, or paresthesias. Psychological: No anxiety, depression, hallucinations, SI/HI. Endocrine: No fatigue, polydipsia, polyphagia, polyuria, or  known diabetes. All other systems were reviewed and are otherwise negative.  Past Medical History  Diagnosis Date  . Right inguinal hernia 02/2011  . Arthritis     neck - limited range of motion; back, shoulders  . Umbilical hernia 02/2011  . Chronic back pain   . Difficult intubation   . Acute pulmonary embolus 11/07/2009  . DVT of lower extremity (deep venous thrombosis) 11/07/2009  . Seizures      Past Surgical History  Procedure Laterality Date  . Knee surgery  1977    left  . Inguinal hernia repair  03/17/2011    Procedure: HERNIA REPAIR INGUINAL ADULT;  Surgeon: Ernestene Mention, MD;  Location: [facility];  Service: General;  Laterality: Right;  right inguinal hernia repair with mesh  . Umbilical hernia repair  03/17/2011    Procedure: HERNIA REPAIR UMBILICAL ADULT;  Surgeon: Ernestene Mention, MD;  Location: [facility];  Service: General;  Laterality: N/A;  umbilical hernia repair with mesh  . Hernia repair      Home Meds:  Prior to Admission medications   Medication Sig Start Date End Date Taking? Authorizing Provider  clonazePAM (KLONOPIN) 0.5 MG tablet Take 0.5 mg by mouth 2 (two) times daily as needed for anxiety.   Yes Historical Provider, MD  HYDROcodone-acetaminophen [facility]) 10-325 MG per tablet  03/09/11  Yes Historical Provider, MD  naproxen sodium (ANAPROX DS) 550 MG tablet Take 1 tablet (550 mg total) by mouth 2 (two) times daily with a meal. 03/13/12 03/13/13  Phillips Odor, MD  rOPINIRole (REQUIP XL) 2 MG 24 hr tablet Take 2 tablets (4 mg total) by mouth at bedtime. One po qhs xone week, then 2 tabs po qhs 06/26/12   Levert Feinstein, MD  VIAGRA 100 MG tablet  03/26/11   Historical Provider, MD    Allergies: No Known Allergies  History  Social History  . Marital Status: Married    Spouse Name: N/A    Number of Children: N/A  . Years of Education: N/A   Occupational History  . Not on file.   Social History Main Topics  . Smoking  status: Never Smoker   . Smokeless tobacco: Never Used  . Alcohol Use: No  . Drug Use: No  . Sexually Active: Yes    Birth Control/ Protection: Surgical   Other Topics Concern  . Not on file   Social History Narrative  . No narrative on file    No family history on file.  Physical Exam: Blood pressure 108/68, pulse 79, temperature 97.9 F (36.6 C), temperature source Oral, resp. rate 18, height 6\' 3"  (1.905 m), weight 227 lb (102.967 kg).  General: Well developed, well nourished, in no acute distress. HEENT: Normocephalic, atraumatic. Conjunctiva pink, sclera non-icteric. Pupils 2 mm constricting to 1 mm, round, regular, and equally reactive to light and accomodation. EOMI. Internal auditory canal clear. TMs with good cone of light and without pathology. Nasal mucosa pink. Nares are without discharge. No sinus tenderness. Oral mucosa pink. Dentition fair. Pharynx without exudate. Bilateral cerumen impaction. Marked optic cupping bilaterally.  Neck: Supple. Trachea midline. No thyromegaly. Full ROM. No lymphadenopathy. Lungs: Clear to auscultation bilaterally without wheezes, rales, or rhonchi. Breathing is of normal effort and unlabored. Cardiovascular: RRR with S1 S2. No murmurs, rubs, or gallops appreciated. Distal pulses 2+ symmetrically. No carotid or abdominal bruits Abdomen: Soft, non-tender, non-distended with normoactive bowel sounds. No hepatosplenomegaly or masses. No rebound/guarding. No CVA tenderness. Without hernias.  Rectal: No external hemorrhoids or fissures. Rectal vault without masses.  Genitourinary:  circumcised male. No penile lesions. Testes descended bilaterally, and smooth without tenderness or masses.  Musculoskeletal: Full range of motion and 5/5 strength throughout. Without swelling, atrophy, tenderness, crepitus, or warmth. Extremities without clubbing, cyanosis, or edema. Calves supple. Skin: Warm and moist without erythema, ecchymosis, wounds, or  rash. Neuro: A+Ox3. CN II-XII grossly intact. Moves all extremities spontaneously. Full sensation throughout. Normal gait. DTR 2+ throughout upper and lower extremities. Finger to nose intact. Patient has marked kyphotic deformity of neck with head bowed forward. He is unable to rotate neck laterally either way. Psych:  Responds to questions appropriately with a normal affect.    Assessment/Plan:  57 y.o. y/o  male here for CPE -  Signed, Elvina Sidle, MD 08/20/2012 12:04 PM

## 2012-08-20 NOTE — Addendum Note (Signed)
Addended by: Thelma Barge D on: 08/20/2012 07:29 PM   Modules accepted: Orders

## 2012-08-20 NOTE — Addendum Note (Signed)
Addended by: Thelma Barge D on: 08/20/2012 04:20 PM   Modules accepted: Orders

## 2012-08-20 NOTE — Telephone Encounter (Signed)
Pt came into office and stated that his ins will pay for 5 viagra at a time.  rx fixed for 5 and pt sent to [facility] for doppler.

## 2012-08-20 NOTE — Telephone Encounter (Signed)
No evidence of DVT.  lmom letting pt know.

## 2012-08-20 NOTE — Addendum Note (Signed)
Addended by: Thelma Barge D on: 08/20/2012 07:26 PM   Modules accepted: Orders

## 2012-08-21 ENCOUNTER — Telehealth: Payer: Self-pay

## 2012-08-21 LAB — PSA: PSA: 1.43 ng/mL (ref <=4.00)

## 2012-08-21 LAB — COMPREHENSIVE METABOLIC PANEL
ALT: 19 U/L (ref 0–53)
AST: 15 U/L (ref 0–37)
Albumin: 3.9 g/dL (ref 3.5–5.2)
Alkaline Phosphatase: 84 U/L (ref 39–117)
BUN: 12 mg/dL (ref 6–23)
CO2: 30 mEq/L (ref 19–32)
Calcium: 9.1 mg/dL (ref 8.4–10.5)
Chloride: 98 mEq/L (ref 96–112)
Creat: 0.8 mg/dL (ref 0.50–1.35)
Glucose, Bld: 79 mg/dL (ref 70–99)
Potassium: 4.1 mEq/L (ref 3.5–5.3)
Sodium: 138 mEq/L (ref 135–145)
Total Bilirubin: 0.7 mg/dL (ref 0.3–1.2)
Total Protein: 7.8 g/dL (ref 6.0–8.3)

## 2012-08-21 LAB — LIPID PANEL
Cholesterol: 170 mg/dL (ref 0–200)
HDL: 62 mg/dL (ref >39)
LDL Cholesterol: 91 mg/dL (ref 0–99)
Total CHOL/HDL Ratio: 2.7 Ratio
Triglycerides: 86 mg/dL (ref <150)
VLDL: 17 mg/dL (ref 0–40)

## 2012-08-21 NOTE — Telephone Encounter (Signed)
Pt called concerning US done yesterday. Spoke w/Dr L and then advised pt that the [place] was neg for DVT/clots and we are still waiting for results of the labs. Advised him we will be back in touch when labs have resulted.

## 2012-12-26 ENCOUNTER — Ambulatory Visit: Payer: 59 | Admitting: Neurology

## 2013-04-03 ENCOUNTER — Ambulatory Visit: Payer: 59 | Admitting: Neurology

## 2013-08-18 DIAGNOSIS — M549 Dorsalgia, unspecified: Secondary | ICD-10-CM | POA: Insufficient documentation

## 2013-09-02 ENCOUNTER — Encounter: Payer: Self-pay | Admitting: Family Medicine

## 2013-09-02 DIAGNOSIS — M545 Low back pain: Secondary | ICD-10-CM

## 2013-12-04 ENCOUNTER — Ambulatory Visit (INDEPENDENT_AMBULATORY_CARE_PROVIDER_SITE_OTHER): Payer: 59 | Admitting: Emergency Medicine

## 2013-12-04 VITALS — BP 126/98 | HR 63 | Temp 98.1°F | Resp 16 | Ht 70.0 in | Wt 222.6 lb

## 2013-12-04 DIAGNOSIS — R259 Unspecified abnormal involuntary movements: Secondary | ICD-10-CM

## 2013-12-04 DIAGNOSIS — Z0000 Encounter for general adult medical examination without abnormal findings: Secondary | ICD-10-CM

## 2013-12-04 DIAGNOSIS — R251 Tremor, unspecified: Secondary | ICD-10-CM

## 2013-12-04 LAB — POCT URINALYSIS DIPSTICK
Bilirubin, UA: NEGATIVE
Glucose, UA: NEGATIVE
Ketones, UA: NEGATIVE
Leukocytes, UA: NEGATIVE
Nitrite, UA: NEGATIVE
Protein, UA: NEGATIVE
Spec Grav, UA: 1.025
Urobilinogen, UA: 0.2
pH, UA: 5.5

## 2013-12-04 LAB — POCT CBC
Granulocyte percent: 76.5 %G (ref 37–80)
HCT, POC: 48.8 % (ref 43.5–53.7)
Hemoglobin: 15.2 g/dL (ref 14.1–18.1)
Lymph, poc: 1.6 (ref 0.6–3.4)
MCH, POC: 27.2 pg (ref 27–31.2)
MCHC: 31.2 g/dL — AB (ref 31.8–35.4)
MCV: 87.4 fL (ref 80–97)
MID (cbc): 0.3 (ref 0–0.9)
MPV: 7 fL (ref 0–99.8)
POC Granulocyte: 6.1 (ref 2–6.9)
POC LYMPH PERCENT: 19.5 %L (ref 10–50)
POC MID %: 4 %M (ref 0–12)
Platelet Count, POC: 207 10*3/uL (ref 142–424)
RBC: 5.59 M/uL (ref 4.69–6.13)
RDW, POC: 15.5 %
WBC: 8 10*3/uL (ref 4.6–10.2)

## 2013-12-04 NOTE — Progress Notes (Signed)
[facility] [address] [place] [address] [phone]  Date:  12/04/2013   Name:  Cory Johnson   DOB:  07-Aug-1955   MRN:  [id number]  PCP:  Abbe Amsterdam, MD    Chief Complaint: Annual Exam   History of Present Illness:  Cory Johnson is a 58 y.o. very pleasant male patient who presents with the following:  Patient has chronic neck arthritis and now has an immobile cervical spine which has been evaluated and found to have no treatment available.  Patient had a history of pulmonary emboli following a DVT in his right leg. This was worked up with multiple clotting studies which were all negative.  He finished his Coumadin two years ago and had no further leg swelling, chest pain or shortness of breath. His neck "fusion" is worse spontatneously no radiating pain or neuro symptoms Has new resting tremor that is intermittent in nature No improvement with over the counter medications or other home remedies. Denies other complaint or health concern today.    Patient Active Problem List   Diagnosis Date Noted  . Back pain 08/18/2013  . Arthritis   . Seizures   . Right inguinal hernia 03/17/2011  . Umbilical hernia 03/17/2011  . Acute pulmonary embolus 11/07/2009  . DVT of lower extremity (deep venous thrombosis) 11/07/2009    Past Medical History  Diagnosis Date  . Right inguinal hernia 02/2011  . Arthritis     neck - limited range of motion; back, shoulders  . Umbilical hernia 02/2011  . Chronic back pain   . Difficult intubation   . Acute pulmonary embolus 11/07/2009  . DVT of lower extremity (deep venous thrombosis) 11/07/2009  . Seizures     Past Surgical History  Procedure Laterality Date  . Knee surgery  1977    left  . Inguinal hernia repair  03/17/2011    Procedure: HERNIA REPAIR INGUINAL ADULT;  Surgeon: Ernestene Mention, MD;  Location: [facility];  Service: General;  Laterality: Right;  right inguinal hernia  repair with mesh  . Umbilical hernia repair  03/17/2011    Procedure: HERNIA REPAIR UMBILICAL ADULT;  Surgeon: Ernestene Mention, MD;  Location: [facility];  Service: General;  Laterality: N/A;  umbilical hernia repair with mesh  . Hernia repair      History  Substance Use Topics  . Smoking status: Never Smoker   . Smokeless tobacco: Never Used  . Alcohol Use: No    History reviewed. No pertinent family history.  No Known Allergies  Medication list has been reviewed and updated.  Current Outpatient Prescriptions on File Prior to Visit  Medication Sig Dispense Refill  . HYDROcodone-acetaminophen (NORCO) 10-325 MG per tablet       . clonazePAM (KLONOPIN) 0.5 MG tablet Take 0.5 mg by mouth 2 (two) times daily as needed for anxiety.      Marland Kitchen rOPINIRole (REQUIP XL) 2 MG 24 hr tablet Take 2 tablets (4 mg total) by mouth at bedtime. One po qhs xone week, then 2 tabs po qhs  60 tablet  12  . sildenafil (VIAGRA) 100 MG tablet Take 1 tablet (100 mg total) by mouth as needed for erectile dysfunction.  5 tablet  12   No current facility-administered medications on file prior to visit.    Review of Systems:  As per HPI, otherwise negative.    Physical Examination: Filed Vitals:   12/04/13 1330  BP: 126/98  Pulse: 63  Temp: 98.1 F (36.7 C)  Resp: 16   Filed Vitals:   12/04/13 1330  Height:  (1.778 m)  Weight: 222 lb 9.6 oz (100.971 kg)   Body mass index is 31.94 kg/(m^2). Ideal Body Weight: Weight in (lb) to have BMI = 25: 173.9  GEN: WDWN, NAD, Non-toxic, A & O x 3 NECK:  Inclined forward and not able extend neck or rotate. HEENT: Atraumatic, Normocephalic. Neck supple. No masses, No LAD. Ears and Nose: No external deformity. CV: RRR, No M/G/R. No JVD. No thrill. No extra heart sounds. PULM: CTA B, no wheezes, crackles, rhonchi. No retractions. No resp. distress. No accessory muscle use. ABD: S, NT, ND, +BS. No rebound. No HSM. EXTR: No c/c/e NEURO  Normal gait.  PSYCH: Normally interactive. Conversant. Not depressed or anxious appearing.  Calm demeanor.    Assessment and Plan: Wellness examination Tremor Refer neurology Labs pending.  Signed,  Phillips Odor, MD

## 2013-12-05 LAB — COMPREHENSIVE METABOLIC PANEL
ALT: 11 U/L (ref 0–53)
AST: 13 U/L (ref 0–37)
Albumin: 4.1 g/dL (ref 3.5–5.2)
Alkaline Phosphatase: 84 U/L (ref 39–117)
BUN: 10 mg/dL (ref 6–23)
CO2: 30 mEq/L (ref 19–32)
Calcium: 9.6 mg/dL (ref 8.4–10.5)
Chloride: 101 mEq/L (ref 96–112)
Creat: 0.82 mg/dL (ref 0.50–1.35)
Glucose, Bld: 82 mg/dL (ref 70–99)
Potassium: 4.3 mEq/L (ref 3.5–5.3)
Sodium: 138 mEq/L (ref 135–145)
Total Bilirubin: 0.7 mg/dL (ref 0.2–1.2)
Total Protein: 8.1 g/dL (ref 6.0–8.3)

## 2013-12-05 LAB — LIPID PANEL
Cholesterol: 183 mg/dL (ref 0–200)
HDL: 55 mg/dL (ref >39)
LDL Cholesterol: 115 mg/dL — ABNORMAL HIGH (ref 0–99)
Total CHOL/HDL Ratio: 3.3 Ratio
Triglycerides: 65 mg/dL (ref <150)
VLDL: 13 mg/dL (ref 0–40)

## 2013-12-05 LAB — PSA: PSA: 2.32 ng/mL (ref <=4.00)

## 2013-12-12 ENCOUNTER — Telehealth: Payer: Self-pay

## 2013-12-12 NOTE — Telephone Encounter (Signed)
Called patient and left him a message to call office back to schedule follow up appt last seen 2014.

## 2013-12-12 NOTE — Telephone Encounter (Signed)
Called and spoke to patient he is coming to see Dr.Yan Oct 7 th .

## 2013-12-17 ENCOUNTER — Encounter: Payer: Self-pay | Admitting: Neurology

## 2013-12-17 ENCOUNTER — Encounter (INDEPENDENT_AMBULATORY_CARE_PROVIDER_SITE_OTHER): Payer: Self-pay

## 2013-12-17 ENCOUNTER — Ambulatory Visit (INDEPENDENT_AMBULATORY_CARE_PROVIDER_SITE_OTHER): Payer: 59 | Admitting: Neurology

## 2013-12-17 VITALS — BP 117/77 | HR 68 | Ht 73.0 in | Wt 234.0 lb

## 2013-12-17 DIAGNOSIS — I2699 Other pulmonary embolism without acute cor pulmonale: Secondary | ICD-10-CM

## 2013-12-17 DIAGNOSIS — I82401 Acute embolism and thrombosis of unspecified deep veins of right lower extremity: Secondary | ICD-10-CM

## 2013-12-17 DIAGNOSIS — G253 Myoclonus: Secondary | ICD-10-CM

## 2013-12-17 MED ORDER — CLONAZEPAM 0.5 MG PO TABS: 0.5000 mg | ORAL_TABLET | Freq: Every day | ORAL | Status: DC

## 2013-12-17 NOTE — Progress Notes (Signed)
PATIENT: Cory Johnson DOB: 1955-04-02  HISTORICAL  Cory Johnson is a 58 years old African American male, return to followup with his wife, last clinical visit was April 2013  He had a history of cervical fusion, due to advanced degenerative disc disease, presenting with progressive anterocollis, severe limited range of motion of his neck muscles  I saw him previously for  frequent nocturnal jerking movements.  He described that he had frequent transient jerking movements of his body while he was falling into sleep, he was able to quickly drifting back asleep again.   He also also has excessive movement  during the night sleep, kicking, moving his limbs, occasionally talking, cause a lot of complains from his wife.  he often dreamed of playing basketball.   He denied excessive snoring, or stop breathing during the the snoring.  He also complain excessive daytime fatigue, especially during the evening time, he would easily doze off sitting at the  meetings or watching TV.    He had sleep study in October 2013, which indeed demonstrate REM sleep disorder. There is no sleep apnea.  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, he had college degree,  Wife reported that he has increased the difficulty over the past couple years, he tends to repeat himself, become irritable, frustrated easily,  He denies gait difficulty, no bowel bladder incontinence, chronic neck pain,   REVIEW OF SYSTEMS: Full 14 system review of systems performed and notable only for   Unexpected weight change, drooling, cold intolerance, constipation, back pain, walking difficulty, neck pain, neck  stiffness, memory loss, tremor, facial drooling, behavior issues, confusion  ALLERGIES: No Known Allergies  HOME MEDICATIONS: Current Outpatient Prescriptions on File Prior to Visit  Medication Sig Dispense Refill  . HYDROcodone-acetaminophen (NORCO) 10-325 MG per tablet        No current facility-administered medications on file prior to visit.    PAST MEDICAL HISTORY: Past Medical History  Diagnosis Date  . Right inguinal hernia 02/2011  . Arthritis     neck - limited range of motion; back, shoulders  . Umbilical hernia 16/0109  . Chronic back pain   . Difficult intubation   . Acute pulmonary embolus 11/07/2009  . DVT of lower extremity (deep venous thrombosis) 11/07/2009  . Seizures     PAST SURGICAL HISTORY: Past Surgical History  Procedure Laterality Date  . Knee surgery  1977    left  . Inguinal hernia repair  03/17/2011    Procedure: HERNIA REPAIR INGUINAL ADULT;  Surgeon: Adin Hector, MD;  Location: [facility];  Service: General;  Laterality: Right;  right inguinal hernia repair with mesh  . Umbilical hernia repair  03/17/2011    Procedure: HERNIA REPAIR UMBILICAL ADULT;  Surgeon: Adin Hector, MD;  Location: [facility];  Service: General;  Laterality: N/A;  umbilical hernia repair with mesh  . Hernia repair      FAMILY HISTORY: History reviewed. No pertinent family history.  SOCIAL HISTORY:  History   Social History  . Marital Status: Married    Spouse Name: [name]    Number of Children: 5  . Years of  Education: college   Occupational History  . DIRECTOR    Social History Main Topics  . Smoking status: Never Smoker   . Smokeless tobacco: Never Used  . Alcohol Use: No  . Drug Use: No  . Sexual Activity: Yes    Birth Control/ Protection: Surgical   Other Topics Concern  . Not on file   Social History Narrative   Patient lives at home with his wife [name]).   Disabled.   Education college.   Right handed.    Caffeine one cup of coffee daily.     PHYSICAL EXAM   Filed Vitals:   12/17/13 1112  BP: 117/77  Pulse: 68  Height: 6' 1"  (1.854 m)  Weight: 234 lb (106.142 kg)    Body mass index is 30.88 kg/(m^2).  Physical Exam  Neck: supple no carotid bruits Respiratory: clear to auscultation bilaterally Cardiovascular: regular rate rhythm  Neurologic Exam  Mental Status: pleasant, awake, alert, cooperative to history, talking, and casual conversation. Profound anterocolis, limited range of motion of his neck muscles, Mini-Mental Status Examination is 30 out of 30 Cranial Nerves: CN II-XII pupils were equal round reactive to light.  Extraocular movements were full.  Visual fields were full on confrontational test.  Facial sensation and strength were normal.  Hearing was intact to finger rubbing bilaterally.  Uvula tongue were midline.   shoulder shrugging were normal and symmetric. His neck stay in fixed anterocollis, with limited range of motion. Tongue protrusion into the cheeks strength were normal.  Motor: Normal tone, bulk, and strength. Sensory: Normal to light touch Coordination: Normal finger-to-nose, heel-to-shin.  There was no dysmetria noticed. Gait and Station: profound antercollis, moderate stride, smooth turning. Reflexes: Deep tendon reflexes: Biceps: 2/2, Brachioradialis: 2/2, Triceps: 2/2, Pateller: 2/2, Achilles: 2/2.  Plantar responses are flexor   DIAGNOSTIC DATA (LABS, IMAGING, TESTING) - I reviewed patient records, labs, notes, testing and imaging myself where available.  Lab Results  Component Value Date   WBC 8.0 12/04/2013   HGB 15.2 12/04/2013   HCT 48.8 12/04/2013   MCV 87.4 12/04/2013   PLT 223 07/27/2010      Component Value Date/Time   NA 138 12/04/2013 1454   K 4.3 12/04/2013 1454   CL 101 12/04/2013 1454   CO2 30 12/04/2013 1454   GLUCOSE 82 12/04/2013 1454   BUN 10 12/04/2013 1454   CREATININE 0.82 12/04/2013 1454   CREATININE 0.92 07/27/2010 1422    CALCIUM 9.6 12/04/2013 1454   PROT 8.1 12/04/2013 1454   ALBUMIN 4.1 12/04/2013 1454   AST 13 12/04/2013 1454   ALT 11 12/04/2013 1454   ALKPHOS 84 12/04/2013 1454   BILITOT 0.7 12/04/2013 1454   GFRNONAA >60 11/07/2009 1405   GFRAA  Value: >60        The eGFR has been calculated using the MDRD equation. This calculation has not been validated in all clinical situations. eGFR's persistently <60 mL/min signify possible Chronic Kidney Disease. 11/07/2009 1405   Lab Results  Component Value Date   CHOL 183 12/04/2013   HDL 55 12/04/2013   LDLCALC 115* 12/04/2013   TRIG 65 12/04/2013   CHOLHDL 3.3 12/04/2013   Lab Results  Component Value Date   HGBA1C   11/08/2009   No results found for this basename: [id number]   Lab Results  Component Value Date   TSH 1.303 07/20/2011    ASSESSMENT AND PLAN  DEAVEN URWIN is a 58 y.o. male with past medical history of cervical  fusion due to advanced cervical degenerative disc disease, severe limited range of motion of the cervical muscles, also had past medical history of REM sleep disorder, now with mild memory trouble,  1. Differentiation diagnosis including mild cognitive impairment, vs. early central nervous system degenerative disorder, with his documented history of REM sleep disorder 2. Proceed with MRI of brain 3. Return to clinic in one to 2 to 3 weeks    Marcial Pacas, M.D. Ph.D.  [facility] [address], [place], [place] [address] [phone]

## 2013-12-24 ENCOUNTER — Telehealth: Payer: Self-pay

## 2013-12-24 ENCOUNTER — Ambulatory Visit
Admission: RE | Admit: 2013-12-24 | Discharge: 2013-12-24 | Disposition: A | Discharge status: Home / Self Care | Payer: 59 | Source: Ambulatory Visit | Attending: Neurology | Admitting: Neurology

## 2013-12-24 DIAGNOSIS — I2699 Other pulmonary embolism without acute cor pulmonale: Secondary | ICD-10-CM

## 2013-12-24 DIAGNOSIS — I82401 Acute embolism and thrombosis of unspecified deep veins of right lower extremity: Secondary | ICD-10-CM

## 2013-12-24 DIAGNOSIS — G253 Myoclonus: Secondary | ICD-10-CM

## 2013-12-24 NOTE — Telephone Encounter (Signed)
Patient was in the office for a physical and forgot to ask if he get a prescription for Viagra.  Patient would like for someone to call him back to see if we can write this for him or if he will need to come back in for that prescription.   Best#: [phone]

## 2013-12-24 NOTE — Telephone Encounter (Signed)
Please advise 

## 2013-12-26 ENCOUNTER — Other Ambulatory Visit: Payer: Self-pay | Admitting: Emergency Medicine

## 2013-12-26 ENCOUNTER — Telehealth: Payer: Self-pay | Admitting: Neurology

## 2013-12-26 MED ORDER — SILDENAFIL CITRATE 100 MG PO TABS: 50.0000 mg | ORAL_TABLET | Freq: Every day | ORAL | Status: DC | PRN

## 2013-12-26 NOTE — Telephone Encounter (Signed)
Pt.notified

## 2013-12-26 NOTE — Telephone Encounter (Signed)
Prescription sent

## 2013-12-26 NOTE — Telephone Encounter (Signed)
I have called patient for normal CAT scan of the brain

## 2013-12-31 ENCOUNTER — Ambulatory Visit (INDEPENDENT_AMBULATORY_CARE_PROVIDER_SITE_OTHER): Payer: 59 | Admitting: Neurology

## 2013-12-31 ENCOUNTER — Encounter: Payer: Self-pay | Admitting: Neurology

## 2013-12-31 VITALS — BP 128/80 | HR 68 | Ht 73.0 in | Wt 232.0 lb

## 2013-12-31 DIAGNOSIS — R413 Other amnesia: Secondary | ICD-10-CM | POA: Insufficient documentation

## 2013-12-31 NOTE — Progress Notes (Signed)
PATIENT: Cory Johnson DOB: 1955-12-14  HISTORICAL  Cory Johnson is a 58 years old African American male, return to followup with his wife, last clinical visit was April 2013  He had a history of cervical fusion, due to advanced degenerative disc disease, presenting with progressive anterocollis, severe limited range of motion of his neck muscles  I saw him previously for  frequent nocturnal jerking movements.  He described that he had frequent transient jerking movements of his body while he was falling into sleep, he was able to quickly drifting back asleep again.   He also also has excessive movement  during the night sleep, kicking, moving his limbs, occasionally talking, cause a lot of complains from his wife.  he often dreamed of playing basketball.   He denied excessive snoring, or stop breathing during the the snoring.  He also complain excessive daytime fatigue, especially during the evening time, he would easily doze off sitting at the  meetings or watching TV.    He had sleep study in October 2013, which indeed demonstrate REM sleep disorder. There is no sleep apnea.  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 CT scan of the brain was normal.  He continued to complains of neck pain, REM sleep disorder, frequent awakening during nighttime because of  the neck pain, he is exercising regularly  REVIEW OF SYSTEMS: Full 14 system review of systems performed and notable only for  Neck pain  ALLERGIES: No Known Allergies  HOME MEDICATIONS: Current Outpatient Prescriptions on File Prior to Visit  Medication Sig Dispense Refill  . clonazePAM (KLONOPIN) 0.5 MG tablet Take 1 tablet (0.5 mg total) by mouth at bedtime. 1/2 to one tab po qhs  30 tablet  5  . HYDROcodone-acetaminophen (NORCO) 10-325 MG per tablet       . sildenafil (VIAGRA) 100 MG tablet Take 0.5-1 tablets (50-100 mg total) by mouth daily as needed for erectile dysfunction.  5 tablet  11   No current facility-administered medications on file prior to visit.    PAST MEDICAL HISTORY: Past Medical History  Diagnosis Date  . Right inguinal hernia 02/2011  . Arthritis     neck - limited range of motion; back, shoulders  . Umbilical hernia 04/4095  . Chronic back pain   . Difficult intubation   . Acute pulmonary embolus 11/07/2009  . DVT of lower extremity (deep venous thrombosis) 11/07/2009  . Seizures     PAST SURGICAL HISTORY: Past Surgical History  Procedure Laterality Date  . Knee surgery  1977    left  . Inguinal hernia repair  03/17/2011    Procedure: HERNIA REPAIR INGUINAL ADULT;  Surgeon: Adin Hector, MD;  Location: [facility];  Service: General;  Laterality: Right;  right inguinal hernia repair with mesh  . Umbilical hernia repair  03/17/2011    Procedure:  HERNIA REPAIR UMBILICAL ADULT;  Surgeon: Adin Hector, MD;  Location: [facility];  Service: General;  Laterality: N/A;  umbilical hernia repair with mesh  . Hernia repair      FAMILY HISTORY: History reviewed. No pertinent family history.  SOCIAL HISTORY:  History   Social History  . Marital Status: Married    Spouse Name: [name]    Number of Children: 5  . Years of Education: college   Occupational History  . DIRECTOR    Social History Main Topics  . Smoking  status: Never Smoker   . Smokeless tobacco: Never Used  . Alcohol Use: No  . Drug Use: No  . Sexual Activity: Yes    Birth Control/ Protection: Surgical   Other Topics Concern  . Not on file   Social History Narrative   Patient lives at home with his wife [name]).   Disabled.   Education college.   Right handed.   Caffeine one cup of coffee daily.     PHYSICAL EXAM   Filed Vitals:   12/31/13 1041  BP: 128/80  Pulse: 68  Height: 6' 1"  (1.854 m)  Weight: 232 lb (105.235 kg)    Body mass index is 30.62 kg/(m^2).  Physical Exam  Neck: supple no carotid bruits Respiratory: clear to auscultation bilaterally Cardiovascular: regular rate rhythm  Neurologic Exam  Mental Status: pleasant, awake, alert, cooperative to history, talking, and casual conversation. Profound anterocolis, limited range of motion of his neck muscles, Mini-Mental Status Examination is 30 out of 30 Cranial Nerves: CN II-XII pupils were equal round reactive to light.  Extraocular movements were full.  Visual fields were full on confrontational test.  Facial sensation and strength were normal.  Hearing was intact to finger rubbing bilaterally.  Uvula tongue were midline.   shoulder shrugging were normal and symmetric. His neck stay in fixed anterocollis, with limited range of motion. Tongue protrusion into the cheeks strength were normal.  Motor: Normal tone, bulk, and strength. Sensory: Normal to light touch Coordination: Normal finger-to-nose, heel-to-shin.  There was no dysmetria noticed. Gait and Station: profound antercollis, moderate stride, smooth turning. Reflexes: Deep tendon reflexes: Biceps: 2/2, Brachioradialis: 2/2, Triceps: 2/2, Pateller: 2/2, Achilles: 2/2.  Plantar responses are flexor   DIAGNOSTIC DATA (LABS, IMAGING, TESTING) - I reviewed patient records, labs, notes, testing and imaging myself where available.  Lab Results  Component Value Date   WBC 8.0 12/04/2013   HGB 15.2  12/04/2013   HCT 48.8 12/04/2013   MCV 87.4 12/04/2013   PLT 223 07/27/2010      Component Value Date/Time   NA 138 12/04/2013 1454   K 4.3 12/04/2013 1454   CL 101 12/04/2013 1454   CO2 30 12/04/2013 1454   GLUCOSE 82 12/04/2013 1454   BUN 10 12/04/2013 1454   CREATININE 0.82 12/04/2013 1454   CREATININE 0.92 07/27/2010 1422   CALCIUM 9.6 12/04/2013 1454   PROT 8.1 12/04/2013 1454   ALBUMIN 4.1 12/04/2013 1454   AST 13 12/04/2013 1454   ALT 11 12/04/2013 1454   ALKPHOS 84 12/04/2013 1454   BILITOT 0.7 12/04/2013 1454   GFRNONAA >60 11/07/2009 1405   GFRAA  Value: >60        The eGFR has been calculated using the MDRD equation. This calculation has not been validated in all clinical situations. eGFR's persistently <60 mL/min signify possible Chronic Kidney Disease. 11/07/2009 1405   Lab Results  Component Value Date   CHOL 183 12/04/2013  HDL 55 12/04/2013   LDLCALC 115* 12/04/2013   TRIG 65 12/04/2013   CHOLHDL 3.3 12/04/2013   Lab Results  Component Value Date   HGBA1C   11/08/2009   No results found for this basename: [id number]   Lab Results  Component Value Date   TSH 1.303 07/20/2011    ASSESSMENT AND PLAN  Cory Johnson is a 58 y.o. male with past medical history of cervical fusion due to advanced cervical degenerative disc disease, severe limited range of motion of the cervical muscles, also had past medical history of REM sleep disorder, now with mild memory trouble,  1. Differentiation diagnosis including mild cognitive impairment, vs. early central nervous system degenerative disorder, with his documented history of REM sleep disorder, normal CT scan of the brain, Mini-Mental Status Examination is 30 out of 30 now, 2. TSH, B12, I will call him report, 3. I have encouraged him to continue moderate exercise, return to clinic with nurse practitioner in 6 months    Marcial Pacas, M.D. Ph.D.  [facility] [address] [place], [place] [address] [phone]

## 2014-01-01 LAB — THYROID PANEL WITH TSH
Free Thyroxine Index: 2.5 (ref 1.2–4.9)
T3 Uptake Ratio: 25 % (ref 24–39)
T4, Total: 10 ug/dL (ref 4.5–12.0)
TSH: 1.02 u[IU]/mL (ref 0.450–4.500)

## 2014-01-01 LAB — VITAMIN B12: Vitamin B-12: 452 pg/mL (ref 211–946)

## 2014-01-05 NOTE — Progress Notes (Signed)
Quick Note:  Called and spoke to patient told him normal labs. Patient understood. ______

## 2014-06-10 ENCOUNTER — Ambulatory Visit (INDEPENDENT_AMBULATORY_CARE_PROVIDER_SITE_OTHER): Payer: 59 | Admitting: Family Medicine

## 2014-06-10 VITALS — BP 124/84 | HR 60 | Temp 98.1°F | Resp 18 | Ht 73.0 in | Wt 224.2 lb

## 2014-06-10 DIAGNOSIS — G629 Polyneuropathy, unspecified: Secondary | ICD-10-CM

## 2014-06-10 DIAGNOSIS — R208 Other disturbances of skin sensation: Secondary | ICD-10-CM | POA: Diagnosis not present

## 2014-06-10 DIAGNOSIS — R200 Anesthesia of skin: Secondary | ICD-10-CM

## 2014-06-10 LAB — COMPREHENSIVE METABOLIC PANEL
ALT: 10 U/L (ref 0–53)
AST: 14 U/L (ref 0–37)
Albumin: 4 g/dL (ref 3.5–5.2)
Alkaline Phosphatase: 89 U/L (ref 39–117)
BUN: 10 mg/dL (ref 6–23)
CO2: 30 mEq/L (ref 19–32)
Calcium: 9 mg/dL (ref 8.4–10.5)
Chloride: 101 mEq/L (ref 96–112)
Creat: 0.74 mg/dL (ref 0.50–1.35)
Glucose, Bld: 82 mg/dL (ref 70–99)
Potassium: 4.3 mEq/L (ref 3.5–5.3)
Sodium: 137 mEq/L (ref 135–145)
Total Bilirubin: 0.6 mg/dL (ref 0.2–1.2)
Total Protein: 7.8 g/dL (ref 6.0–8.3)

## 2014-06-10 LAB — TSH: TSH: 0.751 u[IU]/mL (ref 0.350–4.500)

## 2014-06-10 LAB — VITAMIN B12: Vitamin B-12: 456 pg/mL (ref 211–911)

## 2014-06-10 NOTE — Patient Instructions (Signed)
You should receive a call or letter about your lab results within the next week to 10 days.  Call Dr. [name] for follow up appointment as I would like her to evaluate you for the symptoms in your feet to decide further testing if necessary after the blood tests I have checked are back.  Return to the clinic or go to the nearest emergency room if any of your symptoms worsen or new symptoms occur.  Peripheral Neuropathy Peripheral neuropathy is a type of nerve damage. It affects nerves that carry signals between the spinal cord and other parts of the body. These are called peripheral nerves. With peripheral neuropathy, one nerve or a group of nerves may be damaged.  CAUSES  Many things can damage peripheral nerves. For some people with peripheral neuropathy, the cause is unknown. Some causes include:  Diabetes. This is the most common cause of peripheral neuropathy.  Injury to a nerve.  Pressure or stress on a nerve that lasts a long time.  Too little vitamin B. Alcoholism can lead to this.  Infections.  Autoimmune diseases, such as multiple sclerosis and systemic lupus erythematosus.  Inherited nerve diseases.  Some medicines, such as cancer drugs.  Toxic substances, such as lead and mercury.  Too little blood flowing to the legs.  Kidney disease.  Thyroid disease. SIGNS AND SYMPTOMS  Different people have different symptoms. The symptoms you have will depend on which of your nerves is damaged. Common symptoms include:  Loss of feeling (numbness) in the feet and hands.  Tingling in the feet and hands.  Pain that burns.  Very sensitive skin.  Weakness.  Not being able to move a part of the body (paralysis).  Muscle twitching.  Clumsiness or poor coordination.  Loss of balance.  Not being able to control your bladder.  Feeling dizzy.  Sexual problems. DIAGNOSIS  Peripheral neuropathy is a symptom, not a disease. Finding the cause of peripheral neuropathy can be  hard. To figure that out, your health care provider will take a medical history and do a physical exam. A neurological exam will also be done. This involves checking things affected by your brain, spinal cord, and nerves (nervous system). For example, your health care provider will check your reflexes, how you move, and what you can feel.  Other types of tests may also be ordered, such as:  Blood tests.  A test of the fluid in your spinal cord.  Imaging tests, such as CT scans or an MRI.  Electromyography (EMG). This test checks the nerves that control muscles.  Nerve conduction velocity tests. These tests check how fast messages pass through your nerves.  Nerve biopsy. A small piece of nerve is removed. It is then checked under a microscope. TREATMENT   Medicine is often used to treat peripheral neuropathy. Medicines may include:  Pain-relieving medicines. Prescription or over-the-counter medicine may be suggested.  Antiseizure medicine. This may be used for pain.  Antidepressants. These also may help ease pain from neuropathy.  Lidocaine. This is a numbing medicine. You might wear a patch or be given a shot.  Mexiletine. This medicine is typically used to help control irregular heart rhythms.  Surgery. Surgery may be needed to relieve pressure on a nerve or to destroy a nerve that is causing pain.  Physical therapy to help movement.  Assistive devices to help movement. HOME CARE INSTRUCTIONS   Only take over-the-counter or prescription medicines as directed by your health care provider. Follow the instructions carefully  for any given medicines. Do not take any other medicines without first getting approval from your health care provider.  If you have diabetes, work closely with your health care provider to keep your blood sugar under control.  If you have numbness in your feet:  Check every day for signs of injury or infection. Watch for redness, warmth, and  swelling.  Wear padded socks and comfortable shoes. These help protect your feet.  Do not do things that put pressure on your damaged nerve.  Do not smoke. Smoking keeps blood from getting to damaged nerves.  Avoid or limit alcohol. Too much alcohol can cause a lack of B vitamins. These vitamins are needed for healthy nerves.  Develop a good support system. Coping with peripheral neuropathy can be stressful. Talk to a mental health specialist or join a support group if you are struggling.  Follow up with your health care provider as directed. SEEK MEDICAL CARE IF:   You have new signs or symptoms of peripheral neuropathy.  You are struggling emotionally from dealing with peripheral neuropathy.  You have a fever. SEEK IMMEDIATE MEDICAL CARE IF:   You have an injury or infection that is not healing.  You feel very dizzy or begin vomiting.  You have chest pain.  You have trouble breathing. Document Released: 02/17/2002 Document Revised: 11/09/2010 Document Reviewed: 11/04/2012 [facility] Patient Information 2015 [place], [place]. This information is not intended to replace advice given to you by your health care provider. Make sure you discuss any questions you have with your health care provider.

## 2014-06-10 NOTE — Progress Notes (Signed)
Subjective:  This chart was scribed for Cory Staggers, MD by Elveria Rising, Medial Scribe. This patient was seen in room 1 and the patient's care was started at 1:49 PM.    Patient ID: Cory Johnson, male    DOB: 04/08/1955, 59 y.o.   MRN: [id number] Chief Complaint  Patient presents with  . Numbness    in toes on both feet x 5 months    HPI HPI Comments: CHARELS Johnson is a 59 y.o. male who presents to the [facility] complaining of worsesning numbing sensation in bilateral feet, ongoing for 3-4 months now. Patient is followed by Dr. Terrace Arabia with [facility]. Patient has history of advanced DDD of the cervical spine. Patient seen for frequent night time jerking movements; his most recent visit was 12/31/2013 with Dr. Terrace Arabia. Patient also evaluated for mild memory distrurabce at that time. Patient has MMSE of 30/30. CT of head was negative, B12 and TSH were also normal in 12/2013. Patient's most recent CMP in 09/215 was normal with glucose 82.  Today, patient locates numbing pain in plantar aspect of all toes and balls of his feet bilaterally. Patient reports increased numbness in his feet especially with lying down at night. Patient reports occasional numbness in his fingers, but states that this is not as worrisome as in his feet. Patient shares history of PE; at the time he reports experiencing leg pain. Patient denies calf pain, shortness or breath, leg pain, back pain, saddle anesthesia, bladder/bowel incontinence or urinary symptoms currently. Patient shares that he does not have an appointment with Dr. Terrace Arabia, but agrees to set up an appointment for further evaluation of his numbness and tingling.   Patient Active Problem List   Diagnosis Date Noted  . Memory loss 12/31/2013  . Back pain 08/18/2013  . Arthritis   . Myoclonic jerking   . Right inguinal hernia 03/17/2011  . Umbilical hernia 03/17/2011  . Acute pulmonary embolus 11/07/2009  . DVT of lower extremity  (deep venous thrombosis) 11/07/2009   Past Medical History  Diagnosis Date  . Right inguinal hernia 02/2011  . Arthritis     neck - limited range of motion; back, shoulders  . Umbilical hernia 02/2011  . Chronic back pain   . Difficult intubation   . Acute pulmonary embolus 11/07/2009  . DVT of lower extremity (deep venous thrombosis) 11/07/2009  . Seizures    Past Surgical History  Procedure Laterality Date  . Knee surgery  1977    left  . Inguinal hernia repair  03/17/2011    Procedure: HERNIA REPAIR INGUINAL ADULT;  Surgeon: Ernestene Mention, MD;  Location: [facility];  Service: General;  Laterality: Right;  right inguinal hernia repair with mesh  . Umbilical hernia repair  03/17/2011    Procedure: HERNIA REPAIR UMBILICAL ADULT;  Surgeon: Ernestene Mention, MD;  Location: [facility];  Service: General;  Laterality: N/A;  umbilical hernia repair with mesh  . Hernia repair     No Known Allergies Prior to Admission medications   Medication Sig Start Date End Date Taking? Authorizing Provider  HYDROcodone-acetaminophen [facility]) 10-325 MG per tablet  03/09/11  Yes Historical Provider, MD  clonazePAM (KLONOPIN) 0.5 MG tablet Take 1 tablet (0.5 mg total) by mouth at bedtime. 1/2 to one tab po qhs Patient not taking: Reported on 06/10/2014 12/17/13   Levert Feinstein, MD  sildenafil (VIAGRA) 100 MG tablet Take 0.5-1 tablets (50-100 mg total) by mouth  daily as needed for erectile dysfunction. Patient not taking: Reported on 06/10/2014 12/26/13   Carmelina Dane, MD   History   Social History  . Marital Status: Married    Spouse Name: [name]  . Number of Children: 5  . Years of Education: college   Occupational History  . DIRECTOR    Social History Main Topics  . Smoking status: Never Smoker   . Smokeless tobacco: Never Used  . Alcohol Use: No  . Drug Use: No  . Sexual Activity: Yes    Birth Control/ Protection: Surgical   Other Topics Concern  . Not on  file   Social History Narrative   Patient lives at home with his wife [name].   Disabled.   Education college.   Right handed.   Caffeine one cup of coffee daily.    Review of Systems  Constitutional: Negative for fever and chills.  Respiratory: Negative for chest tightness.   Cardiovascular: Negative for chest pain and leg swelling.  Musculoskeletal: Negative for back pain.  Neurological: Positive for numbness. Negative for weakness.       Objective:   Physical Exam  Constitutional: He is oriented to person, place, and time. He appears well-developed and well-nourished. No distress.  HENT:  Head: Normocephalic and atraumatic.  Eyes: EOM are normal.  Neck: Neck supple. No tracheal deviation present.  Cardiovascular: Normal rate, regular rhythm and normal heart sounds.   Pulmonary/Chest: Effort normal and breath sounds normal. No respiratory distress. He has no wheezes.  Musculoskeletal:  Calves nontender. No lower extremity edema.  Neurological: He is alert and oriented to person, place, and time.  Reflexes at patella and achilles are 1+ and equal bilaterally. Babisnki's testing is negative with toes down going.   Skin: Skin is warm and dry.  Bilateral Feet: skin intact. Slightly dry calices under plantar surface of feet. No wounds. Cap refill <1 sec at all toes. DP is 2+ bilaterally.   Psychiatric: He has a normal mood and affect. His behavior is normal.  Nursing note and vitals reviewed.    Filed Vitals:   06/10/14 1310  BP: 124/84  Pulse: 60  Temp: 98.1 F (36.7 C)  TempSrc: Oral  Resp: 18  Height:  (1.854 m)  Weight: 224 lb 3.2 oz (101.696 kg)  SpO2: 97%        Assessment & Plan:  [name] is a 59 y.o. male Peripheral neuropathy - Plan: Comprehensive metabolic panel, TSH, Vitamin B12  Numbness of toes - Plan: Comprehensive metabolic panel, TSH, Vitamin B12 ddx of lumbar spinal stenosis, or metabolic, vs neurologic source. Recent CT  without acute findings. Declined lumbar imaging at present. Repeat TSH, B12 level and follow up with neuro to decide next step in testing. rtc sooner if worse.    No orders of the defined types were placed in this encounter.   Patient Instructions  You should receive a call or letter about your lab results within the next week to 10 days.  Call Dr. Terrace Arabia for follow up appointment as I would like her to evaluate you for the symptoms in your feet to decide further testing if necessary after the blood tests I have checked are back.  Return to the clinic or go to the nearest emergency room if any of your symptoms worsen or new symptoms occur.  Peripheral Neuropathy Peripheral neuropathy is a type of nerve damage. It affects nerves that carry signals between the spinal cord and other parts of  the body. These are called peripheral nerves. With peripheral neuropathy, one nerve or a group of nerves may be damaged.  CAUSES  Many things can damage peripheral nerves. For some people with peripheral neuropathy, the cause is unknown. Some causes include:  Diabetes. This is the most common cause of peripheral neuropathy.  Injury to a nerve.  Pressure or stress on a nerve that lasts a long time.  Too little vitamin B. Alcoholism can lead to this.  Infections.  Autoimmune diseases, such as multiple sclerosis and systemic lupus erythematosus.  Inherited nerve diseases.  Some medicines, such as cancer drugs.  Toxic substances, such as lead and mercury.  Too little blood flowing to the legs.  Kidney disease.  Thyroid disease. SIGNS AND SYMPTOMS  Different people have different symptoms. The symptoms you have will depend on which of your nerves is damaged. Common symptoms include:  Loss of feeling (numbness) in the feet and hands.  Tingling in the feet and hands.  Pain that burns.  Very sensitive skin.  Weakness.  Not being able to move a part of the body (paralysis).  Muscle  twitching.  Clumsiness or poor coordination.  Loss of balance.  Not being able to control your bladder.  Feeling dizzy.  Sexual problems. DIAGNOSIS  Peripheral neuropathy is a symptom, not a disease. Finding the cause of peripheral neuropathy can be hard. To figure that out, your health care provider will take a medical history and do a physical exam. A neurological exam will also be done. This involves checking things affected by your brain, spinal cord, and nerves (nervous system). For example, your health care provider will check your reflexes, how you move, and what you can feel.  Other types of tests may also be ordered, such as:  Blood tests.  A test of the fluid in your spinal cord.  Imaging tests, such as CT scans or an MRI.  Electromyography (EMG). This test checks the nerves that control muscles.  Nerve conduction velocity tests. These tests check how fast messages pass through your nerves.  Nerve biopsy. A small piece of nerve is removed. It is then checked under a microscope. TREATMENT   Medicine is often used to treat peripheral neuropathy. Medicines may include:  Pain-relieving medicines. Prescription or over-the-counter medicine may be suggested.  Antiseizure medicine. This may be used for pain.  Antidepressants. These also may help ease pain from neuropathy.  Lidocaine. This is a numbing medicine. You might wear a patch or be given a shot.  Mexiletine. This medicine is typically used to help control irregular heart rhythms.  Surgery. Surgery may be needed to relieve pressure on a nerve or to destroy a nerve that is causing pain.  Physical therapy to help movement.  Assistive devices to help movement. HOME CARE INSTRUCTIONS   Only take over-the-counter or prescription medicines as directed by your health care provider. Follow the instructions carefully for any given medicines. Do not take any other medicines without first getting approval from your health  care provider.  If you have diabetes, work closely with your health care provider to keep your blood sugar under control.  If you have numbness in your feet:  Check every day for signs of injury or infection. Watch for redness, warmth, and swelling.  Wear padded socks and comfortable shoes. These help protect your feet.  Do not do things that put pressure on your damaged nerve.  Do not smoke. Smoking keeps blood from getting to damaged nerves.  Avoid or  limit alcohol. Too much alcohol can cause a lack of B vitamins. These vitamins are needed for healthy nerves.  Develop a good support system. Coping with peripheral neuropathy can be stressful. Talk to a mental health specialist or join a support group if you are struggling.  Follow up with your health care provider as directed. SEEK MEDICAL CARE IF:   You have new signs or symptoms of peripheral neuropathy.  You are struggling emotionally from dealing with peripheral neuropathy.  You have a fever. SEEK IMMEDIATE MEDICAL CARE IF:   You have an injury or infection that is not healing.  You feel very dizzy or begin vomiting.  You have chest pain.  You have trouble breathing. Document Released: 02/17/2002 Document Revised: 11/09/2010 Document Reviewed: 11/04/2012 [facility] Patient Information 2015 [place], [place]. This information is not intended to replace advice given to you by your health care provider. Make sure you discuss any questions you have with your health care provider.     I personally performed the services described in this documentation, which was scribed in my presence. The recorded information has been reviewed and considered, and addended by me as needed.

## 2014-07-02 ENCOUNTER — Ambulatory Visit: Payer: 59 | Admitting: Nurse Practitioner

## 2015-04-02 ENCOUNTER — Encounter: Payer: Self-pay | Admitting: Family Medicine

## 2015-04-07 ENCOUNTER — Encounter: Payer: Self-pay | Admitting: Family Medicine

## 2016-01-10 ENCOUNTER — Ambulatory Visit (INDEPENDENT_AMBULATORY_CARE_PROVIDER_SITE_OTHER): Payer: 59 | Admitting: Family Medicine

## 2016-01-10 VITALS — BP 120/70 | HR 67 | Temp 98.5°F | Resp 18 | Ht 73.0 in | Wt 213.0 lb

## 2016-01-10 DIAGNOSIS — G609 Hereditary and idiopathic neuropathy, unspecified: Secondary | ICD-10-CM | POA: Diagnosis not present

## 2016-01-10 DIAGNOSIS — R399 Unspecified symptoms and signs involving the genitourinary system: Secondary | ICD-10-CM

## 2016-01-10 DIAGNOSIS — R350 Frequency of micturition: Secondary | ICD-10-CM | POA: Diagnosis not present

## 2016-01-10 LAB — POCT URINALYSIS DIP (MANUAL ENTRY)
Bilirubin, UA: NEGATIVE
Glucose, UA: NEGATIVE
Ketones, POC UA: NEGATIVE
Leukocytes, UA: NEGATIVE
Nitrite, UA: NEGATIVE
Protein Ur, POC: NEGATIVE
Spec Grav, UA: 1.015
Urobilinogen, UA: 0.2
pH, UA: 6.5

## 2016-01-10 LAB — POC MICROSCOPIC URINALYSIS (UMFC): Mucus: ABSENT

## 2016-01-10 LAB — VITAMIN B12: Vitamin B-12: 453 pg/mL (ref 200–1100)

## 2016-01-10 LAB — PSA: PSA: 1 ng/mL (ref <=4.0)

## 2016-01-10 MED ORDER — GABAPENTIN 100 MG PO CAPS: 100.0000 mg | ORAL_CAPSULE | Freq: Three times a day (TID) | ORAL | 0 refills | Status: DC

## 2016-01-10 NOTE — Progress Notes (Signed)
Chief Complaint  Patient presents with  . Urinary Frequency    first noticed a few weeks ago  . Numbness    in toes, has been going on for a couple of months. No known injuries to feet or toes.     HPI   Pt reports that he has been having urgency and frequency No dysuria No hematuria + flank pain on both sides Reports that he had a kidney stone about 10 years  Numbness in Feet Reports numbness in the great toes bilaterally and the ball of each feet He feels the numbness on the bottom of the feet Reports that the symptoms started several months ago He describes the numbness "feels like no circulation" and there is some pain associated with it  PSA screening He denies family history of prostate cancer He has not had his psa checked for 2 years His family member asked to have his levels checked He denies urinary retention   Past Medical History:  Diagnosis Date  . Acute pulmonary embolus (HCC) 11/07/2009  . Arthritis    neck - limited range of motion; back, shoulders  . Chronic back pain   . Difficult intubation   . DVT of lower extremity (deep venous thrombosis) (HCC) 11/07/2009  . Right inguinal hernia 02/2011  . Seizures (HCC)   . Umbilical hernia 02/2011    Current Outpatient Prescriptions  Medication Sig Dispense Refill  . HYDROcodone-acetaminophen (NORCO) 10-325 MG per tablet     . gabapentin (NEURONTIN) 100 MG capsule Take 1 capsule (100 mg total) by mouth 3 (three) times daily. Take 1 capsule by mouth at bedtime week 1 then increase weekly until taking 3 capsules by mouth at bedtime (week 3) 90 capsule 0   No current facility-administered medications for this visit.     Allergies: No Known Allergies  Past Surgical History:  Procedure Laterality Date  . HERNIA REPAIR    . INGUINAL HERNIA REPAIR  03/17/2011   Procedure: HERNIA REPAIR INGUINAL ADULT;  Surgeon: Ernestene MentionHaywood M Ingram, MD;  Location: [facility];  Service: General;  Laterality: Right;   right inguinal hernia repair with mesh  . KNEE SURGERY  1977   left  . UMBILICAL HERNIA REPAIR  03/17/2011   Procedure: HERNIA REPAIR UMBILICAL ADULT;  Surgeon: Ernestene MentionHaywood M Ingram, MD;  Location: [facility];  Service: General;  Laterality: N/A;  umbilical hernia repair with mesh    Social History   Social History  . Marital status: Married    Spouse name: Cory Johnson  . Number of children: 5  . Years of education: college   Occupational History  . DIRECTOR [employer]   Social History Main Topics  . Smoking status: Never Smoker  . Smokeless tobacco: Never Used  . Alcohol use No  . Drug use: No  . Sexual activity: Yes    Birth control/ protection: Surgical   Other Topics Concern  . None   Social History Narrative   Patient lives at home with his wife Cory Spurr([name]).   Disabled.   Education college.   Right handed.   Caffeine one cup of coffee daily.    ROS  Objective: Vitals:   01/10/16 1146  BP: 120/70  Pulse: 67  Resp: 18  Temp: 98.5 F (36.9 C)  TempSrc: Oral  SpO2: 98%  Weight: 213 lb (96.6 kg)  Height: 6\' 1"  (1.854 m)    Physical Exam  Constitutional: He appears well-developed and well-nourished.  HENT:  Head: Normocephalic  and atraumatic.  Cardiovascular: Normal rate and regular rhythm.   No murmur heard. Pulses:      Dorsalis pedis pulses are 2+ on the right side, and 2+ on the left side.       Posterior tibial pulses are 2+ on the right side, and 2+ on the left side.  Pulmonary/Chest: Effort normal and breath sounds normal. He has no wheezes.  Musculoskeletal:       Right foot: There is no deformity.  Feet:  Right Foot:  Skin Integrity: Positive for callus. Negative for ulcer, blister or skin breakdown.  Left Foot:  Skin Integrity: Positive for callus. Negative for ulcer, blister or skin breakdown.    Reflexes - 2+ achilles Sensory- monofilament exam showed decrease sensation in great toes and ball of foot Monofilament  detected in all 5 sites tested   Assessment and Plan Cory Johnson was seen today for urinary frequency and numbness.  Diagnoses and all orders for this visit:  Urinary frequency- will get cultures  Reviewed lab results at time of visit -     POCT urinalysis dipstick -     POCT Microscopic Urinalysis ([facility]) -     Urine culture  Peripheral neuropathy, idiopathic- in 2016 b12 was in normal range Will recheck Will also screen for anemia Advised gabapentin and will titrate the dose -     Vitamin B12 -     CBC  Lower urinary tract symptoms (LUTS)- last psa was less than 3 -     PSA  Other orders -     gabapentin (NEURONTIN) 100 MG capsule; Take 1 capsule (100 mg total) by mouth 3 (three) times daily. Take 1 capsule by mouth at bedtime week 1 then increase weekly until taking 3 capsules by mouth at bedtime (week 3)  Follow up with PCP Dr. Patsy Lageropland for follow up test   Doristine BosworthZoe A Mende Biswell

## 2016-01-10 NOTE — Patient Instructions (Addendum)
Please start Gabapentin  Try Gabapentin 100mg  at bedtime for a week  By week 2 increase to 2 capsules (200mg ) at bedtime for a week By week 3 increase to 3 capsules (300mg ) at bedtime  If there are no side effects then please call me so that I can increase the dose of the capsule so you do not have to take so many pills.      IF you received an x-ray today, you will receive an invoice from [facility]. Please contact [facility] at [phone] with questions or concerns regarding your invoice.   IF you received labwork today, you will receive an invoice from [facility]. Please contact [facility] at [phone] with questions or concerns regarding your invoice.   Our billing staff will not be able to assist you with questions regarding bills from these companies.  You will be contacted with the lab results as soon as they are available. The fastest way to get your results is to activate your My Chart account. Instructions are located on the last page of this paperwork. If you have not heard from us regarding the results in 2 weeks, please contact this office.     Neuropathic Pain Neuropathic pain is pain caused by damage to the nerves that are responsible for certain sensations in your body (sensory nerves). The pain can be caused by damage to:   The sensory nerves that send signals to your spinal cord and brain (peripheral nervous system).  The sensory nerves in your brain or spinal cord (central nervous system). Neuropathic pain can make you more sensitive to pain. What would be a minor sensation for most people may feel very painful if you have neuropathic pain. This is usually a long-term condition that can be difficult to treat. The type of pain can differ from person to person. It may start suddenly (acute), or it may develop slowly and last for a long time (chronic). Neuropathic pain may come and go as damaged nerves heal or may stay at  the same level for years. It often causes emotional distress, loss of sleep, and a lower quality of life. CAUSES  The most common cause of damage to a sensory nerve is diabetes. Many other diseases and conditions can also cause neuropathic pain. Causes of neuropathic pain can be classified as:  Toxic. Many drugs and chemicals can cause toxic damage. The most common cause of toxic neuropathic pain is damage from drug treatment for cancer (chemotherapy).  Metabolic. This type of pain can happen when a disease causes imbalances that damage nerves. Diabetes is the most common of these diseases. Vitamin B deficiency caused by long-term alcohol abuse is another common cause.  Traumatic. Any injury that cuts, crushes, or stretches a nerve can cause damage and pain. A common example is feeling pain after losing an arm or leg (phantom limb pain).  Compression-related. If a sensory nerve gets trapped or compressed for a long period of time, the blood supply to the nerve can be cut off.  Vascular. Many blood vessel diseases can cause neuropathic pain by decreasing blood supply and oxygen to nerves.  Autoimmune. This type of pain results from diseases in which the body's defense system mistakenly attacks sensory nerves. Examples of autoimmune diseases that can cause neuropathic pain include lupus and multiple sclerosis.  Infectious. Many types of viral infections can damage sensory nerves and cause pain. Shingles infection is a common cause of this type of pain.  Inherited. Neuropathic pain can  be a symptom of many diseases that are passed down through families (genetic). SIGNS AND SYMPTOMS  The main symptom is pain. Neuropathic pain is often described as:  Burning.  Shock-like.  Stinging.  Hot or cold.  Itching. DIAGNOSIS  No single test can diagnose neuropathic pain. Your health care provider will do a physical exam and ask you about your pain. You may use a pain scale to describe how bad your  pain is. You may also have tests to see if you have a high sensitivity to pain and to help find the cause and location of any sensory nerve damage. These tests may include:  Imaging studies, such as:  X-rays.  CT scan.  MRI.  Nerve conduction studies to test how well nerve signals travel through your sensory nerves (electrodiagnostic testing).  Stimulating your sensory nerves through electrodes on your skin and measuring the response in your spinal cord and brain (somatosensory evoked potentials). TREATMENT  Treatment for neuropathic pain may change over time. You may need to try different treatment options or a combination of treatments. Some options include:  Over-the-counter pain relievers.  Prescription medicines. Some medicines used to treat other conditions may also help neuropathic pain. These include medicines to:  Control seizures (anticonvulsants).  Relieve depression (antidepressants).  Prescription-strength pain relievers (narcotics). These are usually used when other pain relievers do not help.  Transcutaneous nerve stimulation (TENS). This uses electrical currents to block painful nerve signals. The treatment is painless.  Topical and local anesthetics. These are medicines that numb the nerves. They can be injected as a nerve block or applied to the skin.  Alternative treatments, such as:  Acupuncture.  Meditation.  Massage.  Physical therapy.  Pain management programs.  Counseling. HOME CARE INSTRUCTIONS  Learn as much as you can about your condition.  Take medicines only as directed by your health care provider.  Work closely with all your health care providers to find what works best for you.  Have a good support system at home.  Consider joining a chronic pain support group. SEEK MEDICAL CARE IF:  Your pain treatments are not helping.  You are having side effects from your medicines.  You are struggling with fatigue, mood changes,  depression, or anxiety.   This information is not intended to replace advice given to you by your health care provider. Make sure you discuss any questions you have with your health care provider.   Document Released: 11/25/2003 Document Revised: 03/20/2014 Document Reviewed: 08/07/2013 Elsevier Interactive Patient Education [employer]2016 [employer]vier Inc.

## 2016-01-11 LAB — CBC
HCT: 46.5 % (ref 38.5–50.0)
Hemoglobin: 15 g/dL (ref 13.2–17.1)
MCH: 28.7 pg (ref 27.0–33.0)
MCHC: 32.3 g/dL (ref 32.0–36.0)
MCV: 88.9 fL (ref 80.0–100.0)
MPV: 9.4 fL (ref 7.5–12.5)
Platelets: 198 10*3/uL (ref 140–400)
RBC: 5.23 MIL/uL (ref 4.20–5.80)
RDW: 15 % (ref 11.0–15.0)
WBC: 6.8 10*3/uL (ref 3.8–10.8)

## 2016-01-11 LAB — URINE CULTURE

## 2016-01-23 ENCOUNTER — Encounter (HOSPITAL_COMMUNITY): Payer: Self-pay

## 2016-01-23 ENCOUNTER — Emergency Department (HOSPITAL_COMMUNITY)
Admission: EM | Admit: 2016-01-23 | Discharge: 2016-01-23 | Disposition: A | Discharge status: Home / Self Care | Payer: 59 | Attending: Emergency Medicine | Admitting: Emergency Medicine

## 2016-01-23 ENCOUNTER — Emergency Department (HOSPITAL_COMMUNITY): Payer: 59

## 2016-01-23 DIAGNOSIS — R55 Syncope and collapse: Secondary | ICD-10-CM | POA: Diagnosis not present

## 2016-01-23 LAB — CBC
HCT: 42.3 % (ref 39.0–52.0)
Hemoglobin: 13.9 g/dL (ref 13.0–17.0)
MCH: 28.5 pg (ref 26.0–34.0)
MCHC: 32.9 g/dL (ref 30.0–36.0)
MCV: 86.7 fL (ref 78.0–100.0)
Platelets: 208 10*3/uL (ref 150–400)
RBC: 4.88 MIL/uL (ref 4.22–5.81)
RDW: 14.3 % (ref 11.5–15.5)
WBC: 5.6 10*3/uL (ref 4.0–10.5)

## 2016-01-23 LAB — BASIC METABOLIC PANEL
Anion gap: 8 (ref 5–15)
BUN: 9 mg/dL (ref 6–20)
CO2: 28 mmol/L (ref 22–32)
Calcium: 8.8 mg/dL — ABNORMAL LOW (ref 8.9–10.3)
Chloride: 101 mmol/L (ref 101–111)
Creatinine, Ser: 0.87 mg/dL (ref 0.61–1.24)
GFR calc Af Amer: >60 mL/min (ref >60)
GFR calc non Af Amer: >60 mL/min (ref >60)
Glucose, Bld: 100 mg/dL — ABNORMAL HIGH (ref 65–99)
Potassium: 4.1 mmol/L (ref 3.5–5.1)
Sodium: 137 mmol/L (ref 135–145)

## 2016-01-23 LAB — CBG MONITORING, ED: Glucose-Capillary: 91 mg/dL (ref 65–99)

## 2016-01-23 LAB — URINALYSIS, ROUTINE W REFLEX MICROSCOPIC
Bilirubin Urine: NEGATIVE
Glucose, UA: NEGATIVE mg/dL
Hgb urine dipstick: NEGATIVE
Ketones, ur: NEGATIVE mg/dL
Leukocytes, UA: NEGATIVE
Nitrite: NEGATIVE
Protein, ur: NEGATIVE mg/dL
Specific Gravity, Urine: 1.011 (ref 1.005–1.030)
pH: 7.5 (ref 5.0–8.0)

## 2016-01-23 MED ORDER — SODIUM CHLORIDE 0.9 % IV BOLUS (SEPSIS)
1000.0000 mL | Freq: Once | INTRAVENOUS | Status: AC
  Administered 2016-01-23: 1000 mL via INTRAVENOUS

## 2016-01-23 NOTE — ED Notes (Signed)
CBG resulted: 91. RN notified.

## 2016-01-23 NOTE — Discharge Instructions (Signed)
Tests were good.  Follow up with neurologist.  Phone number given

## 2016-01-23 NOTE — ED Triage Notes (Signed)
Patient had syncopal event versus seizure earlier today. Patient states that he blanked out and when came to had fallen on knees. Denies injury. No incontinence, but reports increased tremors. Spouse states that he takes no meds for seizures. Alert and oriented on arrival

## 2016-01-23 NOTE — ED Provider Notes (Signed)
[facility]Y DEPT Provider Note   CSN: [id number] Arrival date & time: 01/23/16  1710     History   Chief Complaint Chief Complaint  Patient presents with  . syncope/seizure    HPI Cory Johnson is a 60 y.o. male.  Patient was walking with his wife earlier today when he had a syncopal episode. No prodromal event or illnesses. He apparently fell to his knees and has poor memory of the event. No residual chest pain, dyspnea, neurological deficits, slurred speech, arm or leg weakness. Wife reports slow gait, decreased volume of speech, increased tremors. Severity of symptoms is moderate.      Past Medical History:  Diagnosis Date  . Acute pulmonary embolus (HCC) 11/07/2009  . Arthritis    neck - limited range of motion; back, shoulders  . Chronic back pain   . Difficult intubation   . DVT of lower extremity (deep venous thrombosis) (HCC) 11/07/2009  . Right inguinal hernia 02/2011  . Seizures (HCC)   . Umbilical hernia 02/2011    Patient Active Problem List   Diagnosis Date Noted  . Memory loss 12/31/2013  . Back pain 08/18/2013  . Arthritis   . Myoclonic jerking   . Right inguinal hernia 03/17/2011  . Umbilical hernia 03/17/2011  . Acute pulmonary embolus (HCC) 11/07/2009  . DVT of lower extremity (deep venous thrombosis) (HCC) 11/07/2009    Past Surgical History:  Procedure Laterality Date  . HERNIA REPAIR    . INGUINAL HERNIA REPAIR  03/17/2011   Procedure: HERNIA REPAIR INGUINAL ADULT;  Surgeon: Ernestene MentionHaywood M Ingram, MD;  Location: [facility];  Service: General;  Laterality: Right;  right inguinal hernia repair with mesh  . KNEE SURGERY  1977   left  . UMBILICAL HERNIA REPAIR  03/17/2011   Procedure: HERNIA REPAIR UMBILICAL ADULT;  Surgeon: Ernestene MentionHaywood M Ingram, MD;  Location: [facility];  Service: General;  Laterality: N/A;  umbilical hernia repair with mesh       Home Medications    Prior to Admission medications     Medication Sig Start Date End Date Taking? Authorizing Provider  gabapentin (NEURONTIN) 100 MG capsule Take 1 capsule (100 mg total) by mouth 3 (three) times daily. Take 1 capsule by mouth at bedtime week 1 then increase weekly until taking 3 capsules by mouth at bedtime (week 3) Patient taking differently: Take 100 mg by mouth See admin instructions. Tapered course started 01/11/16: Take 1 capsule (100 mg) by mouth at bedtime for 1 week then increase weekly until taking 3 capsules by mouth at bedtime (week 3) 01/10/16  Yes Zoe A Creta LevinStallings, MD  HYDROcodone-acetaminophen (NORCO) 10-325 MG per tablet Take 1 tablet by mouth 5 (five) times daily. scheduled 03/09/11  Yes Historical Provider, MD    Family History No family history on file.  Social History Social History  Substance Use Topics  . Smoking status: Never Smoker  . Smokeless tobacco: Never Used  . Alcohol use No     Allergies   Patient has no known allergies.   Review of Systems Review of Systems  All other systems reviewed and are negative.    Physical Exam Updated Vital Signs BP 130/78   Pulse (!) 59   Temp 98.3 F (36.8 C) (Oral)   Resp 17   Ht 6\' 1"  (1.854 m)   Wt 213 lb (96.6 kg)   SpO2 100%   BMI 28.10 kg/m   Physical Exam  Constitutional: He is oriented to  person, place, and time.  Alert and oriented 3, but speech is very slow and determined.  Slumped in bed.  HENT:  Head: Normocephalic and atraumatic.  Eyes: Conjunctivae are normal.  Neck: Neck supple.  Cardiovascular: Normal rate and regular rhythm.   Pulmonary/Chest: Effort normal and breath sounds normal.  Abdominal: Soft. Bowel sounds are normal.  Musculoskeletal: Normal range of motion.  Neurological: He is alert and oriented to person, place, and time.  Patient moves all extremities, but sluggishly  Skin: Skin is warm and dry.  Psychiatric: He has a normal mood and affect. His behavior is normal.  Nursing note and vitals  reviewed.    ED Treatments / Results  Labs (all labs ordered are listed, but only abnormal results are displayed) Labs Reviewed  BASIC METABOLIC PANEL - Abnormal; Notable for the following:       Result Value   Glucose, Bld 100 (*)    Calcium 8.8 (*)    All other components within normal limits  CBC  URINALYSIS, ROUTINE W REFLEX MICROSCOPIC (NOT AT [facility])  CBG MONITORING, ED    EKG  EKG Interpretation None      Date: 01/23/2016  Rate: 71  Rhythm: normal sinus rhythm  QRS Axis: normal  Intervals: normal  ST/T Wave abnormalities: normal  Conduction Disutrbances: none  Narrative Interpretation: unremarkable     Radiology Ct Head Wo Contrast  Result Date: 01/23/2016 CLINICAL DATA:  Syncope and slurred speech EXAM: CT HEAD WITHOUT CONTRAST TECHNIQUE: Contiguous axial images were obtained from the base of the skull through the vertex without intravenous contrast. COMPARISON:  CT head 12/24/2013 FINDINGS: Brain: Thoracic kyphosis causing nonstandard imaging in the axial plane. Ventricle size normal. Negative for acute infarct, hemorrhage, mass lesion. Vascular: Negative for dense MCA Skull: Negative Sinuses/Orbits: Mild mucosal edema right maxillary sinus. Normal orbit. Other: None IMPRESSION: Negative CT of the head. Electronically Signed   By: Marlan Palauharles  Clark M.D.   On: 01/23/2016 20:20    Procedures Procedures (including critical care time)  Medications Ordered in ED Medications  sodium chloride 0.9 % bolus 1,000 mL (0 mLs Intravenous Stopped 01/23/16 2016)     Initial Impression / Assessment and Plan / ED Course  I have reviewed the triage vital signs and the nursing notes.  Pertinent labs & imaging results that were available during my care of the patient were reviewed by me and considered in my medical decision making (see chart for details).  Clinical Course     Screening tests including labs, EKG, CT head showed no acute findings. I'm concerned about the  diagnosis of Parkinson's disease. This was discussed with the patient and his wife. Referral to neurology.  Final Clinical Impressions(s) / ED Diagnoses   Final diagnoses:  Syncope, unspecified syncope type    New Prescriptions New Prescriptions   No medications on file     Donnetta HutchingBrian Issaih Kaus, MD 01/23/16 2122

## 2016-03-20 ENCOUNTER — Ambulatory Visit (INDEPENDENT_AMBULATORY_CARE_PROVIDER_SITE_OTHER): Payer: 59 | Admitting: Emergency Medicine

## 2016-03-20 VITALS — BP 118/70 | HR 78 | Temp 97.9°F | Resp 18 | Ht 73.0 in | Wt 211.2 lb

## 2016-03-20 DIAGNOSIS — J069 Acute upper respiratory infection, unspecified: Secondary | ICD-10-CM | POA: Diagnosis not present

## 2016-03-20 DIAGNOSIS — R05 Cough: Secondary | ICD-10-CM

## 2016-03-20 DIAGNOSIS — G894 Chronic pain syndrome: Secondary | ICD-10-CM

## 2016-03-20 DIAGNOSIS — R059 Cough, unspecified: Secondary | ICD-10-CM

## 2016-03-20 DIAGNOSIS — M1990 Unspecified osteoarthritis, unspecified site: Secondary | ICD-10-CM | POA: Diagnosis not present

## 2016-03-20 DIAGNOSIS — J209 Acute bronchitis, unspecified: Secondary | ICD-10-CM | POA: Insufficient documentation

## 2016-03-20 MED ORDER — AMOXICILLIN-POT CLAVULANATE 875-125 MG PO TABS: 1.0000 | ORAL_TABLET | Freq: Two times a day (BID) | ORAL | 0 refills | Status: AC

## 2016-03-20 NOTE — Progress Notes (Signed)
Cory Johnson 61 y.o.   Chief Complaint  Patient presents with  . Cough  . BLOODWORK  ON LIVER TESTING    HISTORY OF PRESENT ILLNESS: This is a 61 y.o. male complaining of productive cough, "like a whooping cough"; aslso states he needs blood work because he is on Hydrocodone.  Cough  This is a new problem. The current episode started in the past 7 days. The problem has been waxing and waning. The problem occurs hourly. The cough is productive of sputum. Associated symptoms include a fever and nasal congestion. Pertinent negatives include no chest pain, chills, ear congestion, ear pain, headaches, hemoptysis, myalgias, rash, rhinorrhea, sore throat, shortness of breath, weight loss or wheezing. He has tried nothing for the symptoms.     Prior to Admission medications   Medication Sig Start Date End Date Taking? Authorizing Provider  HYDROcodone-acetaminophen (NORCO) 10-325 MG per tablet Take 1 tablet by mouth 5 (five) times daily. scheduled 03/09/11  Yes Historical Provider, MD  amoxicillin-clavulanate (AUGMENTIN) 875-125 MG tablet Take 1 tablet by mouth 2 (two) times daily. 03/20/16 03/27/16  Georgina QuintMiguel Jose Cheryllynn Sarff, MD  gabapentin (NEURONTIN) 100 MG capsule Take 1 capsule (100 mg total) by mouth 3 (three) times daily. Take 1 capsule by mouth at bedtime week 1 then increase weekly until taking 3 capsules by mouth at bedtime (week 3) Patient not taking: Reported on 03/20/2016 01/10/16   Doristine BosworthZoe A Stallings, MD    No Known Allergies  Patient Active Problem List   Diagnosis Date Noted  . Memory loss 12/31/2013  . Back pain 08/18/2013  . Arthritis   . Myoclonic jerking   . Right inguinal hernia 03/17/2011  . Umbilical hernia 03/17/2011  . Acute pulmonary embolus (HCC) 11/07/2009  . DVT of lower extremity (deep venous thrombosis) (HCC) 11/07/2009    Past Medical History:  Diagnosis Date  . Acute pulmonary embolus (HCC) 11/07/2009  . Arthritis    neck - limited range of motion; back,  shoulders  . Chronic back pain   . Difficult intubation   . DVT of lower extremity (deep venous thrombosis) (HCC) 11/07/2009  . Right inguinal hernia 02/2011  . Seizures (HCC)   . Umbilical hernia 02/2011    Past Surgical History:  Procedure Laterality Date  . HERNIA REPAIR    . INGUINAL HERNIA REPAIR  03/17/2011   Procedure: HERNIA REPAIR INGUINAL ADULT;  Surgeon: Ernestene MentionHaywood M Ingram, MD;  Location: [facility];  Service: General;  Laterality: Right;  right inguinal hernia repair with mesh  . KNEE SURGERY  1977   left  . UMBILICAL HERNIA REPAIR  03/17/2011   Procedure: HERNIA REPAIR UMBILICAL ADULT;  Surgeon: Ernestene MentionHaywood M Ingram, MD;  [facility];  Service: General;  Laterality: N/A;  umbilical hernia repair with mesh    Social History   Social History  . Marital status: Married    Spouse name: Cory Johnson  . Number of children: 5  . Years of education: college   Occupational History  . DIRECTOR [employer]   Social History Main Topics  . Smoking status: Never Smoker  . Smokeless tobacco: Never Used  . Alcohol use No  . Drug use: No  . Sexual activity: Yes    Birth control/ protection: Surgical   Other Topics Concern  . Not on file   Social History Narrative   Patient lives at home with his wife Cory Spurr([name]).   Disabled.   Education college.   Right handed.  Caffeine one cup of coffee daily.    History reviewed. No pertinent family history.   Review of Systems  Constitutional: Positive for fever. Negative for chills and weight loss.  HENT: Positive for congestion. Negative for ear pain, nosebleeds, rhinorrhea and sore throat.   Eyes: Negative.   Respiratory: Positive for cough. Negative for hemoptysis, shortness of breath and wheezing.   Cardiovascular: Negative for chest pain.  Gastrointestinal: Negative for abdominal pain, diarrhea, nausea and vomiting.  Genitourinary: Negative.   Musculoskeletal: Negative for  myalgias.  Skin: Negative for rash.  Neurological: Negative for headaches.  Endo/Heme/Allergies: Negative.   Psychiatric/Behavioral: Negative.   All other systems reviewed and are negative.   Vitals:   03/20/16 1208  BP: 118/70  Pulse: 78  Resp: 18  Temp: 97.9 F (36.6 C)    Physical Exam  Constitutional: He is oriented to person, place, and time. He appears well-developed and well-nourished.  HENT:  Head: Normocephalic and atraumatic.  Nose: Mucosal edema present.  Mouth/Throat: Mucous membranes are normal. Posterior oropharyngeal erythema present. No oropharyngeal exudate.  Eyes: Conjunctivae and EOM are normal. Pupils are equal, round, and reactive to light.  Neck:  Severely restricted neck ROM secondary to arthritis  Cardiovascular: Normal rate, regular rhythm and normal heart sounds.   Pulmonary/Chest: Effort normal and breath sounds normal.  Abdominal: Soft. He exhibits no distension. There is no tenderness.  Musculoskeletal: Normal range of motion.  Neurological: He is alert and oriented to person, place, and time.  Skin: Skin is warm and dry. Capillary refill takes less than 2 seconds.  Psychiatric: He has a normal mood and affect. His behavior is normal.  Vitals reviewed.    ASSESSMENT & PLAN: Cory Johnson was seen today for cough and bloodwork  on liver testing.  Diagnoses and all orders for this visit:  Acute bronchitis, unspecified organism  Acute upper respiratory infection -     CBC with Differential/Platelet -     Comprehensive metabolic panel  Cough  Chronic pain syndrome  Arthritis  Other orders -     amoxicillin-clavulanate (AUGMENTIN) 875-125 MG tablet; Take 1 tablet by mouth 2 (two) times daily.    Patient Instructions       IF you received an x-ray today, you will receive an invoice from [facility]. Please contact [facility] at [phone] with questions or concerns regarding your invoice.   IF you received  labwork today, you will receive an invoice from [facility]. Please contact [facility] at [phone] with questions or concerns regarding your invoice.   Our billing staff will not be able to assist you with questions regarding bills from these companies.  You will be contacted with the lab results as soon as they are available. The fastest way to get your results is to activate your My Chart account. Instructions are located on the last page of this paperwork. If you have not heard from [place] regarding the results in 2 weeks, please contact this office.     Acute Bronchitis, Adult Acute bronchitis is when air tubes (bronchi) in the lungs suddenly get swollen. The condition can make it hard to breathe. It can also cause these symptoms:  A cough.  Coughing up clear, yellow, or green mucus.  Wheezing.  Chest congestion.  Shortness of breath.  A fever.  Body aches.  Chills.  A sore throat. Follow these instructions at home: Medicines  Take over-the-counter and prescription medicines only as told by your doctor.  If you were prescribed an antibiotic medicine,  take it as told by your doctor. Do not stop taking the antibiotic even if you start to feel better. General instructions  Rest.  Drink enough fluids to keep your pee (urine) clear or pale yellow.  Avoid smoking and secondhand smoke. If you smoke and you need help quitting, ask your doctor. Quitting will help your lungs heal faster.  Use an inhaler, cool mist vaporizer, or humidifier as told by your doctor.  Keep all follow-up visits as told by your doctor. This is important. How is this prevented? To lower your risk of getting this condition again:  Wash your hands often with soap and water. If you cannot use soap and water, use hand sanitizer.  Avoid contact with people who have cold symptoms.  Try not to touch your hands to your mouth, nose, or eyes.  Make sure to get the flu shot every year. Contact a doctor  if:  Your symptoms do not get better in 2 weeks. Get help right away if:  You cough up blood.  You have chest pain.  You have very bad shortness of breath.  You become dehydrated.  You faint (pass out) or keep feeling like you are going to pass out.  You keep throwing up (vomiting).  You have a very bad headache.  Your fever or chills gets worse. This information is not intended to replace advice given to you by your health care provider. Make sure you discuss any questions you have with your health care provider. Document Released: 08/16/2007 Document Revised: 10/06/2015 Document Reviewed: 08/18/2015 Elsevier Interactive Patient Education  2017 Elsevier Inc.     Edwina Barth, MD [facility]

## 2016-03-20 NOTE — Patient Instructions (Addendum)
     IF you received an x-ray today, you will receive an invoice from [facility]. Please contact [facility] at [phone] with questions or concerns regarding your invoice.   IF you received labwork today, you will receive an invoice from [facility]. Please contact [facility] at [phone] with questions or concerns regarding your invoice.   Our billing staff will not be able to assist you with questions regarding bills from these companies.  You will be contacted with the lab results as soon as they are available. The fastest way to get your results is to activate your My Chart account. Instructions are located on the last page of this paperwork. If you have not heard from us regarding the results in 2 weeks, please contact this office.      Acute Bronchitis, Adult Acute bronchitis is when air tubes (bronchi) in the lungs suddenly get swollen. The condition can make it hard to breathe. It can also cause these symptoms:  A cough.  Coughing up clear, yellow, or green mucus.  Wheezing.  Chest congestion.  Shortness of breath.  A fever.  Body aches.  Chills.  A sore throat.  Follow these instructions at home: Medicines  Take over-the-counter and prescription medicines only as told by your doctor.  If you were prescribed an antibiotic medicine, take it as told by your doctor. Do not stop taking the antibiotic even if you start to feel better. General instructions  Rest.  Drink enough fluids to keep your pee (urine) clear or pale yellow.  Avoid smoking and secondhand smoke. If you smoke and you need help quitting, ask your doctor. Quitting will help your lungs heal faster.  Use an inhaler, cool mist vaporizer, or humidifier as told by your doctor.  Keep all follow-up visits as told by your doctor. This is important. How is this prevented? To lower your risk of getting this condition again:  Wash your hands often with soap and water. If you cannot  use soap and water, use hand sanitizer.  Avoid contact with people who have cold symptoms.  Try not to touch your hands to your mouth, nose, or eyes.  Make sure to get the flu shot every year.  Contact a doctor if:  Your symptoms do not get better in 2 weeks. Get help right away if:  You cough up blood.  You have chest pain.  You have very bad shortness of breath.  You become dehydrated.  You faint (pass out) or keep feeling like you are going to pass out.  You keep throwing up (vomiting).  You have a very bad headache.  Your fever or chills gets worse. This information is not intended to replace advice given to you by your health care provider. Make sure you discuss any questions you have with your health care provider. Document Released: 08/16/2007 Document Revised: 10/06/2015 Document Reviewed: 08/18/2015 Elsevier Interactive Patient Education  2017 Elsevier Inc.  

## 2016-03-21 LAB — COMPREHENSIVE METABOLIC PANEL
ALT: 7 IU/L (ref 0–44)
AST: 11 IU/L (ref 0–40)
Albumin/Globulin Ratio: 1.3 (ref 1.2–2.2)
Albumin: 4.1 g/dL (ref 3.6–4.8)
Alkaline Phosphatase: 89 IU/L (ref 39–117)
BUN/Creatinine Ratio: 12 (ref 10–24)
BUN: 10 mg/dL (ref 8–27)
Bilirubin Total: 0.6 mg/dL (ref 0.0–1.2)
CO2: 29 mmol/L (ref 18–29)
Calcium: 9.2 mg/dL (ref 8.6–10.2)
Chloride: 98 mmol/L (ref 96–106)
Creatinine, Ser: 0.82 mg/dL (ref 0.76–1.27)
GFR calc Af Amer: 110 mL/min/{1.73_m2} (ref >59)
GFR calc non Af Amer: 95 mL/min/{1.73_m2} (ref >59)
Globulin, Total: 3.2 g/dL (ref 1.5–4.5)
Glucose: 86 mg/dL (ref 65–99)
Potassium: 4.5 mmol/L (ref 3.5–5.2)
Sodium: 142 mmol/L (ref 134–144)
Total Protein: 7.3 g/dL (ref 6.0–8.5)

## 2016-03-21 LAB — CBC WITH DIFFERENTIAL/PLATELET
Basophils Absolute: 0 10*3/uL (ref 0.0–0.2)
Basos: 0 %
EOS (ABSOLUTE): 0.2 10*3/uL (ref 0.0–0.4)
Eos: 3 %
Hematocrit: 44.5 % (ref 37.5–51.0)
Hemoglobin: 13.9 g/dL (ref 13.0–17.7)
Immature Grans (Abs): 0 10*3/uL (ref 0.0–0.1)
Immature Granulocytes: 0 %
Lymphocytes Absolute: 1.1 10*3/uL (ref 0.7–3.1)
Lymphs: 15 %
MCH: 27.3 pg (ref 26.6–33.0)
MCHC: 31.2 g/dL — ABNORMAL LOW (ref 31.5–35.7)
MCV: 87 fL (ref 79–97)
Monocytes Absolute: 0.6 10*3/uL (ref 0.1–0.9)
Monocytes: 8 %
Neutrophils Absolute: 5.4 10*3/uL (ref 1.4–7.0)
Neutrophils: 74 %
Platelets: 207 10*3/uL (ref 150–379)
RBC: 5.09 x10E6/uL (ref 4.14–5.80)
RDW: 14.4 % (ref 12.3–15.4)
WBC: 7.4 10*3/uL (ref 3.4–10.8)

## 2016-05-16 DIAGNOSIS — G894 Chronic pain syndrome: Secondary | ICD-10-CM | POA: Diagnosis not present

## 2016-05-16 DIAGNOSIS — M5136 Other intervertebral disc degeneration, lumbar region: Secondary | ICD-10-CM | POA: Diagnosis not present

## 2016-05-16 DIAGNOSIS — M542 Cervicalgia: Secondary | ICD-10-CM | POA: Diagnosis not present

## 2016-05-29 DIAGNOSIS — Z1211 Encounter for screening for malignant neoplasm of colon: Secondary | ICD-10-CM | POA: Diagnosis not present

## 2016-06-09 ENCOUNTER — Ambulatory Visit (INDEPENDENT_AMBULATORY_CARE_PROVIDER_SITE_OTHER): Payer: 59 | Admitting: Nurse Practitioner

## 2016-06-09 ENCOUNTER — Encounter: Payer: Self-pay | Admitting: Nurse Practitioner

## 2016-06-09 VITALS — BP 142/94 | HR 60 | Ht 73.0 in | Wt 211.4 lb

## 2016-06-09 DIAGNOSIS — R269 Unspecified abnormalities of gait and mobility: Secondary | ICD-10-CM

## 2016-06-09 DIAGNOSIS — R413 Other amnesia: Secondary | ICD-10-CM | POA: Diagnosis not present

## 2016-06-09 MED ORDER — DONEPEZIL HCL 5 MG PO TABS: ORAL_TABLET | ORAL | 3 refills | Status: DC

## 2016-06-09 NOTE — Progress Notes (Signed)
I have reviewed and agreed above plan. 

## 2016-06-09 NOTE — Progress Notes (Signed)
[facility]  PATIENT: Cory Johnson DOB: April 05, 1955   REASON FOR VISIT: Follow-up for history of memory disorder, chronic pain syndrome on narcotics, REM sleep disorder HISTORY FROM:patient and wife Cory Johnson    HISTORY OF PRESENT ILLNESS: HISTORY YYHe had a history of cervical fusion, due to advanced degenerative disc disease, presenting with progressive anterocollis, severe limited range of motion of his neck muscles  I saw him previously for  frequent nocturnal jerking movements.  He described that he had frequent transient jerking movements of his body while he was falling into sleep, he was able to quickly drifting back asleep again.   He also also has excessive movement  during the night sleep, kicking, moving his limbs, occasionally talking, cause a lot of complains from his wife.  he often dreamed of playing basketball.   He denied excessive snoring, or stop breathing during the the snoring.  He also complain excessive daytime fatigue, especially during the evening time, he would easily doze off sitting at the  meetings or watching TV.    He had sleep study in October 2013, which indeed demonstrate REM sleep disorder. There is no sleep apnea.  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 CT scan of the brain was normal. He  continued to complains of neck pain, REM sleep disorder, frequent awakening during nighttime because of the neck pain, he is exercising regularly UPDATE 03/30/2018CM Cory Johnson, 61 year old male returns for follow-up. Last visit to this office October 2015. Has history of memory loss. CT of the brain normal in the past. Wife reports today short-term memory is getting worse. He start projects but doesn't finish some is forgetful cannot remember where he puts his keys. He continues to drive a car and so far has not gotten lost or had any accidents. Wife works full time, patient is disabled for several years due to his cervical spine disorder. He continues to Johnson to work out several times a week, lifts weights, walks on the treadmill etc. No recent falls, does not use an assistive device. Denies loss of bowel or bladder control. His only medication currently is hydrocodone from his pain specialist. He had a syncopal episode in November 2017 seen in the emergency room. Wife reported at that time tremors problems with gait. He returns for reevaluation  REVIEW OF SYSTEMS: Full 14 system review of systems performed and notable only for those listed, all others are neg:  Constitutional: neg  Cardiovascular: neg Ear/Nose/Throat: neg  Skin: neg Eyes: neg Respiratory: neg Gastroitestinal: Urinary urgency Hematology/Lymphatic: neg  Endocrine: Intolerance to cold Musculoskeletal: Neck pain, arthritis Allergy/Immunology: neg Neurological: Memory loss Psychiatric: neg Sleep : REM sleep disorder   ALLERGIES: No Known Allergies  HOME MEDICATIONS: Outpatient Medications Prior to Visit  Medication Sig Dispense Refill  . HYDROcodone-acetaminophen (NORCO) 10-325 MG per tablet Take 1 tablet by mouth 5 (five) times  daily. scheduled    . gabapentin (NEURONTIN) 100 MG capsule Take 1 capsule (100 mg total) by mouth 3 (three) times daily. Take 1 capsule by mouth at bedtime week 1 then increase weekly until taking 3  capsules by mouth at bedtime (week 3) (Patient not taking: Reported on 03/20/2016) 90 capsule 0   No facility-administered medications prior to visit.     PAST MEDICAL HISTORY: Past Medical History:  Diagnosis Date  . Acute pulmonary embolus (HCC) 11/07/2009  . Arthritis    neck - limited range of motion; back, shoulders  . Chronic back pain   . Difficult intubation   . DVT of lower extremity (deep venous thrombosis) (HCC) 11/07/2009  . Right inguinal hernia 02/2011  . Seizures (HCC)   . Umbilical hernia 02/2011    PAST SURGICAL HISTORY: Past Surgical History:  Procedure Laterality Date  . HERNIA REPAIR    . INGUINAL HERNIA REPAIR  03/17/2011   Procedure: HERNIA REPAIR INGUINAL ADULT;  Surgeon: Ernestene Mention, MD;  Location: [facility];  Service: General;  Laterality: Right;  right inguinal hernia repair with mesh  . KNEE SURGERY  1977   left  . UMBILICAL HERNIA REPAIR  03/17/2011   Procedure: HERNIA REPAIR UMBILICAL ADULT;  Surgeon: Ernestene Mention, MD;  Location: [facility];  Service: General;  Laterality: N/A;  umbilical hernia repair with mesh    FAMILY HISTORY: History reviewed. No pertinent family history.  SOCIAL HISTORY: Social History   Social History  . Marital status: Married    Spouse name: Cory Johnson  . Number of children: 5  . Years of education: college   Occupational History  . DIRECTOR [employer]   Social History Main Topics  . Smoking status: Never Smoker  . Smokeless tobacco: Never Used  . Alcohol use No  . Drug use: No  . Sexual activity: Yes    Birth control/ protection: Surgical   Other Topics Concern  . Not on file   Social History Narrative   Patient lives at home with his wife Cory Johnson).   Disabled.   Education college.   Right handed.   Caffeine one cup of coffee daily.     PHYSICAL EXAM  Vitals:   06/09/16 0747  BP: (!) 142/94  Pulse: 60  Weight: 211 lb 6.4 oz (95.9 kg)  Height: 6'  1" (1.854 m)   Body mass index is 27.89 kg/m.  Generalized: Well developed, Mildly obese male in no acute distress , well-groomed, decreased facial expression Head: normocephalic and atraumatic,. Oropharynx benign  Neck: profound anterocolis, limited range of motion to neck muscles  Cardiac: Regular rate rhythm,   Neurological examination   Mentation: Alert oriented to time, place, history taking. MMSE 28/30 missing items in orientation and 1/3 recall Last 30/30.   Follows all commands speech is slowed and language fluent.   Cranial nerve II-XII: .Pupils were equal round reactive to light extraocular movements were full, visual field were full on confrontational test. Facial sensation and strength were normal. hearing was intact to finger rubbing bilaterally. Uvula tongue midline. head turning and shoulder shrug were normal and symmetric.Tongue protrusion into cheek strength was normal. Motor: normal bulk and tone, full strength in the BUE, BLE, Sensory: normal and symmetric to light touch, pinprick, and  Vibration, in the upper and lower extremities  Coordination: finger-nose-finger, heel-to-shin bilaterally, no dysmetria, intermittent head tremor Reflexes: Brachioradialis 2/2, biceps 2/2, triceps 2/2, patellar 2/2, Achilles 2/2, plantar responses were flexor  bilaterally. Gait and Station: Rising up from seated position without assistance, profound antercollis, moderate stride, mild decreased  arm swing, smooth turning, no assistive device DIAGNOSTIC DATA (LABS, IMAGING, TESTING) - I reviewed patient records, labs, notes, testing and imaging myself where available.  Lab Results  Component Value Date   WBC 7.4 03/20/2016   HGB 13.9 01/23/2016   HCT 44.5 03/20/2016   MCV 87 03/20/2016   PLT 207 03/20/2016      Component Value Date/Time   NA 142 03/20/2016 1258   K 4.5 03/20/2016 1258   CL 98 03/20/2016 1258   CO2 29 03/20/2016 1258   GLUCOSE 86 03/20/2016 1258   GLUCOSE 100 (H)  01/23/2016 1723   BUN 10 03/20/2016 1258   CREATININE 0.82 03/20/2016 1258   CREATININE 0.74 06/10/2014 1409   CALCIUM 9.2 03/20/2016 1258   PROT 7.3 03/20/2016 1258   ALBUMIN 4.1 03/20/2016 1258   AST 11 03/20/2016 1258   ALT 7 03/20/2016 1258   ALKPHOS 89 03/20/2016 1258   BILITOT 0.6 03/20/2016 1258   GFRNONAA 95 03/20/2016 1258   GFRAA 110 03/20/2016 1258     Lab Results  Component Value Date   VITAMINB12 453 01/10/2016       ASSESSMENT AND PLAN  61 y.o. year old male  with past medical history of cervical fusion due to advanced cervical degenerative disc disease, severe limited range of motion of the cervical muscles, also had past medical history of REM sleep disorder, now with mild memory trouble. Differential diagnosis mild cognitive impairment versus early central nervous system degenerative disorder, and some Parkinson symptoms seen on exam.   PLAN: MMSE 28/30.  Begin Aricept 5 mg daily for two weeks then increase to 2 tabs daily  Medication may cause nausea may take with food.  Continue moderate exercise program Follow up in 3 months with Dr. Terrace Arabia Greater than 50% of time during this 25 minute visit was spent on counseling,explanation of diagnosis, reviewing previous medical records planning of further management, discussion with patient and wife  and coordination of care. Wife is concerned that patient may have Parkinson's disease and I made her aware he cannot be diagnosed based on one exam. Made her aware that it is important to follow-up with Dr. Terrace Arabia.   Nilda Riggs, [facility], [facility], APRN  [facility] [address] [place], [place] [address] [phone]

## 2016-06-09 NOTE — Patient Instructions (Addendum)
MMSE 28/30.  Begin Aricept 5 mg daily for two weeks then increase to 2 tabs daily  Medication may cause nausea may take with food.  Moderate exercise to continue Follow up in 3 months with Dr. Terrace Arabia

## 2016-06-22 ENCOUNTER — Telehealth: Payer: Self-pay | Admitting: Emergency Medicine

## 2016-06-22 ENCOUNTER — Ambulatory Visit (INDEPENDENT_AMBULATORY_CARE_PROVIDER_SITE_OTHER): Payer: 59 | Admitting: Emergency Medicine

## 2016-06-22 VITALS — BP 135/82 | HR 65 | Temp 98.6°F | Resp 17 | Ht 73.0 in | Wt 211.0 lb

## 2016-06-22 DIAGNOSIS — R251 Tremor, unspecified: Secondary | ICD-10-CM | POA: Diagnosis not present

## 2016-06-22 DIAGNOSIS — Z800 Family history of malignant neoplasm of digestive organs: Secondary | ICD-10-CM | POA: Insufficient documentation

## 2016-06-22 DIAGNOSIS — K117 Disturbances of salivary secretion: Secondary | ICD-10-CM | POA: Diagnosis not present

## 2016-06-22 DIAGNOSIS — R634 Abnormal weight loss: Secondary | ICD-10-CM | POA: Diagnosis not present

## 2016-06-22 NOTE — Progress Notes (Signed)
Cory Johnson 61 y.o.   Chief Complaint  Patient presents with  . Tremors    Right arm onset 1 month  . Weight Loss  . Drooling    Especially at night/ voice change, concerned about throat cancer, runs in family    HISTORY OF PRESENT ILLNESS: This is a 61 y.o. male with multiple complaints; right arm tremors, drooling at night, voice change, wt loss; has FHx of throat cancer.  HPI   Prior to Admission medications   Medication Sig Start Date End Date Taking? Authorizing Provider  donepezil (ARICEPT) 5 MG tablet 1 tab daily for 2 weeks then increase to 2 tabs 06/09/16  Yes Nilda Riggs, NP  HYDROcodone-acetaminophen (NORCO) 10-325 MG per tablet Take 1 tablet by mouth 5 (five) times daily. scheduled 03/09/11  Yes Historical Provider, MD    No Known Allergies  Patient Active Problem List   Diagnosis Date Noted  . Abnormality of gait 06/09/2016  . Acute bronchitis 03/20/2016  . Memory loss 12/31/2013  . Back pain 08/18/2013  . Arthritis   . Myoclonic jerking   . Right inguinal hernia 03/17/2011  . Umbilical hernia 03/17/2011  . Acute pulmonary embolus (HCC) 11/07/2009  . DVT of lower extremity (deep venous thrombosis) (HCC) 11/07/2009    Past Medical History:  Diagnosis Date  . Acute pulmonary embolus (HCC) 11/07/2009  . Arthritis    neck - limited range of motion; back, shoulders  . Chronic back pain   . Difficult intubation   . DVT of lower extremity (deep venous thrombosis) (HCC) 11/07/2009  . Right inguinal hernia 02/2011  . Seizures (HCC)   . Umbilical hernia 02/2011    Past Surgical History:  Procedure Laterality Date  . HERNIA REPAIR    . INGUINAL HERNIA REPAIR  03/17/2011   Procedure: HERNIA REPAIR INGUINAL ADULT;  Surgeon: Ernestene Mention, MD;  Location: [facility];  Service: General;  Laterality: Right;  right inguinal hernia repair with mesh  . KNEE SURGERY  1977   left  . UMBILICAL HERNIA REPAIR  03/17/2011   Procedure: HERNIA  REPAIR UMBILICAL ADULT;  Surgeon: Ernestene Mention, MD;  Location: [facility];  Service: General;  Laterality: N/A;  umbilical hernia repair with mesh    Social History   Social History  . Marital status: Married    Spouse name: [name]  . Number of children: 5  . Years of education: college   Occupational History  . DIRECTOR [employer]   Social History Main Topics  . Smoking status: Never Smoker  . Smokeless tobacco: Never Used  . Alcohol use No  . Drug use: No  . Sexual activity: Yes    Birth control/ protection: Surgical   Other Topics Concern  . Not on file   Social History Narrative   Patient lives at home with his wife [name].   Disabled.   Education college.   Right handed.   Caffeine one cup of coffee daily.    No family history on file.   Review of Systems  Constitutional: Positive for weight loss. Negative for chills and fever.  HENT: Negative for congestion, ear pain, hearing loss, nosebleeds, sinus pain and sore throat.        Drooling at night; voice change  Eyes: Negative for blurred vision, double vision and redness.  Respiratory: Negative for cough, hemoptysis, shortness of breath and wheezing.   Cardiovascular: Negative for chest pain, palpitations and leg swelling.  Gastrointestinal:  Negative for abdominal pain, blood in stool, diarrhea, melena, nausea and vomiting.  Genitourinary: Negative for dysuria and hematuria.  Musculoskeletal: Positive for neck pain. Negative for myalgias.  Skin: Negative for rash.  Neurological: Positive for tremors and weakness. Negative for dizziness, sensory change, focal weakness, seizures, loss of consciousness and headaches.  Endo/Heme/Allergies: Negative for polydipsia. Does not bruise/bleed easily.  All other systems reviewed and are negative.  Vitals:   06/22/16 1706  BP: 135/82  Pulse: 65  Resp: 17  Temp: 98.6 F (37 C)     Physical Exam  Constitutional: He is oriented  to person, place, and time. He appears well-developed and well-nourished.  HENT:  Head: Normocephalic and atraumatic.  Nose: Nose normal.  Mouth/Throat: Oropharynx is clear and moist.  Eyes: Conjunctivae and EOM are normal. Pupils are equal, round, and reactive to light.  Neck: No JVD present. No thyromegaly present.  Chronic cervical spine deformity; head in flexed position; unable to extend  Cardiovascular: Normal rate, regular rhythm and normal heart sounds.   Pulmonary/Chest: Effort normal and breath sounds normal.  Abdominal: Soft. He exhibits no distension. There is no tenderness.  Musculoskeletal:  Some muscle atrophy to hands noted  Lymphadenopathy:    He has no cervical adenopathy.  Neurological: He is alert and oriented to person, place, and time. He displays normal reflexes. No sensory deficit. He exhibits normal muscle tone. Coordination normal.  +right arm tremor(marked)  Skin: Skin is warm and dry. Capillary refill takes less than 2 seconds.  Psychiatric: He has a normal mood and affect. His behavior is normal.  Vitals reviewed.    ASSESSMENT & PLAN: Multiple concerns including malignancy. Also primary neurological condition highly suspected. Needs workup and consultations. [name] was seen today for tremors, weight loss and drooling.  Diagnoses and all orders for this visit:  Tremors of nervous system -     Ambulatory referral to Neurology -     CT Head Wo Contrast; Future  Drooling -     CT SOFT TISSUE NECK WO CONTRAST; Future  Loss of weight -     Ambulatory referral to ENT -     CT SOFT TISSUE NECK WO CONTRAST; Future  Family history of throat cancer -     Ambulatory referral to ENT -     CT SOFT TISSUE NECK WO CONTRAST; Future   Patient Instructions       IF you received an x-ray today, you will receive an invoice from [facility]. Please contact [facility] at [phone] with questions or concerns regarding your invoice.    IF you received labwork today, you will receive an invoice from [place]. Please contact [facility] at [phone] with questions or concerns regarding your invoice.   Our billing staff will not be able to assist you with questions regarding bills from these companies.  You will be contacted with the lab results as soon as they are available. The fastest way to get your results is to activate your My Chart account. Instructions are located on the last page of this paperwork. If you have not heard from [place] regarding the results in 2 weeks, please contact this office.    Tremor A tremor is trembling or shaking that you cannot control. Most tremors affect the hands or arms. Tremors can also affect the head, vocal cords, face, and other parts of the body. There are many types of tremors. Common types include:  Essential tremor. These usually occur in people over the age of 29.  It may run in families and can happen in otherwise healthy people.  Resting tremor. These occur when the muscles are at rest, such as when your hands are resting in your lap. People with Parkinson disease often have resting tremors.  Postural tremor. These occur when you try to hold a pose, such as keeping your hands outstretched.  Kinetic tremor. These occur during purposeful movement, such as trying to touch a finger to your nose.  Task-specific tremor. These may occur when you perform tasks such as handwriting, speaking, or standing.  Psychogenic tremor. These dramatically lessen or disappear when you are distracted. They can happen in people of all ages. Some types of tremors have no known cause. Tremors can also be a symptom of nervous system problems (neurological disorders) that may occur with aging. Some tremors go away with treatment while others do not. Follow these instructions at home: Watch your tremor for any changes. The following actions may help to lessen any discomfort you are feeling:  Take medicines  only as directed by your health care provider.  Limit alcohol intake to no more than 1 drink per day for nonpregnant women and 2 drinks per day for men. One drink equals 12 oz of beer, 5 oz of wine, or 1 oz of hard liquor.  Do not use any tobacco products, including cigarettes, chewing tobacco, or electronic cigarettes. If you need help quitting, ask your health care provider.  Avoid extreme heat or cold.  Limit the amount of caffeine you consumeas directed by your health care provider.  Try to get 8 hours of sleep each night.  Find ways to manage your stress, such as meditation or yoga.  Keep all follow-up visits as directed by your health care provider. This is important. Contact a health care provider if:  You start having a tremor after starting a new medicine.  You have tremor with other symptoms such as:  Numbness.  Tingling.  Pain.  Weakness.  Your tremor gets worse.  Your tremor interferes with your day-to-day life. This information is not intended to replace advice given to you by your health care provider. Make sure you discuss any questions you have with your health care provider. Document Released: 02/17/2002 Document Revised: 10/31/2015 Document Reviewed: 08/25/2013 Elsevier Interactive Patient Education  2017 Elsevier Inc.     Edwina Barth, MD [facility]

## 2016-06-22 NOTE — Patient Instructions (Addendum)
     IF you received an x-ray today, you will receive an invoice from [facility]. Please contact [facility] at [phone] with questions or concerns regarding your invoice.   IF you received labwork today, you will receive an invoice from [facility]. Please contact [facility] at [phone] with questions or concerns regarding your invoice.   Our billing staff will not be able to assist you with questions regarding bills from these companies.  You will be contacted with the lab results as soon as they are available. The fastest way to get your results is to activate your My Chart account. Instructions are located on the last page of this paperwork. If you have not heard from [place] regarding the results in 2 weeks, please contact this office.    Tremor A tremor is trembling or shaking that you cannot control. Most tremors affect the hands or arms. Tremors can also affect the head, vocal cords, face, and other parts of the body. There are many types of tremors. Common types include:  Essential tremor. These usually occur in people over the age of 56. It may run in families and can happen in otherwise healthy people.  Resting tremor. These occur when the muscles are at rest, such as when your hands are resting in your lap. People with Parkinson disease often have resting tremors.  Postural tremor. These occur when you try to hold a pose, such as keeping your hands outstretched.  Kinetic tremor. These occur during purposeful movement, such as trying to touch a finger to your nose.  Task-specific tremor. These may occur when you perform tasks such as handwriting, speaking, or standing.  Psychogenic tremor. These dramatically lessen or disappear when you are distracted. They can happen in people of all ages. Some types of tremors have no known cause. Tremors can also be a symptom of nervous system problems (neurological disorders) that may occur with aging. Some tremors go away  with treatment while others do not. Follow these instructions at home: Watch your tremor for any changes. The following actions may help to lessen any discomfort you are feeling:  Take medicines only as directed by your health care provider.  Limit alcohol intake to no more than 1 drink per day for nonpregnant women and 2 drinks per day for men. One drink equals 12 oz of beer, 5 oz of wine, or 1 oz of hard liquor.  Do not use any tobacco products, including cigarettes, chewing tobacco, or electronic cigarettes. If you need help quitting, ask your health care provider.  Avoid extreme heat or cold.  Limit the amount of caffeine you consumeas directed by your health care provider.  Try to get 8 hours of sleep each night.  Find ways to manage your stress, such as meditation or yoga.  Keep all follow-up visits as directed by your health care provider. This is important. Contact a health care provider if:  You start having a tremor after starting a new medicine.  You have tremor with other symptoms such as:  Numbness.  Tingling.  Pain.  Weakness.  Your tremor gets worse.  Your tremor interferes with your day-to-day life. This information is not intended to replace advice given to you by your health care provider. Make sure you discuss any questions you have with your health care provider. Document Released: 02/17/2002 Document Revised: 10/31/2015 Document Reviewed: 08/25/2013 Elsevier Interactive Patient Education  2017 [employer].

## 2016-06-22 NOTE — Telephone Encounter (Signed)
Urgent referrals to ENT and Neuro placed after hours. I will not be in referrals tomorrow, so maybe TL can call and try to get scheduled? Neuro will require a call to get the pt is quickly (otherwise they will schd weeks/months out).   ENT: Dr Ezzard Standing or Dr Haroldine Laws Neuro: [facility] ([place]), [facility], or [facility] ([place]) (not local, but most likely to work pt in)

## 2016-06-23 NOTE — Telephone Encounter (Signed)
Sent ENT referral to Dr. Ezzard Standing on 4/13. [facility], [facility], and [facility]. [facility] was able to schedule the soonest on 06/29/16 at 2pm so I went ahead and had that scheduled. Tried calling the pt about appt.

## 2016-06-23 NOTE — Telephone Encounter (Signed)
Pt returned call and is aware of appt with [facility] on 06/29/16

## 2016-06-26 ENCOUNTER — Telehealth: Payer: Self-pay | Admitting: Emergency Medicine

## 2016-06-26 NOTE — Telephone Encounter (Signed)
CT of the head has been approved, CT soft tissue of the neck requires a peer to peer. Case number is [id number]; The ordering provider must call [phone] and select option #3 to engage in a Physician-to-Physician discussion. Unless we choose to not continue with this imaging.

## 2016-06-29 ENCOUNTER — Encounter: Payer: Self-pay | Admitting: Neurology

## 2016-06-29 ENCOUNTER — Ambulatory Visit (INDEPENDENT_AMBULATORY_CARE_PROVIDER_SITE_OTHER): Payer: 59 | Admitting: Neurology

## 2016-06-29 VITALS — Ht 73.0 in | Wt 210.0 lb

## 2016-06-29 DIAGNOSIS — R202 Paresthesia of skin: Secondary | ICD-10-CM

## 2016-06-29 DIAGNOSIS — G3184 Mild cognitive impairment, so stated: Secondary | ICD-10-CM | POA: Diagnosis not present

## 2016-06-29 DIAGNOSIS — R251 Tremor, unspecified: Secondary | ICD-10-CM | POA: Insufficient documentation

## 2016-06-29 MED ORDER — CARBIDOPA-LEVODOPA 25-100 MG PO TABS: 1.0000 | ORAL_TABLET | Freq: Three times a day (TID) | ORAL | 11 refills | Status: DC

## 2016-06-29 MED ORDER — DONEPEZIL HCL 10 MG PO TABS: 10.0000 mg | ORAL_TABLET | Freq: Every day | ORAL | 11 refills | Status: DC

## 2016-06-29 MED ORDER — MEMANTINE HCL 10 MG PO TABS: 10.0000 mg | ORAL_TABLET | Freq: Two times a day (BID) | ORAL | 11 refills | Status: DC

## 2016-06-29 NOTE — Progress Notes (Addendum)
PATIENT: Cory Johnson DOB: 1955/10/16  Chief Complaint  Patient presents with  . Memory Loss    MMSE 28/30 - 9 animals.  He is here with his wife, [name], for his worsening memory.    . Tremors    He is also concerned about the onset of a right arm tremor that started 3-4 ago.    . Drooling    Reports excessive drooling.     HISTORICAL  [name] is a 61 years old right-handed male, accompanied by his wife to follow-up for memory loss, history of REM sleep disorder,  He had a history of cervical fusion, due to advanced degenerative disc disease, presenting with progressive anterocollis, severe limited range of motion of his neck muscles  I saw him previously for  frequent nocturnal jerking movements.  He described that he had frequent transient jerking movements of his body while he was falling into sleep, he was able to quickly drifting back asleep again.   He also also has excessive movement  during the night sleep, kicking, moving his limbs, occasionally talking, cause a lot of complains from his wife.  he often dreamed of playing basketball.   He denied excessive snoring, or stop breathing during the the snoring.  He also complain excessive daytime fatigue, especially during the evening time, he would easily doze off sitting at the  meetings or watching TV.    He had sleep study in October 2013, which indeed demonstrate REM sleep disorder. There is no sleep apnea.  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 CT scan of the brain was normal. He continued to complains of neck pain, REM sleep disorder, frequent awakening during nighttime because of the neck pain, he is exercising regularly  Update June 29 2016, Last clinical visit was with [name] on June 09 2016, he noticed worsening memory loss, he had sudden worsening right hand tremor since March 2018, only mild left hand tremor, he denies significant gait abnormality, he does notice mild difficulties with smell, he still acts out of dreams, he is no longer taking clonazepam, complains of nauseous with clonazepam, he feels coldness in his fingers and toes,   He has lost weight in past few months, is 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  He also complains of bilateral hands paresthesia, coldness of 4 extremity,  I was able to review laboratory evaluation in 2018, normal CMP, creatinine of 0.82, CBC, hemoglobin of 13 point 9, B12 453  REVIEW OF SYSTEMS: Full 14 system review of systems performed and notable only for chills, weight loss, fatigue, loss of vision, wheezing, constipation, urination problems, hearing loss, joint pain, swelling, aching muscles, memory loss, tremor, numbness, not enough sleep, decreased energy, excessive movement during sleep  ALLERGIES: No Known Allergies  HOME MEDICATIONS: Current Outpatient Prescriptions  Medication Sig Dispense Refill  . donepezil (ARICEPT) 5 MG tablet 1  tab daily for 2 weeks then increase to 2 tabs 60 tablet 3  . HYDROcodone-acetaminophen (NORCO) 10-325 MG per tablet Take 1 tablet by mouth 5 (five) times daily. scheduled     No current facility-administered medications for this visit.     PAST MEDICAL HISTORY: Past Medical History:  Diagnosis Date  . Acute pulmonary embolus (HCC) 11/07/2009  . Arthritis    neck - limited range  of motion; back, shoulders  . Chronic back pain   . Difficult intubation   . DVT of lower extremity (deep venous thrombosis) (HCC) 11/07/2009  . Right inguinal hernia 02/2011  . Seizures (HCC)   . Umbilical hernia 02/2011    PAST SURGICAL HISTORY: Past Surgical History:  Procedure Laterality Date  . HERNIA REPAIR    . INGUINAL HERNIA REPAIR  03/17/2011   Procedure: HERNIA REPAIR INGUINAL ADULT;  Surgeon: Ernestene Mention, MD;  Location: [facility];  Service: General;  Laterality: Right;  right inguinal hernia repair with mesh  . KNEE SURGERY  1977   left  . UMBILICAL HERNIA REPAIR  03/17/2011   Procedure: HERNIA REPAIR UMBILICAL ADULT;  Surgeon: Ernestene Mention, MD;  Location: [facility];  Service: General;  Laterality: N/A;  umbilical hernia repair with mesh    FAMILY HISTORY: No family history on file.  SOCIAL HISTORY:  Social History   Social History  . Marital status: Married    Spouse name: [name]  . Number of children: 5  . Years of education: college   Occupational History  . DIRECTOR [employer]   Social History Main Topics  . Smoking status: Never Smoker  . Smokeless tobacco: Never Used  . Alcohol use No  . Drug use: No  . Sexual activity: Yes    Birth control/ protection: Surgical   Other Topics Concern  . Not on file   Social History Narrative   Patient lives at home with his wife [name].   Disabled.   Education college.   Right handed.   Caffeine one cup of coffee daily.     PHYSICAL EXAM   Vitals:   06/29/16 1402  Weight: 210 lb (95.3 kg)  Height:  (1.854 m)    Not recorded      Body mass index is 27.71 kg/m.  PHYSICAL EXAMNIATION:  Gen: NAD, conversant, well nourised, obese, well groomed                     Cardiovascular: Regular rate rhythm, no peripheral edema, warm, nontender. Eyes: Conjunctivae clear without exudates or hemorrhage Neck: Supple, no carotid bruits. Pulmonary:  Clear to auscultation bilaterally   NEUROLOGICAL EXAM:  MENTAL STATUS: animal naming 9 MMSE - Mini Mental State Exam 06/29/2016 06/09/2016  Orientation to time 5 4  Orientation to Place 5 5  Registration 3 3  Attention/ Calculation 5 5  Recall 1 2  Language- name 2 objects 2 2  Language- repeat 1 1  Language- follow 3 step command 3 3  Language- read & follow direction 1 1  Write a sentence 1 1  Copy design 1 1  Total score 28 28     CRANIAL NERVES: CN II: Visual fields are full to confrontation. Fundoscopic exam is normal with sharp discs and no vascular changes. Pupils are round equal and briskly reactive to light. CN III, IV, VI: extraocular movement are normal. No ptosis. CN V: Facial sensation is intact to pinprick in all 3 divisions  bilaterally. Corneal responses are intact.  CN VII: Face is symmetric with normal eye closure and smile. CN VIII: Hearing is normal to rubbing fingers CN IX, X: Palate elevates symmetrically. Phonation is normal. CN XI: Head turning and shoulder shrug are intact CN XII: Tongue is midline with normal movements and no atrophy.  MOTOR: He has mild bilateral hands posturing tremor, right worse than left, Muscle bulk and tone are normal. Muscle strength is normal. Limited range of motion of his neck movements in all directions,  REFLEXES: Reflexes are 2+ and symmetric at the biceps, triceps, knees, and ankles. Plantar responses are flexor.  SENSORY: Intact to light touch, pinprick, positional sensation and vibratory sensation are intact in fingers and toes.  COORDINATION: Rapid alternating movements and fine finger movements are intact. There is no dysmetria on finger-to-nose and heel-knee-shin.    GAIT/STANCE: Posture is normal. Gait is steady with normal steps, base, arm swing, and turning. Heel and toe walking are normal. Tandem gait is normal.  Romberg is absent.   DIAGNOSTIC DATA (LABS, IMAGING, TESTING) - I reviewed patient records,  labs, notes, testing and imaging myself where available.   ASSESSMENT AND PLAN  CHUKWUMA STRAUS is a 61 y.o. male   Long history of REM sleep disorder Fused cervical spine limited range of motion of cervical spine with fixed anterocollis Worsening right hand tremor  Mostly postural component, there was no significant parkinsonian features, Worsening memory loss  Laboratory evaluations, MRI of the brain New-onset paresthesia of 4 extremity,  EMG nerve conduction study to rule out peripheral neuropathy  Levert Feinstein, M.D. Ph.D.  [facility] [address] [place], [place] [address] Ph: [phone] Fax: [phone]  CC: Referring Provider

## 2016-06-29 NOTE — Telephone Encounter (Signed)
Hold off on the neck CT.

## 2016-07-01 LAB — COPPER, SERUM: Copper: 126 ug/dL (ref 72–166)

## 2016-07-01 LAB — C-REACTIVE PROTEIN: CRP: 14.2 mg/L — ABNORMAL HIGH (ref 0.0–4.9)

## 2016-07-01 LAB — CK: Total CK: 60 U/L (ref 24–204)

## 2016-07-01 LAB — THYROID PANEL WITH TSH
Free Thyroxine Index: 2 (ref 1.2–4.9)
T3 Uptake Ratio: 26 % (ref 24–39)
T4, Total: 7.8 ug/dL (ref 4.5–12.0)
TSH: 0.727 u[IU]/mL (ref 0.450–4.500)

## 2016-07-01 LAB — RPR: RPR Ser Ql: NONREACTIVE

## 2016-07-01 LAB — VITAMIN B12: Vitamin B-12: 464 pg/mL (ref 232–1245)

## 2016-07-01 LAB — ANA W/REFLEX: Anti Nuclear Antibody(ANA): NEGATIVE

## 2016-07-01 LAB — SEDIMENTATION RATE: Sed Rate: 3 mm/hr (ref 0–30)

## 2016-07-05 ENCOUNTER — Telehealth: Payer: Self-pay | Admitting: Neurology

## 2016-07-05 NOTE — Telephone Encounter (Signed)
Please call patient, laboratory evaluation showed mildly elevated C reactive protein 14, rest of the laboratory evaluation showed no significant abnormality.

## 2016-07-06 ENCOUNTER — Telehealth: Payer: Self-pay | Admitting: Neurology

## 2016-07-06 NOTE — Telephone Encounter (Signed)
Left message for a return call

## 2016-07-06 NOTE — Telephone Encounter (Signed)
Pt called, said he is wanting to be referred to a facility that has a standing up MRI. Please call

## 2016-07-06 NOTE — Telephone Encounter (Signed)
Spoke to patient - he is aware of lab results. 

## 2016-07-06 NOTE — Telephone Encounter (Signed)
I called the patient back and informed him there is no stand up MRI's I informed him that [facility] having bigger machine's and just request to be put in that.Marland Kitchen He understood and will call GI.

## 2016-07-17 ENCOUNTER — Encounter (HOSPITAL_COMMUNITY): Payer: Self-pay

## 2016-07-17 ENCOUNTER — Emergency Department (HOSPITAL_COMMUNITY): Payer: 59

## 2016-07-17 ENCOUNTER — Emergency Department (HOSPITAL_COMMUNITY)
Admission: EM | Admit: 2016-07-17 | Discharge: 2016-07-17 | Disposition: A | Discharge status: Home / Self Care | Payer: 59 | Attending: Emergency Medicine | Admitting: Emergency Medicine

## 2016-07-17 DIAGNOSIS — Y999 Unspecified external cause status: Secondary | ICD-10-CM | POA: Insufficient documentation

## 2016-07-17 DIAGNOSIS — Y939 Activity, unspecified: Secondary | ICD-10-CM | POA: Insufficient documentation

## 2016-07-17 DIAGNOSIS — Z79899 Other long term (current) drug therapy: Secondary | ICD-10-CM | POA: Diagnosis not present

## 2016-07-17 DIAGNOSIS — S42322A Displaced transverse fracture of shaft of humerus, left arm, initial encounter for closed fracture: Secondary | ICD-10-CM | POA: Diagnosis not present

## 2016-07-17 DIAGNOSIS — S42302A Unspecified fracture of shaft of humerus, left arm, initial encounter for closed fracture: Secondary | ICD-10-CM | POA: Diagnosis not present

## 2016-07-17 DIAGNOSIS — W1830XA Fall on same level, unspecified, initial encounter: Secondary | ICD-10-CM | POA: Diagnosis not present

## 2016-07-17 DIAGNOSIS — S59912A Unspecified injury of left forearm, initial encounter: Secondary | ICD-10-CM | POA: Diagnosis not present

## 2016-07-17 DIAGNOSIS — Y929 Unspecified place or not applicable: Secondary | ICD-10-CM | POA: Diagnosis not present

## 2016-07-17 MED ORDER — OXYCODONE-ACETAMINOPHEN 5-325 MG PO TABS: 1.0000 | ORAL_TABLET | Freq: Four times a day (QID) | ORAL | 0 refills | Status: DC | PRN

## 2016-07-17 MED ORDER — OXYCODONE-ACETAMINOPHEN 5-325 MG PO TABS
1.0000 | ORAL_TABLET | Freq: Once | ORAL | Status: AC
  Administered 2016-07-17: 1 via ORAL
  Filled 2016-07-17: qty 1

## 2016-07-17 NOTE — ED Provider Notes (Signed)
[facility]-EMERGENCY DEPT Provider Note   CSN: [id number] Arrival date & time: 07/17/16  0120     History   Chief Complaint Chief Complaint  Patient presents with  . Fall  . Arm Pain    HPI Cory Johnson is a 61 y.o. male.  HPI   61 year old male with history of dementia presenting for evaluation of left arm injury.  Pt report he has a habit of acting out his dream while sleeping.  Tonight he believe he was moving and acting out his dream while in bed and fell of the bed.  He was found with his L arm ben backward next to the bed by wife who sleeps with him.  He denies hitting head or LOC.  He does report 10/10 severe sharp pain to L upper arm and believes he may have broke his arm. Did report coolness and tingling sensation to his thumb and index fingers, which has improved.  Denies headache, neck pain, cp, sob, abd pain, hip pain or pain to her other extremities.  He is not on any anticoagulant.  No specific treatment tried.  Has hx of tremor, currently being worked up.    Past Medical History:  Diagnosis Date  . Acute pulmonary embolus (HCC) 11/07/2009  . Arthritis    neck - limited range of motion; back, shoulders  . Chronic back pain   . Difficult intubation   . DVT of lower extremity (deep venous thrombosis) (HCC) 11/07/2009  . Right inguinal hernia 02/2011  . Seizures (HCC)   . Umbilical hernia 02/2011    Patient Active Problem List   Diagnosis Date Noted  . Paresthesia 06/29/2016  . Tremor of right hand 06/29/2016  . Mild cognitive impairment 06/29/2016  . Tremors of nervous system 06/22/2016  . Drooling 06/22/2016  . Loss of weight 06/22/2016  . Family history of throat cancer 06/22/2016  . Abnormality of gait 06/09/2016  . Acute bronchitis 03/20/2016  . Memory loss 12/31/2013  . Back pain 08/18/2013  . Arthritis   . Myoclonic jerking   . Right inguinal hernia 03/17/2011  . Umbilical hernia 03/17/2011  . Acute pulmonary embolus (HCC) 11/07/2009  . DVT of  lower extremity (deep venous thrombosis) (HCC) 11/07/2009    Past Surgical History:  Procedure Laterality Date  . HERNIA REPAIR    . INGUINAL HERNIA REPAIR  03/17/2011   Procedure: HERNIA REPAIR INGUINAL ADULT;  Surgeon: Ernestene MentionHaywood M Ingram, MD;  Location: [facility];  Service: General;  Laterality: Right;  right inguinal hernia repair with mesh  . KNEE SURGERY  1977   left  . UMBILICAL HERNIA REPAIR  03/17/2011   Procedure: HERNIA REPAIR UMBILICAL ADULT;  Surgeon: Ernestene MentionHaywood M Ingram, MD;  Location: [facility];  Service: General;  Laterality: N/A;  umbilical hernia repair with mesh       Home Medications    Prior to Admission medications   Medication Sig Start Date End Date Taking? Authorizing Provider  carbidopa-levodopa (SINEMET) 25-100 MG tablet Take 1 tablet by mouth 3 (three) times daily. 06/29/16   Levert FeinsteinYan, Yijun, MD  donepezil (ARICEPT) 10 MG tablet Take 1 tablet (10 mg total) by mouth at bedtime. 1 tab daily for 2 weeks then increase to 2 tabs 06/29/16   Levert FeinsteinYan, Yijun, MD  HYDROcodone-acetaminophen [facility](NORCO) 10-325 MG per tablet Take 1 tablet by mouth 5 (five) times daily. scheduled 03/09/11   [provider]  memantine (NAMENDA) 10 MG tablet Take 1 tablet (10 mg total) by  mouth 2 (two) times daily. 06/29/16   Levert Feinstein, MD    Family History History reviewed. No pertinent family history.  Social History Social History  Substance Use Topics  . Smoking status: Never Smoker  . Smokeless tobacco: Never Used  . Alcohol use No     Allergies   Patient has no known allergies.   Review of Systems Review of Systems  Constitutional: Negative for fever.  Respiratory: Negative for shortness of breath.   Cardiovascular: Negative for chest pain.  Gastrointestinal: Negative for abdominal pain.  Musculoskeletal: Negative for neck pain.  Skin: Negative for rash and wound.  Neurological: Negative for numbness and headaches.  All other systems reviewed and  are negative.    Physical Exam Updated Vital Signs BP (!) 117/94   Pulse 67   Temp 98.2 F (36.8 C)   Resp 16   SpO2 99%   Physical Exam  Constitutional: He appears well-developed and well-nourished. No distress.  HENT:  Head: Atraumatic.  Eyes: Conjunctivae are normal.  Neck: Neck supple.  Musculoskeletal: He exhibits deformity (Left upper arm: Tenderness along humeral region with swelling, crepitus, and obvious closed deformity.  radial pulse 2+, sensation intact throughout).  Neurological: He is alert.  Tremors noted to right upper extremity  Skin: No rash noted.  Psychiatric: He has a normal mood and affect.  Nursing note and vitals reviewed.    ED Treatments / Results  Labs (all labs ordered are listed, but only abnormal results are displayed) Labs Reviewed - No data to display  EKG  EKG Interpretation None       Radiology Dg Humerus Left  Result Date: 07/17/2016 CLINICAL DATA:  Left upper arm deformity after a fall tonight. Pain. EXAM: LEFT HUMERUS - 2+ VIEW COMPARISON:  None. FINDINGS: Transverse fracture of the midshaft left humerus with about 4 mm lateral displacement of the distal fracture fragment. Alignment is mostly intact. No focal bone lesions are identified. There is associated soft tissue swelling. IMPRESSION: Acute transverse fracture of the midshaft left humerus. Electronically Signed   By: Burman Nieves M.D.   On: 07/17/2016 02:20    Procedures Procedures (including critical care time)  Medications Ordered in ED Medications  oxyCODONE-acetaminophen (PERCOCET/ROXICET) 5-325 MG per tablet 1 tablet (1 tablet Oral Given 07/17/16 0239)     Initial Impression / Assessment and Plan / ED Course  I have reviewed the triage vital signs and the nursing notes.  Pertinent labs & imaging results that were available during my care of the patient were reviewed by me and considered in my medical decision making (see chart for details).     BP 117/83    Pulse 68   Temp 98.2 F (36.8 C)   Resp 16   SpO2 99%    Final Clinical Impressions(s) / ED Diagnoses   Final diagnoses:  Closed displaced transverse fracture of shaft of left humerus, initial encounter    New Prescriptions New Prescriptions   OXYCODONE-ACETAMINOPHEN (PERCOCET/ROXICET) 5-325 MG TABLET    Take 1 tablet by mouth every 6 (six) hours as needed for severe pain.   3:12 AM Patient fell out of bed injuring his left upper arm. X-ray of the left humerus demonstrate an acute transverse fracture of the midshaft left humerus. He is neurovascularly intact. This is a closed injury. Will apply a coaptation splint, with sling as treatment. He will need to follow-up with orthopedics for further care. Pain medication given. Care discussed with Dr. Bebe Shaggy.    In order  to decrease risk of narcotic abuse. Pt's record were checked using the [place] Controlled Substance database.    SPLINT APPLICATION Date/Time: 3:30 AM Authorized by: Fayrene Helper Consent: Verbal consent obtained. Risks and benefits: risks, benefits and alternatives were discussed Consent given by: patient Splint applied by: orthopedic technician Location details: L humeral region Splint type: coaptation Supplies used: fiberglass Post-procedure: The splinted body part was neurovascularly unchanged following the procedure. Patient tolerance: Patient tolerated the procedure well with no immediate complications.      Fayrene Helper, PA-C 07/17/16 1610    Zadie Rhine, MD 07/17/16 [phone]

## 2016-07-17 NOTE — ED Notes (Signed)
Ortho tech at bedside 

## 2016-07-17 NOTE — Progress Notes (Signed)
Orthopedic Tech Progress Note Patient Details:  Nicoletta DressKenneth B Mahl [birth year] [id number]  Ortho Devices Type of Ortho Device: Arm sling, Coapt Ortho Device/Splint Location: lue Ortho Device/Splint Interventions: Ordered, Application   Trinna PostMartinez, Valbona Slabach J 07/17/2016, 3:47 AM

## 2016-07-17 NOTE — ED Triage Notes (Signed)
Pt complaining of L arm pain. Pt states rolled out of bed and landed onto L arm. Pt with some swelling to L upper arm. Pt states unable to move arm.

## 2016-07-17 NOTE — ED Notes (Signed)
Pt is in xray, notified xray tech to bring pt to treatment room

## 2016-07-17 NOTE — ED Notes (Signed)
Patient transported to X-ray 

## 2016-07-17 NOTE — Discharge Instructions (Signed)
Please call and follow up with orthopedist for further care of your broken arm.

## 2016-07-18 DIAGNOSIS — S42365A Nondisplaced segmental fracture of shaft of humerus, left arm, initial encounter for closed fracture: Secondary | ICD-10-CM | POA: Diagnosis not present

## 2016-07-20 ENCOUNTER — Encounter (HOSPITAL_COMMUNITY): Payer: Self-pay

## 2016-07-20 ENCOUNTER — Emergency Department (HOSPITAL_COMMUNITY)
Admission: EM | Admit: 2016-07-20 | Discharge: 2016-07-20 | Disposition: A | Discharge status: Home / Self Care | Payer: 59 | Attending: Emergency Medicine | Admitting: Emergency Medicine

## 2016-07-20 DIAGNOSIS — M79602 Pain in left arm: Secondary | ICD-10-CM | POA: Insufficient documentation

## 2016-07-20 DIAGNOSIS — Z79899 Other long term (current) drug therapy: Secondary | ICD-10-CM | POA: Diagnosis not present

## 2016-07-20 NOTE — ED Notes (Signed)
Ortho tech paged to come readjust arm sling.

## 2016-07-20 NOTE — ED Provider Notes (Signed)
I have personally seen and examined the patient. I have reviewed the documentation on PMH/FH/Soc Hx. I have discussed the plan of care with the resident and patient.  I have reviewed and agree with the resident's documentation. Please see associated encounter note.   EKG Interpretation None        Cardama, Amadeo GarnetPedro Eduardo, MD 07/20/16 1542

## 2016-07-20 NOTE — ED Triage Notes (Signed)
Pt presents to the ed with pain and swelling in his left arm.  He broke his arm on Monday and was seen here and splinted and then saw the orthopedic doctor.  He is concerned because his pain is not controlled and it has been swollen ever since. Cms intact, strong radial pulses.  Patient took a norco pta. His pain management doctor said he needs to be admitted.

## 2016-07-20 NOTE — ED Provider Notes (Signed)
[facility] DEPT Provider Note   CSN: [id number] Arrival date & time: 07/20/16  1320     History   Chief Complaint Chief Complaint  Patient presents with  . Extremity Pain    HPI Cory Johnson is a 61 y.o. male.  Patient broke left arm last week and continues to have pain and swelling. Patient taking norco for pain and in a splint and sling for comfort. Patient saw ortho yesterday and will have non-operative management. Patient here with continued pain. Has follow up with ortho next week. Patient not taking any OTC pain meds.    The history is provided by the patient.  Illness  This is a new problem. The current episode started more than 2 days ago. The problem occurs constantly. The problem has not changed since onset.Pertinent negatives include no chest pain, no abdominal pain, no headaches and no shortness of breath. Exacerbated by: moving left arm. The symptoms are relieved by narcotics. The treatment provided mild relief.    Past Medical History:  Diagnosis Date  . Acute pulmonary embolus (HCC) 11/07/2009  . Arthritis    neck - limited range of motion; back, shoulders  . Chronic back pain   . Difficult intubation   . DVT of lower extremity (deep venous thrombosis) (HCC) 11/07/2009  . Right inguinal hernia 02/2011  . Seizures (HCC)   . Umbilical hernia 02/2011    Patient Active Problem List   Diagnosis Date Noted  . Paresthesia 06/29/2016  . Tremor of right hand 06/29/2016  . Mild cognitive impairment 06/29/2016  . Tremors of nervous system 06/22/2016  . Drooling 06/22/2016  . Loss of weight 06/22/2016  . Family history of throat cancer 06/22/2016  . Abnormality of gait 06/09/2016  . Acute bronchitis 03/20/2016  . Memory loss 12/31/2013  . Back pain 08/18/2013  . Arthritis   . Myoclonic jerking   . Right inguinal hernia 03/17/2011  . Umbilical hernia 03/17/2011  . Acute pulmonary embolus (HCC) 11/07/2009  . DVT of lower extremity (deep venous  thrombosis) (HCC) 11/07/2009    Past Surgical History:  Procedure Laterality Date  . HERNIA REPAIR    . INGUINAL HERNIA REPAIR  03/17/2011   Procedure: HERNIA REPAIR INGUINAL ADULT;  Surgeon: Ernestene Mention, MD;  Location: [facility];  Service: General;  Laterality: Right;  right inguinal hernia repair with mesh  . KNEE SURGERY  1977   left  . UMBILICAL HERNIA REPAIR  03/17/2011   Procedure: HERNIA REPAIR UMBILICAL ADULT;  Surgeon: Ernestene Mention, MD;  Location: [facility];  Service: General;  Laterality: N/A;  umbilical hernia repair with mesh       Home Medications    Prior to Admission medications   Medication Sig Start Date End Date Taking? Authorizing Provider  carbidopa-levodopa (SINEMET) 25-100 MG tablet Take 1 tablet by mouth 3 (three) times daily. Patient not taking: Reported on 07/17/2016 06/29/16   Levert Feinstein, MD  donepezil (ARICEPT) 10 MG tablet Take 1 tablet (10 mg total) by mouth at bedtime. 1 tab daily for 2 weeks then increase to 2 tabs Patient not taking: Reported on 07/17/2016 06/29/16   Levert Feinstein, MD  HYDROcodone-acetaminophen [facility]) 10-325 MG per tablet Take 1 tablet by mouth 5 (five) times daily. scheduled 03/09/11   [provider]  memantine (NAMENDA) 10 MG tablet Take 1 tablet (10 mg total) by mouth 2 (two) times daily. Patient not taking: Reported on 07/17/2016 06/29/16   Levert Feinstein, MD  oxyCODONE-acetaminophen (PERCOCET/ROXICET) 5-325 MG tablet Take 1 tablet by mouth every 6 (six) hours as needed for severe pain. 07/17/16   Fayrene Helperran, [place], PA-C    Family History No family history on file.  Social History Social History  Substance Use Topics  . Smoking status: Never Smoker  . Smokeless tobacco: Never Used  . Alcohol use No     Allergies   Patient has no known allergies.   Review of Systems Review of Systems  Constitutional: Negative for chills and fever.  HENT: Negative for ear pain and sore throat.   Eyes:  Negative for pain and visual disturbance.  Respiratory: Negative for cough and shortness of breath.   Cardiovascular: Negative for chest pain and palpitations.  Gastrointestinal: Negative for abdominal pain and vomiting.  Genitourinary: Negative for dysuria and hematuria.  Musculoskeletal: Negative for arthralgias and back pain.  Skin: Negative for color change and rash.  Neurological: Negative for seizures, syncope and headaches.  All other systems reviewed and are negative.    Physical Exam Updated Vital Signs  ED Triage Vitals  Enc Vitals Group     BP 07/20/16 1355 125/75     Pulse Rate 07/20/16 1355 73     Resp 07/20/16 1355 18     Temp 07/20/16 1355 98.4 F (36.9 C)     Temp Source 07/20/16 1355 Oral     SpO2 07/20/16 1355 98 %     Weight 07/20/16 1404 210 lb (95.3 kg)     Height 07/20/16 1404 6\' 1"  (1.854 m)     Head Circumference --      Peak Flow --      Pain Score 07/20/16 1404 10     Pain Loc --      Pain Edu? --      Excl. in GC? --     Physical Exam  Constitutional: He appears well-developed and well-nourished.  HENT:  Head: Normocephalic and atraumatic.  Eyes: Conjunctivae are normal.  Neck: Neck supple.  Cardiovascular: Normal rate, regular rhythm and intact distal pulses.   No murmur heard. Pulmonary/Chest: Effort normal and breath sounds normal. No respiratory distress.  Abdominal: Soft. There is no tenderness.  Musculoskeletal: He exhibits edema (left hand but not in wrist/forearm). He exhibits no tenderness.  Neurological: He is alert.  Skin: Skin is warm and dry.  Psychiatric: He has a normal mood and affect.  Nursing note and vitals reviewed.    ED Treatments / Results  Labs (all labs ordered are listed, but only abnormal results are displayed) Labs Reviewed - No data to display  EKG  EKG Interpretation None       Radiology No results found.  Procedures Procedures (including critical care time)  Medications Ordered in  ED Medications - No data to display   Initial Impression / Assessment and Plan / ED Course  I have reviewed the triage vital signs and the nursing notes.  Pertinent labs & imaging results that were available during my care of the patient were reviewed by me and considered in my medical decision making (see chart for details).     Cory Johnson is a [age] year old male with history of chronic pain who presents to the ED with left arm pain following left arm fracture. Patient's vitals at time of arrival to the ED are unremarkable and patient is without fever. Patient fractured his left humerus several days ago and has had ongoing pain and swelling in the arm. Patient is currently on narcotic pain  medicine prescribed by orthopedics. Patient has splint in place and is using a sling. Patient has swelling in the left hand but minimal swelling in the wrist and forearm. Sling appears to be not working well as patient arm is hanging down and likely the cause of increased swelling in the hand. Patient has good pulses and compartment is soft in the forearm. Doubt DVT or compartment syndrome. Patient likely with normal swelling following fracture. [employer] was called to rearrange and make sling more comfortable. Patient was reevaluated and states that he feels a lot more comfortable with having his arm more elevated. Recommend that the patient take Motrin for pain in addition to his narcotic. Warned of the side effects of both Motrin and narcotics and was discharged from the ED in good condition. Patient has follow-up with orthopedics next week and was told to return to ED if symptoms worsen. Supervised by Dr. Eudelia Bunch.   Final Clinical Impressions(s) / ED Diagnoses   Final diagnoses:  Left arm pain    New Prescriptions Discharge Medication List as of 07/20/2016  3:47 PM       Virgina Norfolk, DO 07/20/16 2130

## 2016-07-20 NOTE — Discharge Instructions (Signed)
Continue to use pain medicine that has been prescribed. Use aleve as well to help with pain and swelling.

## 2016-07-26 DIAGNOSIS — S42365D Nondisplaced segmental fracture of shaft of humerus, left arm, subsequent encounter for fracture with routine healing: Secondary | ICD-10-CM | POA: Diagnosis not present

## 2016-07-26 DIAGNOSIS — S42365A Nondisplaced segmental fracture of shaft of humerus, left arm, initial encounter for closed fracture: Secondary | ICD-10-CM | POA: Diagnosis not present

## 2016-08-01 DIAGNOSIS — S42365D Nondisplaced segmental fracture of shaft of humerus, left arm, subsequent encounter for fracture with routine healing: Secondary | ICD-10-CM | POA: Diagnosis not present

## 2016-08-08 ENCOUNTER — Telehealth: Payer: Self-pay | Admitting: Neurology

## 2016-08-08 NOTE — Telephone Encounter (Signed)
Patient's wife calling back stating our office needs to call insurance company regarding NCV/EMG patient is having on Friday.

## 2016-08-08 NOTE — Telephone Encounter (Signed)
[facility] and [facility] looks like [facility] is just a open MRI I tried calling the office but the phone number that is on their website did not work.

## 2016-08-08 NOTE — Telephone Encounter (Signed)
Patients wife called office stating patient is unable to lay down flat for MRI at GI and requesting a stand up MRI.  Please call

## 2016-08-08 NOTE — Telephone Encounter (Signed)
I already spoke with the patient and informed him that I don't know any places that have a stand up machine.. Please advise.

## 2016-08-08 NOTE — Telephone Encounter (Signed)
Returned call to pt's wife - states he broke his left Humerus bone.  He is in a soft cast and sling.  Dr. [name] reviewed chart and said it was okay to proceed with NCV/EMG.  The patient will keep his appt on 08/11/16.

## 2016-08-08 NOTE — Telephone Encounter (Signed)
Patient is having a NCV/EMG on 08-11-16 but he has a left broken arm. Should he still have this test. Please call wife and advise.

## 2016-08-09 DIAGNOSIS — S42365D Nondisplaced segmental fracture of shaft of humerus, left arm, subsequent encounter for fracture with routine healing: Secondary | ICD-10-CM | POA: Diagnosis not present

## 2016-08-11 ENCOUNTER — Encounter: Payer: 59 | Admitting: Neurology

## 2016-08-11 NOTE — Telephone Encounter (Signed)
Called and left message stating called Insurance No Authorization was needed for NCV/ EMG. Can you please call patient and get him rescheduled for NCV/ EMG. Thanks Annabelle Harmanana

## 2016-08-11 NOTE — Telephone Encounter (Signed)
Pt. NCV/EMG has already been rescheduled for 7/27.

## 2016-08-16 DIAGNOSIS — S42365D Nondisplaced segmental fracture of shaft of humerus, left arm, subsequent encounter for fracture with routine healing: Secondary | ICD-10-CM | POA: Diagnosis not present

## 2016-08-28 ENCOUNTER — Telehealth: Payer: Self-pay | Admitting: Neurology

## 2016-08-28 NOTE — Telephone Encounter (Signed)
Last visit was in April 2018, MRI of the brain was ordered for evaluation of memory loss,  CT scan of brain in Nov 2017 showed no acute abnormality,   We will cancel MRI brain. It is hard to find a machine to accommodate his needs

## 2016-08-28 NOTE — Telephone Encounter (Signed)
Noted, thank you

## 2016-08-28 NOTE — Telephone Encounter (Signed)
I am not sure what to do at this point.Marland Kitchen.  He wants a stand up MRI but there is no place's that have a stand up MRI machine.

## 2016-08-28 NOTE — Addendum Note (Signed)
Addended by: Levert FeinsteinYAN, Kristee Angus on: 08/28/2016 03:09 PM   Modules accepted: Orders

## 2016-08-28 NOTE — Telephone Encounter (Signed)
Patient's wife requesting call back regarding setting up MRI (he is not able to lay down). Best call back is [phone]

## 2016-08-31 DIAGNOSIS — S42365D Nondisplaced segmental fracture of shaft of humerus, left arm, subsequent encounter for fracture with routine healing: Secondary | ICD-10-CM | POA: Diagnosis not present

## 2016-09-19 DIAGNOSIS — M5136 Other intervertebral disc degeneration, lumbar region: Secondary | ICD-10-CM | POA: Diagnosis not present

## 2016-09-19 DIAGNOSIS — G894 Chronic pain syndrome: Secondary | ICD-10-CM | POA: Diagnosis not present

## 2016-09-29 DIAGNOSIS — S42365D Nondisplaced segmental fracture of shaft of humerus, left arm, subsequent encounter for fracture with routine healing: Secondary | ICD-10-CM | POA: Diagnosis not present

## 2016-10-02 ENCOUNTER — Ambulatory Visit: Payer: 59 | Admitting: Neurology

## 2016-10-06 ENCOUNTER — Encounter (INDEPENDENT_AMBULATORY_CARE_PROVIDER_SITE_OTHER): Payer: Self-pay

## 2016-10-06 ENCOUNTER — Ambulatory Visit (INDEPENDENT_AMBULATORY_CARE_PROVIDER_SITE_OTHER): Payer: 59 | Admitting: Neurology

## 2016-10-06 DIAGNOSIS — R202 Paresthesia of skin: Secondary | ICD-10-CM | POA: Diagnosis not present

## 2016-10-06 DIAGNOSIS — R251 Tremor, unspecified: Secondary | ICD-10-CM | POA: Diagnosis not present

## 2016-10-06 DIAGNOSIS — G3184 Mild cognitive impairment, so stated: Secondary | ICD-10-CM | POA: Diagnosis not present

## 2016-10-06 DIAGNOSIS — R4181 Age-related cognitive decline: Secondary | ICD-10-CM | POA: Diagnosis not present

## 2016-10-06 DIAGNOSIS — Z0289 Encounter for other administrative examinations: Secondary | ICD-10-CM

## 2016-10-06 NOTE — Procedures (Signed)
Full Name: Cory Johnson Gender: Male MRN #: [id number] Date of Birth: 1956/02/10    Visit Date: 10/06/2016 12:11 Age: [age] Years 1 Months Old Examining Physician: Cory Johnson Marthe Dant, MD  Referring Physician: [name], MD History: 61 years old male, with history of neck forceful flexion, developed gradual worsening bilateral hands tremor, recent complains of 4 extremity paresthesia, left humeral fracture in May 2018  Summary of the test:  Nerve conduction study: Bilateral sural sensory responses showed severely decreased snap amplitude, bilateral superficial peroneal sensory responses were absent.   Right ulnar and radial sensory responses also showed moderately decreased snap amplitude. Right median sensory responses showed mildly decreased snap amplitude, low normal range . Latency.  Right tibial, left peroneal to EDB motor responses were normal. Left tibial motor responses showed mildly decreased C map amplitude. Right peroneal to EDB motor responses were absent. Right ulnar motor responses showed moderately prolonged distal latency, with mildly decreased conduction velocity. Right median motor responses were within normal limits.  Right ulnar motor responses showed moderately prolonged peak latency, with normal C map amplitude, mildly slow conduction velocity.  Electromyography:  Selective needle examinations were performed at bilateral lower extremity muscles, bilateral lumbar sacral paraspinal muscles, right upper extremity muscles, right cervical paraspinal muscles.  There was increased insertional activity at bilateral distal leg muscles, but there was no significant chronic neuropathic changes.  Conclusion: This is an abnormal study. There is electrodiagnostic evidence of mild length dependent, sensory more than motor axonal polyneuropathy, there is no evidence of bilateral lumbar sacral radiculopathy or right cervical radiculopathy.    ------------------------------- Cory Johnson  Cory Johnson, M.D.  [facility] [address] [place], [place] [address] Tel: [phone] Fax: [phone]        [facility]    Nerve / Sites Rec. Site Latency Ref. Amplitude Ref. Rel Amp Segments Distance Velocity Ref. Area    ms ms mV mV %  cm m/s m/s mVms  R Median - APB     Wrist APB 3.9 ?4.4 8.5 ?4.0 100 Wrist - APB 7   36.0     Upper arm APB 8.8  8.3  98.4 Upper arm - Wrist 26 54 ?49 35.2  R Ulnar - ADM     Wrist ADM 4.1 ?3.3 7.4 ?6.0 100 Wrist - ADM 7   27.6     B.Elbow ADM 8.1  6.5  88.7 B.Elbow - Wrist 18 44 ?49 22.5     A.Elbow ADM 11.3  6.6  101 A.Elbow - B.Elbow 12 38 ?49 23.6         A.Elbow - Wrist      R Peroneal - EDB     Ankle EDB NR ?6.5 NR ?2.0 NR Ankle - EDB 9   NR         Pop fossa - Ankle      L Peroneal - EDB     Ankle EDB 5.8 ?6.5 4.8 ?2.0 100 Ankle - EDB 9   18.6     Fib head EDB 13.9  4.2  86.6 Fib head - Ankle 34 42 ?44 16.8     Pop fossa EDB 16.9  3.9  93.4 Pop fossa - Fib head 12 40 ?44 16.6         Pop fossa - Ankle      R Tibial - AH     Ankle AH 5.5 ?5.8 6.3 ?4.0 100 Ankle - AH 9   14.6     Pop fossa  AH 16.6  5.6  89.6 Pop fossa - Ankle 44 40 ?41 13.1  L Tibial - AH     Ankle AH 5.3 ?5.8 3.4 ?4.0 100 Ankle - AH 9   11.2     Pop fossa AH 16.8  2.9  85.5 Pop fossa - Ankle 44 38 ?41 8.1                      SNC    Nerve / Sites Rec. Site Peak Lat Ref.  Amp Ref. Segments Distance    ms ms V V  cm  R Radial - Anatomical snuff box (Forearm)     Forearm Wrist 2.81 ?2.90 9 ?15 Forearm - Wrist 10  R Sural - Ankle (Calf)     Calf Ankle 4.58 ?4.40 1 ?6 Calf - Ankle 14  L Sural - Ankle (Calf)     Calf Ankle 4.48 ?4.40 1 ?6 Calf - Ankle 14  R Superficial peroneal - Ankle     Lat leg Ankle NR ?4.40 NR ?6 Lat leg - Ankle 14  L Superficial peroneal - Ankle     Lat leg Ankle NR ?4.40 NR ?6 Lat leg - Ankle 14  R Median - Orthodromic (Dig II, Mid palm)     Dig II Wrist 3.33 ?3.40 8 ?10 Dig II - Wrist 13  R Ulnar - Orthodromic, (Dig V, Mid palm)      Dig V Wrist 3.39 ?3.10 2 ?5 Dig V - Wrist 4411                   F  Wave    Nerve F Lat Ref.   ms ms  R Tibial - AH 62.7 ?56.0  L Tibial - AH 61.9 ?56.0  R Ulnar - ADM 36.1 ?32.0           EMG full       EMG Summary Table    Spontaneous MUAP Recruitment  Muscle IA Fib PSW Fasc Other Amp Dur. Poly Pattern  R. Tibialis anterior Increased None None None CRDs Normal Normal Normal Normal  R. Gastrocnemius (Medial head) Normal None None None _______ Normal Normal Normal Normal  R. Vastus lateralis Normal None None None _______ Normal Normal Normal Normal  L. Tibialis anterior Increased None None None CRDs Normal Normal Normal Normal  L. Tibialis posterior Normal None None None _______ Normal Normal Normal Normal  L. Gastrocnemius (Medial head) Normal None None None _______ Normal Normal Normal Normal  L. Vastus lateralis Normal None None None _______ Normal Normal Normal Normal  R. Lumbar paraspinals (low) Normal None None None _______ Normal Normal Normal Normal  R. Lumbar paraspinals (mid) Normal None None None _______ Normal Normal Normal Normal  L. Lumbar paraspinals (low) Normal None None None _______ Normal Normal Normal Normal  L. Lumbar paraspinals (mid) Normal None None None _______ Normal Normal Normal Normal  R. First dorsal interosseous Normal None None None _______ Normal Normal Normal Normal  R. Pronator teres Normal None None None _______ Normal Normal Normal Normal  R. Biceps brachii Normal None None None _______ Normal Normal Normal Normal  R. Deltoid Normal None None None _______ Normal Normal Normal Normal  R. Cervical paraspinals Increased None None None _______ Normal Normal Normal Normal

## 2016-10-06 NOTE — Progress Notes (Signed)
PATIENT: Cory Johnson DOB: April 21, 1955  No chief complaint on file.    HISTORICAL  Cory Johnson is a 61 years old right-handed male, accompanied by his wife to follow-up for memory loss, history of REM sleep disorder,  He had a history of cervical fusion, due to advanced degenerative disc disease, presenting with progressive anterocollis, severe limited range of motion of his neck muscles  I saw him previously for  frequent nocturnal jerking movements.  He described that he had frequent transient jerking movements of his body while he was falling into sleep, he was able to quickly drifting back asleep again.   He also also has excessive movement  during the night sleep, kicking, moving his limbs, occasionally talking, cause a lot of complains from his wife.  he often dreamed of playing basketball.   He denied excessive snoring, or stop breathing during the the snoring.  He also complain excessive daytime fatigue, especially during the evening time, he would easily doze off sitting at the  meetings or watching TV.    He had sleep study in October 2013, which indeed demonstrate REM sleep disorder. There is no sleep apnea.  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 CT scan of the brain was normal. He continued to complains of neck pain, REM sleep disorder,  frequent awakening during nighttime because of the neck pain, he is exercising regularly  Update June 29 2016, Last clinical visit was with Hoyle Sauer on June 09 2016, he noticed worsening memory loss, he had sudden worsening right hand tremor since March 2018, only mild left hand tremor, he denies significant gait abnormality, he does notice mild difficulties with smell, he still acts out of dreams, he is no longer taking clonazepam, complains of nauseous with clonazepam, he feels coldness in his fingers and toes,   He has lost weight in past few months, is 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,  He fell broke his left humeral in May 2018, did not have surgery,  We reviewed laboratory evaluation in April 2018, normal thyroid functional tests, CPK, ESR, B12, RPR, with mild elevated C reactive protein 14.2,  Continue complains of worsening bilateral hands tremor right worse than left, dragging his foot across the floor, chronic constipation, but is on chronic narcotics treatment for his low back pain, neck pain, continue have REM sleep disorder, poor sleep, poor sense of smell, denied dizziness when getting up quickly,  REVIEW OF SYSTEMS: Full 14 system review of systems performed and notable only for  As above  ALLERGIES: No Known Allergies  HOME MEDICATIONS: Current Outpatient Prescriptions  Medication Sig Dispense Refill  . carbidopa-levodopa (SINEMET) 25-100 MG tablet Take 1 tablet by mouth 3 (three) times daily. (Patient not taking: Reported on 07/17/2016) 90 tablet 11  . donepezil (ARICEPT) 10 MG tablet Take 1 tablet (10 mg total) by mouth at  bedtime. 1 tab daily for 2 weeks then increase to 2 tabs (Patient not taking: Reported on 07/17/2016) 30 tablet 11  . HYDROcodone-acetaminophen (NORCO) 10-325 MG per tablet Take 1 tablet by mouth 5 (five) times daily. scheduled    . memantine (NAMENDA) 10 MG tablet Take 1 tablet (10 mg total) by mouth 2 (two) times daily. (Patient not taking: Reported on 07/17/2016) 60 tablet 11  . oxyCODONE-acetaminophen (PERCOCET/ROXICET) 5-325 MG tablet Take 1 tablet by mouth every 6 (six) hours as needed for severe pain. 20 tablet 0   No current facility-administered medications for this visit.     PAST MEDICAL HISTORY: Past Medical History:  Diagnosis Date  . Acute pulmonary embolus ([place]) 11/07/2009  . Arthritis    neck - limited range of motion; back, shoulders  . Chronic back pain   . Difficult intubation   . DVT of lower extremity (deep venous thrombosis) ([place]) 11/07/2009  . Right inguinal hernia 02/2011  . Seizures ([place])   . Umbilical hernia 45/8099    PAST SURGICAL HISTORY: Past Surgical History:  Procedure Laterality Date  . HERNIA REPAIR    . INGUINAL HERNIA REPAIR  03/17/2011   Procedure: HERNIA REPAIR INGUINAL ADULT;  Surgeon: Adin Hector, MD;  Location: [facility];  Service: General;  Laterality: Right;  right inguinal hernia repair with mesh  . [place]   left  . UMBILICAL HERNIA REPAIR  03/17/2011   Procedure: HERNIA REPAIR UMBILICAL ADULT;  Surgeon: Adin Hector, MD;  Location: [facility];  Service: General;  Laterality: N/A;  umbilical hernia repair with mesh    FAMILY HISTORY: No family history on file.  SOCIAL HISTORY:  Social History   Social History  . Marital status: Married    Spouse name: [name]  . Number of children: 5  . Years of education: college   Occupational History  . DIRECTOR [employer]   Social History Main Topics  . Smoking status: Never Smoker  . Smokeless tobacco: Never Used  . Alcohol  use No  . Drug use: No  . Sexual activity: Yes    Birth control/ protection: Surgical   Other Topics Concern  . Not on file   Social History Narrative   Patient lives at home with his wife [name]).   Disabled.   Education college.   Right handed.   Caffeine one cup of coffee daily.     PHYSICAL EXAM   There were no vitals filed for this visit.  Not recorded      There is no height or weight on file to calculate BMI.  PHYSICAL EXAMNIATION:  Gen: NAD, conversant, well nourised, obese, well groomed                     Cardiovascular: Regular rate rhythm, no peripheral edema, warm, nontender. Eyes: Conjunctivae clear without exudates or hemorrhage Neck: Supple, no carotid bruits. Pulmonary: Clear to auscultation bilaterally   NEUROLOGICAL EXAM:  MENTAL STATUS: animal naming 9 MMSE - Mini Mental State Exam 06/29/2016 06/09/2016  Orientation to time 5 4  Orientation to Place 5 5  Registration 3 3  Attention/ Calculation 5  5  Recall 1 2  Language- name 2 objects 2 2  Language- repeat 1 1  Language- follow 3 step command 3 3  Language- read & follow direction 1 1  Write a sentence 1 1  Copy design 1 1  Total score 28 28     CRANIAL NERVES: CN II: Visual fields are full to confrontation. Fundoscopic exam is normal with sharp discs and no vascular changes. Pupils are round equal and briskly reactive to light. CN III, IV, VI: extraocular movement are normal. No ptosis. CN V: Facial sensation is intact to pinprick in all 3 divisions bilaterally. Corneal responses are intact.  CN VII: Face is symmetric with normal eye closure and smile. CN VIII: Hearing is normal to rubbing fingers CN IX, X: Palate elevates symmetrically. Phonation is normal. CN XI: Head turning and shoulder shrug are intact CN XII: Tongue is midline with normal movements and no atrophy.  MOTOR: He has mild bilateral hands posturing tremor, right worse than left, Muscle bulk and tone are normal.  Muscle strength is normal. Limited range of motion of his neck movements in all directions,  REFLEXES: Reflexes are 2+ and symmetric at the biceps, triceps, knees, and ankles. Plantar responses are flexor.  SENSORY: Intact to light touch, pinprick, positional sensation and vibratory sensation are intact in fingers and toes.  COORDINATION: Rapid alternating movements and fine finger movements are intact. There is no dysmetria on finger-to-nose and heel-knee-shin.    GAIT/STANCE: Posture is normal. Gait is steady with normal steps, base, arm swing, and turning. Heel and toe walking are normal. Tandem gait is normal.  Romberg is absent.   DIAGNOSTIC DATA (LABS, IMAGING, TESTING) - I reviewed patient records, labs, notes, testing and imaging myself where available.   ASSESSMENT AND PLAN  DOMONIQUE BROUILLARD is a 61 y.o. male   Long history of REM sleep disorder Fused cervical spine limited range of motion of cervical spine with fixed anterocollis Worsening right hand tremor  Mostly postural component, there was no significant parkinsonian features,  He has a short trial of Sinemet, did not help his symptoms  DATSCAN was ordered. Worsening memory loss  Laboratory evaluations, I personally CT head without contrast in November 2017, generalized atrophy no acute abnormality, he could not tolerate MRI due to his fixed neck flexion, could not be positioning to MRI machine  Marcial Pacas, M.D. Ph.D.  [facility] [address] [place], [place] [address] Ph: [phone] Fax: [phone]  CC: Referring Provider

## 2016-10-09 ENCOUNTER — Telehealth: Payer: Self-pay | Admitting: Neurology

## 2016-10-09 NOTE — Telephone Encounter (Signed)
Patients wife called and requested to check status of MRI. I informed her that it looked like Dr. [name] had ordered a DAT scan and we would be happy to help get that scheduled for him. She stated that she was under the impression we were going to get an MRI for him and no one ever called to inform her that he wasn't doing the MRI. I told her that we were unable to find a location that he could stand at and she said someone had mentioned [facility]. She said that they would proceed in scheduling the DAT scan if that was what Dr. [name] had ordered. I gave her the number to that department and Darreld McleanDana Cox is going to send the orders over.

## 2016-10-09 NOTE — Telephone Encounter (Signed)
Patient's DAT Scan was approved and I have spoke to Patient and his wife that [facility] with [facility] to schedule .

## 2016-10-09 NOTE — Telephone Encounter (Signed)
[id number] - approval #[id number] -10/09/2016 exp . Sept 30 th 2018  [id number] reference number [id number]. Dcox

## 2016-10-16 DIAGNOSIS — M25612 Stiffness of left shoulder, not elsewhere classified: Secondary | ICD-10-CM | POA: Diagnosis not present

## 2016-10-26 ENCOUNTER — Telehealth: Payer: Self-pay | Admitting: Neurology

## 2016-10-26 ENCOUNTER — Encounter (HOSPITAL_COMMUNITY): Payer: 59

## 2016-10-26 ENCOUNTER — Other Ambulatory Visit (HOSPITAL_COMMUNITY): Payer: 59

## 2016-10-26 NOTE — Telephone Encounter (Signed)
-----   Message from April H Pait sent at 10/23/2016  1:13 PM EDT ----- Regarding: datscan Hello, Mr. Cory Johnson cancelled his datscan for this Thursday. I do not know why.

## 2016-10-26 NOTE — Telephone Encounter (Signed)
Noted, he cancelled his DAT scan

## 2016-10-30 DIAGNOSIS — S42365D Nondisplaced segmental fracture of shaft of humerus, left arm, subsequent encounter for fracture with routine healing: Secondary | ICD-10-CM | POA: Diagnosis not present

## 2016-11-02 DIAGNOSIS — M542 Cervicalgia: Secondary | ICD-10-CM | POA: Diagnosis not present

## 2016-11-02 DIAGNOSIS — G894 Chronic pain syndrome: Secondary | ICD-10-CM | POA: Diagnosis not present

## 2016-11-02 DIAGNOSIS — M5136 Other intervertebral disc degeneration, lumbar region: Secondary | ICD-10-CM | POA: Diagnosis not present

## 2016-12-13 ENCOUNTER — Ambulatory Visit (INDEPENDENT_AMBULATORY_CARE_PROVIDER_SITE_OTHER): Payer: 59 | Admitting: Emergency Medicine

## 2016-12-13 ENCOUNTER — Encounter: Payer: Self-pay | Admitting: Emergency Medicine

## 2016-12-13 ENCOUNTER — Ambulatory Visit (INDEPENDENT_AMBULATORY_CARE_PROVIDER_SITE_OTHER): Payer: 59

## 2016-12-13 VITALS — BP 100/62 | HR 62 | Temp 98.3°F | Resp 16 | Ht 75.0 in | Wt 186.6 lb

## 2016-12-13 DIAGNOSIS — M79674 Pain in right toe(s): Secondary | ICD-10-CM | POA: Insufficient documentation

## 2016-12-13 DIAGNOSIS — S90211A Contusion of right great toe with damage to nail, initial encounter: Secondary | ICD-10-CM | POA: Diagnosis not present

## 2016-12-13 DIAGNOSIS — S99921A Unspecified injury of right foot, initial encounter: Secondary | ICD-10-CM | POA: Diagnosis not present

## 2016-12-13 MED ORDER — HYDROCODONE-ACETAMINOPHEN 5-325 MG PO TABS: 1.0000 | ORAL_TABLET | Freq: Four times a day (QID) | ORAL | 0 refills | Status: DC | PRN

## 2016-12-13 NOTE — Progress Notes (Signed)
Cory Johnson 61 y.o.   Chief Complaint  Patient presents with  . toe swelling    RIGHT Great toe  x 4-5 days    HISTORY OF PRESENT ILLNESS: This is a 61 y.o. male complaining of pain, swelling, and nail black discoloration of right great toe x 4-5 days; cannot recall injury.  HPI   Prior to Admission medications   Medication Sig Start Date End Date Taking? Authorizing Provider  carbidopa-levodopa (SINEMET) 25-100 MG tablet Take 1 tablet by mouth 3 (three) times daily. Patient not taking: Reported on 07/17/2016 06/29/16   Levert Feinstein, MD  donepezil (ARICEPT) 10 MG tablet Take 1 tablet (10 mg total) by mouth at bedtime. 1 tab daily for 2 weeks then increase to 2 tabs Patient not taking: Reported on 07/17/2016 06/29/16   Levert Feinstein, MD  HYDROcodone-acetaminophen (NORCO) 5-325 MG tablet Take 1 tablet by mouth every 6 (six) hours as needed for moderate pain. 12/13/16   Georgina Quint, MD  memantine (NAMENDA) 10 MG tablet Take 1 tablet (10 mg total) by mouth 2 (two) times daily. Patient not taking: Reported on 07/17/2016 06/29/16   Levert Feinstein, MD  oxyCODONE-acetaminophen (PERCOCET/ROXICET) 5-325 MG tablet Take 1 tablet by mouth every 6 (six) hours as needed for severe pain. Patient not taking: Reported on 12/13/2016 07/17/16   Fayrene Helper, PA-C    No Known Allergies  Patient Active Problem List   Diagnosis Date Noted  . Paresthesia 06/29/2016  . Tremor of right hand 06/29/2016  . Mild cognitive impairment 06/29/2016  . Tremors of nervous system 06/22/2016  . Drooling 06/22/2016  . Loss of weight 06/22/2016  . Family history of throat cancer 06/22/2016  . Abnormality of gait 06/09/2016  . Acute bronchitis 03/20/2016  . Memory loss 12/31/2013  . Back pain 08/18/2013  . Arthritis   . Myoclonic jerking   . Right inguinal hernia 03/17/2011  . Umbilical hernia 03/17/2011  . Acute pulmonary embolus ([facility]) 11/07/2009  . DVT of lower extremity (deep venous thrombosis) ([facility]) 11/07/2009     Past Medical History:  Diagnosis Date  . Acute pulmonary embolus ([facility]) 11/07/2009  . Arthritis    neck - limited range of motion; back, shoulders  . Chronic back pain   . Difficult intubation   . DVT of lower extremity (deep venous thrombosis) ([facility]) 11/07/2009  . Right inguinal hernia 02/2011  . Seizures ([facility])   . Umbilical hernia 02/2011    Past Surgical History:  Procedure Laterality Date  . HERNIA REPAIR    . INGUINAL HERNIA REPAIR  03/17/2011   Procedure: HERNIA REPAIR INGUINAL ADULT;  Surgeon: Ernestene Mention, MD;  Location: [facility];  Service: General;  Laterality: Right;  right inguinal hernia repair with mesh  . KNEE SURGERY  1977   left  . UMBILICAL HERNIA REPAIR  03/17/2011   Procedure: HERNIA REPAIR UMBILICAL ADULT;  Surgeon: Ernestene Mention, MD;  Location: [facility];  Service: General;  Laterality: N/A;  umbilical hernia repair with mesh    Social History   Social History  . Marital status: Married    Spouse name: Cory Johnson  . Number of children: 5  . Years of education: college   Occupational History  . DIRECTOR [employer]   Social History Main Topics  . Smoking status: Never Smoker  . Smokeless tobacco: Never Used  . Alcohol use No  . Drug use: No  . Sexual activity: Yes    Birth control/  protection: Surgical   Other Topics Concern  . Not on file   Social History Narrative   Patient lives at home with his wife Cory Johnson).   Disabled.   Education college.   Right handed.   Caffeine one cup of coffee daily.    No family history on file.   Review of Systems  Constitutional: Negative.  Negative for chills and fever.  Musculoskeletal: Positive for joint pain (toe pain).  Skin: Negative for rash.  All other systems reviewed and are negative.  Vitals:   12/13/16 1720  BP: 100/62  Pulse: 62  Resp: 16  Temp: 98.3 F (36.8 C)  SpO2: 97%     Physical Exam  Constitutional: He is oriented to  person, place, and time. He appears well-developed and well-nourished.  HENT:  Head: Normocephalic and atraumatic.  Neck: Decreased range of motion (chronic) present.  Cardiovascular: Normal rate.   Pulmonary/Chest: Effort normal.  Musculoskeletal:  Right big toe: +subungual hematoma with nail elevation; minimal swelling, no erythema or bruising, tender to touch; NVI  Neurological: He is alert and oriented to person, place, and time.  Chronic right arm tremor.  Skin: Skin is warm and dry.  Psychiatric: He has a normal mood and affect. His behavior is normal.  Vitals reviewed.  Xray reviewed by me: ?Fx  ASSESSMENT & PLAN: [name] was seen today for toe swelling.  Diagnoses and all orders for this visit:  Pain of toe of right foot  Subungual hematoma of great toe of right foot, initial encounter Comments: suspected toe injury Orders: -     DG Toe Great Right; Future -     Ambulatory referral to Podiatry  Other orders -     HYDROcodone-acetaminophen (NORCO) 5-325 MG tablet; Take 1 tablet by mouth every 6 (six) hours as needed for moderate pain.    Patient Instructions       IF you received an x-ray today, you will receive an invoice from [facility]. Please contact [facility] at [phone] with questions or concerns regarding your invoice.   IF you received labwork today, you will receive an invoice from [facility]. Please contact [facility] at [phone] with questions or concerns regarding your invoice.   Our billing staff will not be able to assist you with questions regarding bills from these companies.  You will be contacted with the lab results as soon as they are available. The fastest way to get your results is to activate your My Chart account. Instructions are located on the last page of this paperwork. If you have not heard from [place] regarding the results in 2 weeks, please contact this office.     Subungual Hematoma A subungual hematoma  means there is blood that gathers under a fingernail or toenail. A blue or dark blue color is seen under the nail. You may have pain or throbbing in the finger or toe. Follow these instructions at home:  If directed, put ice on the area. ? Put ice in a plastic bag. ? Place a towel between your skin and the bag. ? Leave the ice on for 20 minutes, 2-3 times a day.  Raise (elevate) the injured finger or toe above the level of your heart while you are sitting or lying down. Doing this will help with pain and swelling.  If part of your nail falls off, gently trim the rest of the nail.  Follow instructions from your doctor about how to take care of your injury. Make sure you: ?  Wash your hands with soap and water before you change your bandage (dressing). If you cannot use soap and water, use hand sanitizer. ? Change your bandage as told by your doctor. ? Leave stitches (sutures) in place. You may have these if your doctor fixed a cut under the nail. The stitches may need to stay in place for 2 weeks or longer.  Take over-the-counter and prescription medicines only as told by your doctor.  Keep all follow-up visits as told by your doctor. This is important. Contact a doctor if:  Your medicine does not help with your pain.  There is redness, swelling, or pain around your nail. Get help right away if:  You have fluid, blood, or pus coming from your nail.  You have a fever. This information is not intended to replace advice given to you by your health care provider. Make sure you discuss any questions you have with your health care provider. Document Released: 05/22/2011 Document Revised: 10/27/2015 Document Reviewed: 07/15/2014 Elsevier Interactive Patient Education  2018 Elsevier Inc.     Edwina Barth, MD [facility]

## 2016-12-13 NOTE — Patient Instructions (Addendum)
     IF you received an x-ray today, you will receive an invoice from [facility]. Please contact [facility] at [phone] with questions or concerns regarding your invoice.   IF you received labwork today, you will receive an invoice from [facility]. Please contact [facility] at [phone] with questions or concerns regarding your invoice.   Our billing staff will not be able to assist you with questions regarding bills from these companies.  You will be contacted with the lab results as soon as they are available. The fastest way to get your results is to activate your My Chart account. Instructions are located on the last page of this paperwork. If you have not heard from us regarding the results in 2 weeks, please contact this office.      Subungual Hematoma A subungual hematoma means there is blood that gathers under a fingernail or toenail. A blue or dark blue color is seen under the nail. You may have pain or throbbing in the finger or toe. Follow these instructions at home:  If directed, put ice on the area. ? Put ice in a plastic bag. ? Place a towel between your skin and the bag. ? Leave the ice on for 20 minutes, 2-3 times a day.  Raise (elevate) the injured finger or toe above the level of your heart while you are sitting or lying down. Doing this will help with pain and swelling.  If part of your nail falls off, gently trim the rest of the nail.  Follow instructions from your doctor about how to take care of your injury. Make sure you: ? Wash your hands with soap and water before you change your bandage (dressing). If you cannot use soap and water, use hand sanitizer. ? Change your bandage as told by your doctor. ? Leave stitches (sutures) in place. You may have these if your doctor fixed a cut under the nail. The stitches may need to stay in place for 2 weeks or longer.  Take over-the-counter and prescription medicines only as told by your  doctor.  Keep all follow-up visits as told by your doctor. This is important. Contact a doctor if:  Your medicine does not help with your pain.  There is redness, swelling, or pain around your nail. Get help right away if:  You have fluid, blood, or pus coming from your nail.  You have a fever. This information is not intended to replace advice given to you by your health care provider. Make sure you discuss any questions you have with your health care provider. Document Released: 05/22/2011 Document Revised: 10/27/2015 Document Reviewed: 07/15/2014 Elsevier Interactive Patient Education  2018 Elsevier Inc.  

## 2017-01-10 ENCOUNTER — Ambulatory Visit (INDEPENDENT_AMBULATORY_CARE_PROVIDER_SITE_OTHER): Payer: 59 | Admitting: Podiatry

## 2017-01-10 ENCOUNTER — Encounter: Payer: Self-pay | Admitting: Podiatry

## 2017-01-10 VITALS — BP 148/84 | HR 61 | Ht 75.0 in | Wt 220.0 lb

## 2017-01-10 DIAGNOSIS — G609 Hereditary and idiopathic neuropathy, unspecified: Secondary | ICD-10-CM

## 2017-01-10 DIAGNOSIS — B351 Tinea unguium: Secondary | ICD-10-CM | POA: Diagnosis not present

## 2017-01-10 DIAGNOSIS — S90211A Contusion of right great toe with damage to nail, initial encounter: Secondary | ICD-10-CM | POA: Diagnosis not present

## 2017-01-10 NOTE — Progress Notes (Signed)
SUBJECTIVE: 61 y.o. year old male presents accompanied by his wife complaining of painful right great toe with blood on it. Also burning stinging and numbness on bottom of foot bilateral 6-7 months. Stated that at night sharp pain goes through the feet.   Patient is referred by Dr. Alvy BimlerSagardia.   Having tremor on both hands and under medical investigation. History Blood clot in leg right 2 years ago. Left arm fractured in May 2018 and healing well.   Review of Systems  Constitutional: Negative.   HENT: Negative.   Eyes: Negative.   Respiratory: Negative.   Cardiovascular: Negative.   Gastrointestinal: Negative.   Genitourinary: Negative.   Musculoskeletal: Positive for back pain and neck pain.  Neurological: Positive for tingling, tremors and sensory change. Negative for dizziness and headaches.   OBJECTIVE: DERMATOLOGIC EXAMINATION: Thick discolored nail with dry blood around nail border right great toe. No edema, erythema or drainage noted. Fungal debris under nail plate right great toe.  VASCULAR EXAMINATION OF LOWER LIMBS: All pedal pulses are palpable with normal pulsation.  Capillary Filling times within 3 seconds in all digits.  No edema or erythema noted. Temperature gradient from tibial crest to dorsum of foot is within normal bilateral.  NEUROLOGIC EXAMINATION OF THE LOWER LIMBS: Achilles DTR is present and within normal. Monofilament (Semmes-Weinstein 10-gm) sensory testing positive except for great toe area bilateral. Vibratory sensations(128Hz  turning fork) intact at medial and lateral forefoot bilateral.  Sharp and Dull discriminatory sensations at the plantar ball of hallux is intact bilateral.   MUSCULOSKELETAL EXAMINATION: Positive for hyperextended IPJ right great toe.  ASSESSMENT: Hallux limitus with nail injury right hallux. Onychomycosis right great toe. Injured nail right great toe. Peripheral neuropathy.  PLAN: Reviewed clinical findings and  available treatment options. Affected right great toe nail debrided and removed all affected part. Remained of nails also debrided. Return in 3 month for routine foot care.

## 2017-01-10 NOTE — Patient Instructions (Signed)
Seen for traumatized right great toe nail. Noted of fungal infected hypertrophic nail. All nails debrided. Return in 3 months or sooner if needed.

## 2017-01-23 DIAGNOSIS — G894 Chronic pain syndrome: Secondary | ICD-10-CM | POA: Diagnosis not present

## 2017-01-23 DIAGNOSIS — M542 Cervicalgia: Secondary | ICD-10-CM | POA: Diagnosis not present

## 2017-01-23 DIAGNOSIS — M5136 Other intervertebral disc degeneration, lumbar region: Secondary | ICD-10-CM | POA: Diagnosis not present

## 2017-02-06 DIAGNOSIS — M791 Myalgia, unspecified site: Secondary | ICD-10-CM | POA: Diagnosis not present

## 2017-03-08 DIAGNOSIS — M791 Myalgia, unspecified site: Secondary | ICD-10-CM | POA: Diagnosis not present

## 2017-03-13 DIAGNOSIS — R251 Tremor, unspecified: Secondary | ICD-10-CM

## 2017-03-13 HISTORY — DX: Tremor, unspecified: R25.1

## 2017-03-19 DIAGNOSIS — S42302A Unspecified fracture of shaft of humerus, left arm, initial encounter for closed fracture: Secondary | ICD-10-CM | POA: Insufficient documentation

## 2017-03-19 DIAGNOSIS — S42335G Nondisplaced oblique fracture of shaft of humerus, left arm, subsequent encounter for fracture with delayed healing: Secondary | ICD-10-CM | POA: Diagnosis not present

## 2017-03-26 ENCOUNTER — Other Ambulatory Visit: Payer: Self-pay

## 2017-03-26 ENCOUNTER — Emergency Department (HOSPITAL_COMMUNITY): Payer: 59

## 2017-03-26 ENCOUNTER — Observation Stay (HOSPITAL_COMMUNITY)
Admission: EM | Admit: 2017-03-26 | Discharge: 2017-03-28 | Disposition: A | Discharge status: Home / Self Care | Payer: 59 | Attending: Internal Medicine | Admitting: Internal Medicine

## 2017-03-26 ENCOUNTER — Encounter (HOSPITAL_COMMUNITY): Payer: Self-pay

## 2017-03-26 DIAGNOSIS — E440 Moderate protein-calorie malnutrition: Secondary | ICD-10-CM

## 2017-03-26 DIAGNOSIS — R55 Syncope and collapse: Principal | ICD-10-CM

## 2017-03-26 DIAGNOSIS — Z86718 Personal history of other venous thrombosis and embolism: Secondary | ICD-10-CM | POA: Diagnosis not present

## 2017-03-26 DIAGNOSIS — R42 Dizziness and giddiness: Secondary | ICD-10-CM | POA: Insufficient documentation

## 2017-03-26 DIAGNOSIS — R251 Tremor, unspecified: Secondary | ICD-10-CM | POA: Diagnosis present

## 2017-03-26 DIAGNOSIS — Z86711 Personal history of pulmonary embolism: Secondary | ICD-10-CM | POA: Diagnosis not present

## 2017-03-26 DIAGNOSIS — R5383 Other fatigue: Secondary | ICD-10-CM | POA: Insufficient documentation

## 2017-03-26 HISTORY — DX: Syncope and collapse: R55

## 2017-03-26 HISTORY — DX: Tremor, unspecified: R25.1

## 2017-03-26 LAB — CBC
HCT: 43.6 % (ref 39.0–52.0)
Hemoglobin: 14.1 g/dL (ref 13.0–17.0)
MCH: 29 pg (ref 26.0–34.0)
MCHC: 32.3 g/dL (ref 30.0–36.0)
MCV: 89.7 fL (ref 78.0–100.0)
Platelets: 180 10*3/uL (ref 150–400)
RBC: 4.86 MIL/uL (ref 4.22–5.81)
RDW: 14 % (ref 11.5–15.5)
WBC: 5.5 10*3/uL (ref 4.0–10.5)

## 2017-03-26 LAB — I-STAT TROPONIN, ED: Troponin i, poc: 0 ng/mL (ref 0.00–0.08)

## 2017-03-26 LAB — URINALYSIS, ROUTINE W REFLEX MICROSCOPIC
Bilirubin Urine: NEGATIVE
Glucose, UA: NEGATIVE mg/dL
Hgb urine dipstick: NEGATIVE
Ketones, ur: 5 mg/dL — AB
Nitrite: NEGATIVE
Protein, ur: NEGATIVE mg/dL
Specific Gravity, Urine: 1.031 — ABNORMAL HIGH (ref 1.005–1.030)
pH: 5 (ref 5.0–8.0)

## 2017-03-26 LAB — BASIC METABOLIC PANEL
Anion gap: 9 (ref 5–15)
BUN: 10 mg/dL (ref 6–20)
CO2: 26 mmol/L (ref 22–32)
Calcium: 8.8 mg/dL — ABNORMAL LOW (ref 8.9–10.3)
Chloride: 101 mmol/L (ref 101–111)
Creatinine, Ser: 0.96 mg/dL (ref 0.61–1.24)
GFR calc Af Amer: >60 mL/min (ref >60)
GFR calc non Af Amer: >60 mL/min (ref >60)
Glucose, Bld: 87 mg/dL (ref 65–99)
Potassium: 3.6 mmol/L (ref 3.5–5.1)
Sodium: 136 mmol/L (ref 135–145)

## 2017-03-26 LAB — CBG MONITORING, ED: Glucose-Capillary: 73 mg/dL (ref 65–99)

## 2017-03-26 MED ORDER — SODIUM CHLORIDE 0.9 % IV BOLUS (SEPSIS)
1000.0000 mL | Freq: Once | INTRAVENOUS | Status: AC
  Administered 2017-03-26: 1000 mL via INTRAVENOUS

## 2017-03-26 NOTE — ED Notes (Signed)
Patient transported to X-ray 

## 2017-03-26 NOTE — ED Triage Notes (Signed)
Per Pt, Pt is coming from home with complaints of a syncopal episode today. Pt reports this happened about a year ago. Reports some generalized fatigue that started when he woke up this morning. Hx of tremors. Denies CP, but reports some SOB.

## 2017-03-26 NOTE — ED Provider Notes (Signed)
Emergency Department Provider Note   I have reviewed the triage vital signs and the nursing notes.   HISTORY  Chief Complaint Loss of Consciousness   HPI Cory Johnson is a 62 y.o. male with PMH of PE completing 6 moths Coumadin, DVT, and tremor presents to the emergency department for evaluation of syncopal episode today.  Patient had some generalized fatigue this morning but no other acute symptoms.  Patient states that he feels a jerking sensation that seems localized in his chest.  He denies chest pain.  He is not feeling short of breath.  He feels very lightheaded and then lost consciousness.  He was able to call for his wife who came into the room and found him lying on the floor.  No obvious seizure activity.  When he got up he seemed slightly confused but that resolved.  No apparent head trauma.  Denies headache or neck discomfort.  No pain in the arms or legs.  Denies any chest pain or shortness of breath currently.  No heart palpitations.  Patient completed a course of Coumadin for DVT and PE states does not feel similar to those episodes.    Past Medical History:  Diagnosis Date  . Acute pulmonary embolus (HCC) 11/07/2009  . Arthritis    neck - limited range of motion; back, shoulders  . Chronic back pain   . Difficult intubation   . DVT of lower extremity (deep venous thrombosis) (HCC) 11/07/2009  . Right inguinal hernia 02/2011  . Seizures (HCC)   . Umbilical hernia 02/2011    Patient Active Problem List   Diagnosis Date Noted  . Syncope 03/26/2017  . Subungual hematoma of great toe of right foot 12/13/2016  . Pain of toe of right foot 12/13/2016  . Paresthesia 06/29/2016  . Tremor of right hand 06/29/2016  . Mild cognitive impairment 06/29/2016  . Tremors of nervous system 06/22/2016  . Drooling 06/22/2016  . Loss of weight 06/22/2016  . Family history of throat cancer 06/22/2016  . Abnormality of gait 06/09/2016  . Acute bronchitis 03/20/2016  . Memory  loss 12/31/2013  . Back pain 08/18/2013  . Arthritis   . Myoclonic jerking   . Right inguinal hernia 03/17/2011  . Umbilical hernia 03/17/2011  . Acute pulmonary embolus (HCC) 11/07/2009  . DVT of lower extremity (deep venous thrombosis) (HCC) 11/07/2009    Past Surgical History:  Procedure Laterality Date  . HERNIA REPAIR    . INGUINAL HERNIA REPAIR  03/17/2011   Procedure: HERNIA REPAIR INGUINAL ADULT;  Surgeon: Ernestene Mention, MD;  Location: [facility];  Service: General;  Laterality: Right;  right inguinal hernia repair with mesh  . KNEE SURGERY  1977   left  . UMBILICAL HERNIA REPAIR  03/17/2011   Procedure: HERNIA REPAIR UMBILICAL ADULT;  Surgeon: Ernestene Mention, MD;  Location: [facility];  Service: General;  Laterality: N/A;  umbilical hernia repair with mesh    Current Outpatient Rx  . Order #: [id number] Class: Historical Med  . Order #: [id number] Class: Normal  . Order #: [id number] Class: Normal  . Order #: [id number] Class: Print  . Order #: [id number] Class: Normal  . Order #: [id number] Class: Print    Allergies Patient has no known allergies.  No family history on file.  Social History Social History   Tobacco Use  . Smoking status: Never Smoker  . Smokeless tobacco: Never Used  Substance Use Topics  . Alcohol use: No  . Drug use:  No    Review of Systems  Constitutional: No fever/chills Eyes: No visual changes. ENT: No sore throat. Cardiovascular: Denies chest pain. Positive syncope.  Respiratory: Denies shortness of breath. Gastrointestinal: No abdominal pain.  No nausea, no vomiting.  No diarrhea.  No constipation. Genitourinary: Negative for dysuria. Musculoskeletal: Negative for back pain. Skin: Negative for rash. Neurological: Negative for headaches, focal weakness or numbness. Positive baseline tremor.   10-point ROS otherwise negative.  ____________________________________________   PHYSICAL EXAM:  VITAL  SIGNS: ED Triage Vitals  Enc Vitals Group     BP 03/26/17 1728 (!) 102/53     Pulse Rate 03/26/17 1728 72     Resp 03/26/17 1728 20     Temp 03/26/17 1728 98.6 F (37 C)     Temp Source 03/26/17 1728 Oral     SpO2 03/26/17 1728 100 %     Weight 03/26/17 1725 193 lb (87.5 kg)     Height 03/26/17 1725 6' (1.829 m)     Pain Score 03/26/17 1725 0   Constitutional: Alert and oriented. Well appearing and in no acute distress. Eyes: Conjunctivae are normal.  Head: Atraumatic. Nose: No congestion/rhinnorhea. Mouth/Throat: Mucous membranes are moist.  Neck: No stridor.  Cardiovascular: Normal rate, regular rhythm. Good peripheral circulation. Grossly normal heart sounds.   Respiratory: Normal respiratory effort.  No retractions. Lungs CTAB. Gastrointestinal: Soft and nontender. No distention.  Musculoskeletal: No lower extremity tenderness nor edema. No gross deformities of extremities. Neurologic:  Normal speech and language. No gross focal neurologic deficits are appreciated. Course tremor of the RUE.  Skin:  Skin is warm, dry and intact. No rash noted.   ____________________________________________   LABS (all labs ordered are listed, but only abnormal results are displayed)  Labs Reviewed  BASIC METABOLIC PANEL - Abnormal; Notable for the following components:      Result Value   Calcium 8.8 (*)    All other components within normal limits  URINALYSIS, ROUTINE W REFLEX MICROSCOPIC - Abnormal; Notable for the following components:   Color, Urine AMBER (*)    APPearance HAZY (*)    Specific Gravity, Urine 1.031 (*)    Ketones, ur 5 (*)    Leukocytes, UA MODERATE (*)    Bacteria, UA RARE (*)    Squamous Epithelial / LPF 0-5 (*)    All other components within normal limits  CBC  CBG MONITORING, ED  I-STAT TROPONIN, ED   ____________________________________________  EKG   EKG Interpretation  Date/Time:  Monday March 26 2017 22:43:12 EST Ventricular Rate:  59 PR  Interval:  174 QRS Duration: 93 QT Interval:  427 QTC Calculation: 423 R Axis:   39 Text Interpretation:  Sinus rhythm Ventricular premature complex Nonspecific T abnormalities, inferior leads When compared with ECG of EARLIER SAME DATE No significant change was found Confirmed by Dione BoozeGlick, David ([id number]) on 03/26/2017 11:00:08 PM       ____________________________________________  RADIOLOGY  Dg Chest 2 View  Result Date: 03/26/2017 CLINICAL DATA:  Syncope and dizziness EXAM: CHEST  2 VIEW COMPARISON:  08/20/2012 FINDINGS: The heart size and mediastinal contours are within normal limits. Both lungs are clear. The visualized skeletal structures are unremarkable. IMPRESSION: No active cardiopulmonary disease. Electronically Signed   By: Signa Kellaylor  Stroud M.D.   On: 03/26/2017 21:33    ____________________________________________   PROCEDURES  Procedure(s) performed:   Procedures  None ____________________________________________   INITIAL IMPRESSION / ASSESSMENT AND PLAN / ED COURSE  Pertinent labs & imaging results that  were available during my care of the patient were reviewed by me and considered in my medical decision making (see chart for details).  Patient presents to the emergency department for evaluation of syncope.  Feels lightheaded prior to passing out but denies chest pain, palpitations, shortness of breath.  He has baseline right upper extremity tremor.  Feel that the interference seen on EKG is not flutter but rather the patient's baseline tremor.  He does have frequent PVCs.  Electrolytes are largely unremarkable.  Troponin pending.  No symptoms similar to his prior PE/DVT symptoms. No focal neuro deficits. Considering admission for syncope.   Labs and imaging reviewed.   Discussed patient's case with Hospitalist, Dr. Toniann Fail to request admission. Patient and family (if present) updated with plan. Care transferred to Hospitalist service.  I reviewed all nursing  notes, vitals, pertinent old records, EKGs, labs, imaging (as available).  ____________________________________________  FINAL CLINICAL IMPRESSION(S) / ED DIAGNOSES  Final diagnoses:  Syncope and collapse    MEDICATIONS GIVEN DURING THIS VISIT:  Medications  sodium chloride 0.9 % bolus 1,000 mL (1,000 mLs Intravenous New Bag/Given 03/26/17 2250)    Note:  This document was prepared using Dragon voice recognition software and may include unintentional dictation errors.  Alona Bene, MD Emergency Medicine   Fawnda Vitullo, Arlyss Repress, MD 03/27/17 Jorje Guild

## 2017-03-27 ENCOUNTER — Other Ambulatory Visit: Payer: Self-pay

## 2017-03-27 ENCOUNTER — Observation Stay (HOSPITAL_COMMUNITY): Payer: 59

## 2017-03-27 ENCOUNTER — Observation Stay (HOSPITAL_BASED_OUTPATIENT_CLINIC_OR_DEPARTMENT_OTHER): Payer: 59

## 2017-03-27 ENCOUNTER — Encounter (HOSPITAL_COMMUNITY): Payer: Self-pay | Admitting: Internal Medicine

## 2017-03-27 DIAGNOSIS — R609 Edema, unspecified: Secondary | ICD-10-CM | POA: Diagnosis not present

## 2017-03-27 DIAGNOSIS — R55 Syncope and collapse: Secondary | ICD-10-CM | POA: Diagnosis not present

## 2017-03-27 LAB — CBC
HCT: 41.2 % (ref 39.0–52.0)
Hemoglobin: 13.6 g/dL (ref 13.0–17.0)
MCH: 29.4 pg (ref 26.0–34.0)
MCHC: 33 g/dL (ref 30.0–36.0)
MCV: 89.2 fL (ref 78.0–100.0)
Platelets: 161 10*3/uL (ref 150–400)
RBC: 4.62 MIL/uL (ref 4.22–5.81)
RDW: 14.1 % (ref 11.5–15.5)
WBC: 5.3 10*3/uL (ref 4.0–10.5)

## 2017-03-27 LAB — COMPREHENSIVE METABOLIC PANEL
ALT: 8 U/L — ABNORMAL LOW (ref 17–63)
AST: 13 U/L — ABNORMAL LOW (ref 15–41)
Albumin: 2.7 g/dL — ABNORMAL LOW (ref 3.5–5.0)
Alkaline Phosphatase: 68 U/L (ref 38–126)
Anion gap: 8 (ref 5–15)
BUN: 10 mg/dL (ref 6–20)
CO2: 24 mmol/L (ref 22–32)
Calcium: 8.1 mg/dL — ABNORMAL LOW (ref 8.9–10.3)
Chloride: 106 mmol/L (ref 101–111)
Creatinine, Ser: 0.72 mg/dL (ref 0.61–1.24)
GFR calc Af Amer: >60 mL/min (ref >60)
GFR calc non Af Amer: >60 mL/min (ref >60)
Glucose, Bld: 97 mg/dL (ref 65–99)
Potassium: 3.3 mmol/L — ABNORMAL LOW (ref 3.5–5.1)
Sodium: 138 mmol/L (ref 135–145)
Total Bilirubin: 0.7 mg/dL (ref 0.3–1.2)
Total Protein: 5.3 g/dL — ABNORMAL LOW (ref 6.5–8.1)

## 2017-03-27 LAB — CBC WITH DIFFERENTIAL/PLATELET
Basophils Absolute: 0 10*3/uL (ref 0.0–0.1)
Basophils Relative: 0 %
Eosinophils Absolute: 0.1 10*3/uL (ref 0.0–0.7)
Eosinophils Relative: 1 %
HCT: 38.1 % — ABNORMAL LOW (ref 39.0–52.0)
Hemoglobin: 12.2 g/dL — ABNORMAL LOW (ref 13.0–17.0)
Lymphocytes Relative: 15 %
Lymphs Abs: 0.8 10*3/uL (ref 0.7–4.0)
MCH: 28.5 pg (ref 26.0–34.0)
MCHC: 32 g/dL (ref 30.0–36.0)
MCV: 89 fL (ref 78.0–100.0)
Monocytes Absolute: 0.8 10*3/uL (ref 0.1–1.0)
Monocytes Relative: 15 %
Neutro Abs: 3.7 10*3/uL (ref 1.7–7.7)
Neutrophils Relative %: 69 %
Platelets: 163 10*3/uL (ref 150–400)
RBC: 4.28 MIL/uL (ref 4.22–5.81)
RDW: 14.2 % (ref 11.5–15.5)
WBC: 5.4 10*3/uL (ref 4.0–10.5)

## 2017-03-27 LAB — CK: Total CK: 32 U/L — ABNORMAL LOW (ref 49–397)

## 2017-03-27 LAB — CREATININE, SERUM
Creatinine, Ser: 0.85 mg/dL (ref 0.61–1.24)
GFR calc Af Amer: >60 mL/min (ref >60)
GFR calc non Af Amer: >60 mL/min (ref >60)

## 2017-03-27 LAB — TROPONIN I
Troponin I: <0.03 ng/mL (ref <0.03)
Troponin I: <0.03 ng/mL (ref <0.03)
Troponin I: <0.03 ng/mL (ref <0.03)

## 2017-03-27 LAB — TSH: TSH: 0.465 u[IU]/mL (ref 0.350–4.500)

## 2017-03-27 LAB — HIV ANTIBODY (ROUTINE TESTING W REFLEX): HIV Screen 4th Generation wRfx: NONREACTIVE

## 2017-03-27 LAB — D-DIMER, QUANTITATIVE: D-Dimer, Quant: 0.64 ug/mL-FEU — ABNORMAL HIGH (ref 0.00–0.50)

## 2017-03-27 MED ORDER — ACETAMINOPHEN 325 MG PO TABS: 650.0000 mg | ORAL_TABLET | Freq: Four times a day (QID) | ORAL | Status: DC | PRN

## 2017-03-27 MED ORDER — ACETAMINOPHEN 650 MG RE SUPP: 650.0000 mg | Freq: Four times a day (QID) | RECTAL | Status: DC | PRN

## 2017-03-27 MED ORDER — ONDANSETRON HCL 4 MG/2ML IJ SOLN: 4.0000 mg | Freq: Four times a day (QID) | INTRAMUSCULAR | Status: DC | PRN

## 2017-03-27 MED ORDER — DEXTROSE 5 % IV SOLN
1.0000 g | INTRAVENOUS | Status: DC
  Administered 2017-03-27 – 2017-03-28 (×2): 1 g via INTRAVENOUS
  Filled 2017-03-27 (×3): qty 10

## 2017-03-27 MED ORDER — ENSURE ENLIVE PO LIQD
237.0000 mL | Freq: Two times a day (BID) | ORAL | Status: DC
  Administered 2017-03-28: 237 mL via ORAL

## 2017-03-27 MED ORDER — SODIUM CHLORIDE 0.9 % IV SOLN
INTRAVENOUS | Status: AC
  Administered 2017-03-27 (×2): via INTRAVENOUS

## 2017-03-27 MED ORDER — ONDANSETRON HCL 4 MG PO TABS: 4.0000 mg | ORAL_TABLET | Freq: Four times a day (QID) | ORAL | Status: DC | PRN

## 2017-03-27 MED ORDER — HYDROCODONE-ACETAMINOPHEN 10-325 MG PO TABS
1.0000 | ORAL_TABLET | Freq: Four times a day (QID) | ORAL | Status: DC | PRN
  Administered 2017-03-27 – 2017-03-28 (×4): 1 via ORAL
  Filled 2017-03-27 (×4): qty 1

## 2017-03-27 MED ORDER — ENOXAPARIN SODIUM 40 MG/0.4ML ~~LOC~~ SOLN
40.0000 mg | SUBCUTANEOUS | Status: DC
  Administered 2017-03-27 – 2017-03-28 (×2): 40 mg via SUBCUTANEOUS
  Filled 2017-03-27 (×2): qty 0.4

## 2017-03-27 NOTE — Progress Notes (Signed)
Bilateral lower extremity venous duplex completed. No evidence of DVT, superficial thrombosis, or Baker's cyst. Cory DeitersVirginia Sira Johnson, RVS  03/27/2017, 10:11 AM

## 2017-03-27 NOTE — Procedures (Signed)
HPI:  62 y/o with syncope  TECHNICAL SUMMARY:  A multichannel referential and bipolar montage EEG using the standard international 10-20 system was performed on the patient.  The dominant background activity consists of 6-7 hertz activity seen most prominantly over the posterior head region.  The backgound activity is nonreactive to eye opening and closing procedures.  Low voltage fast (beta) activity is distributed symmetrically and maximally over the anterior head regions.  ACTIVATION:  Stepwise photic stimulation at 4-20 flashes per second was performed and did not elicit any abnormal waveforms.  Hyperventilation was not performed.  EPILEPTIFORM ACTIVITY:  There were no spikes, sharp waves or paroxysmal activity.  SLEEP:  Physiologic drowsiness but no stage 2 sleep  CARDIAC:  The EKG lead was not well recorded on this study  IMPRESSION:  This is an abnormal EEG demonstrating a mild diffuse slowing of electrocerebral activity.  This can be seen in a wide variety of encephalopathic state including those of a toxic, metabolic, or degenerative nature.  There were no focal, hemispheric, or lateralizing features.  No epileptiform activity was recorded.  Correlate clinically

## 2017-03-27 NOTE — Progress Notes (Signed)
EEG complete - results pending 

## 2017-03-27 NOTE — H&P (Signed)
History and Physical    LOT MEDFORD ZOX:[id number] DOB: [birth year] DOA: 03/26/2017  PCP: Patient, No Pcp Per  Patient coming from: Home.  Chief Complaint: Loss of consciousness.  HPI: Cory Johnson is a 62 y.o. male with history of tremors, history of pulmonary embolism in 2011 was brought to the ER after patient had a syncopal episode at home.  As per the patient and patient's wife patient has been feeling dizzy on standing since yesterday morning.  Denies any nausea vomiting abdominal pain diarrhea shortness of breath.  Patient states he had mild chest discomfort for a few seconds prior to the fall.  Patient's wife on arrival at home last evening found patient on the floor and just not known exactly how long patient was on the floor.  No incontinence of urine or tongue bite.  ED Course: In the ER patient appears nonfocal.  On exam patient has tremors on the right upper extremity.  Patient has chronic pain and is unable to lie flat for any CAT scans.  Chest x-ray was unremarkable UA showing possibility of UTI.  EKG shows normal sinus rhythm with QTC of 423 ms.  Patient is being admitted for syncopal episode.  Patient had similar episode 3 months ago at the time patient was observed in the ER and discharged home.  Review of Systems: As per HPI, rest all negative.   Past Medical History:  Diagnosis Date  . Acute pulmonary embolus (HCC) 11/07/2009  . Arthritis    neck - limited range of motion; back, shoulders  . Chronic back pain   . Difficult intubation   . DVT of lower extremity (deep venous thrombosis) (HCC) 11/07/2009  . Right inguinal hernia 02/2011  . Seizures (HCC)   . Umbilical hernia 02/2011    Past Surgical History:  Procedure Laterality Date  . HERNIA REPAIR    . INGUINAL HERNIA REPAIR  03/17/2011   Procedure: HERNIA REPAIR INGUINAL ADULT;  Surgeon: Ernestene Mention, MD;  Location: [facility];  Service: General;  Laterality: Right;  right inguinal  hernia repair with mesh  . KNEE SURGERY  1977   left  . UMBILICAL HERNIA REPAIR  03/17/2011   Procedure: HERNIA REPAIR UMBILICAL ADULT;  Surgeon: Ernestene Mention, MD;  Location: [facility];  Service: General;  Laterality: N/A;  umbilical hernia repair with mesh     reports that  has never smoked. he has never used smokeless tobacco. He reports that he does not drink alcohol or use drugs.  No Known Allergies  Family History  Problem Relation Age of Onset  . Hypertension Other     Prior to Admission medications   Medication Sig Start Date End Date Taking? Authorizing Provider  HYDROcodone-acetaminophen (NORCO) 10-325 MG tablet Take 1 tablet by mouth every 6 (six) hours as needed (for pain).   Yes [provider]  carbidopa-levodopa (SINEMET) 25-100 MG tablet Take 1 tablet by mouth 3 (three) times daily. Patient not taking: Reported on 03/26/2017 06/29/16   Levert Feinstein, MD  donepezil (ARICEPT) 10 MG tablet Take 1 tablet (10 mg total) by mouth at bedtime. 1 tab daily for 2 weeks then increase to 2 tabs Patient not taking: Reported on 03/26/2017 06/29/16   Levert Feinstein, MD  HYDROcodone-acetaminophen (NORCO) 5-325 MG tablet Take 1 tablet by mouth every 6 (six) hours as needed for moderate pain. Patient not taking: Reported on 03/26/2017 12/13/16   Georgina Quint, MD  memantine [facility]) 10 MG tablet  Take 1 tablet (10 mg total) by mouth 2 (two) times daily. Patient not taking: Reported on 03/26/2017 06/29/16   Levert FeinsteinYan, Yijun, MD  oxyCODONE-acetaminophen (PERCOCET/ROXICET) 5-325 MG tablet Take 1 tablet by mouth every 6 (six) hours as needed for severe pain. Patient not taking: Reported on 03/26/2017 07/17/16   Fayrene Helperran, [place], PA-C    Physical Exam: Vitals:   03/26/17 2315 03/26/17 2345 03/27/17 0000 03/27/17 0015  BP: 102/68 (!) 113/95 114/67 102/84  Pulse: (!) 57   87  Resp: 12 13    Temp:      TempSrc:      SpO2: 100% 97%  98%  Weight:      Height:           Constitutional: Moderately built and nourished. Vitals:   03/26/17 2315 03/26/17 2345 03/27/17 0000 03/27/17 0015  BP: 102/68 (!) 113/95 114/67 102/84  Pulse: (!) 57   87  Resp: 12 13    Temp:      TempSrc:      SpO2: 100% 97%  98%  Weight:      Height:       Eyes: Anicteric no pallor. ENMT: No discharge from the ears eyes nose or mouth. Neck: No mass felt.  No neck rigidity.  No JVD appreciated. Respiratory: No rhonchi or crepitations. Cardiovascular: S1-S2 heard no murmurs appreciated. Abdomen: Soft nontender bowel sounds present. Musculoskeletal: No edema.  No joint effusion. Skin: No rash.  Skin appears warm. Neurologic: Alert awake oriented to time place and person.  Moves all extremities 5 x 5.  No facial asymmetry tongue is midline. Psychiatric: Appears normal.  Normal affect.   Labs on Admission: I have personally reviewed following labs and imaging studies  CBC: Recent Labs  Lab 03/26/17 1724  WBC 5.5  HGB 14.1  HCT 43.6  MCV 89.7  PLT 180   Basic Metabolic Panel: Recent Labs  Lab 03/26/17 1724  NA 136  K 3.6  CL 101  CO2 26  GLUCOSE 87  BUN 10  CREATININE 0.96  CALCIUM 8.8*   GFR: Estimated Creatinine Clearance: 86.4 mL/min (by C-G formula based on SCr of 0.96 mg/dL). Liver Function Tests: No results for input(s): AST, ALT, ALKPHOS, BILITOT, PROT, ALBUMIN in the last 168 hours. No results for input(s): LIPASE, AMYLASE in the last 168 hours. No results for input(s): AMMONIA in the last 168 hours. Coagulation Profile: No results for input(s): INR, PROTIME in the last 168 hours. Cardiac Enzymes: No results for input(s): CKTOTAL, CKMB, CKMBINDEX, TROPONINI in the last 168 hours. BNP (last 3 results) No results for input(s): PROBNP in the last 8760 hours. HbA1C: No results for input(s): HGBA1C in the last 72 hours. CBG: Recent Labs  Lab 03/26/17 2248  GLUCAP 73   Lipid Profile: No results for input(s): CHOL, HDL, LDLCALC, TRIG,  CHOLHDL, LDLDIRECT in the last 72 hours. Thyroid Function Tests: No results for input(s): TSH, T4TOTAL, FREET4, T3FREE, THYROIDAB in the last 72 hours. Anemia Panel: No results for input(s): VITAMINB12, FOLATE, FERRITIN, TIBC, IRON, RETICCTPCT in the last 72 hours. Urine analysis:    Component Value Date/Time   COLORURINE AMBER (A) 03/26/2017 2223   APPEARANCEUR HAZY (A) 03/26/2017 2223   LABSPEC 1.031 (H) 03/26/2017 2223   PHURINE 5.0 03/26/2017 2223   GLUCOSEU NEGATIVE 03/26/2017 2223   HGBUR NEGATIVE 03/26/2017 2223   BILIRUBINUR NEGATIVE 03/26/2017 2223   BILIRUBINUR negative 01/10/2016 1246   BILIRUBINUR NEG 12/04/2013 1458   KETONESUR 5 (A) 03/26/2017 2223  PROTEINUR NEGATIVE 03/26/2017 2223   UROBILINOGEN 0.2 01/10/2016 1246   NITRITE NEGATIVE 03/26/2017 2223   LEUKOCYTESUR MODERATE (A) 03/26/2017 2223   Sepsis Labs: @LABRCNTIP (procalcitonin:4,lacticidven:4) )No results found for this or any previous visit (from the past 240 hour(s)).   Radiological Exams on Admission: Dg Chest 2 View  Result Date: 03/26/2017 CLINICAL DATA:  Syncope and dizziness EXAM: CHEST  2 VIEW COMPARISON:  08/20/2012 FINDINGS: The heart size and mediastinal contours are within normal limits. Both lungs are clear. The visualized skeletal structures are unremarkable. IMPRESSION: No active cardiopulmonary disease. Electronically Signed   By: Signa Kell M.D.   On: 03/26/2017 21:33    EKG: Independently reviewed.  Normal sinus rhythm QTc of 423 ms QRS of 95 ms.  Assessment/Plan Principal Problem:   Syncope and collapse Active Problems:   Tremors of nervous system    1. Syncope -cause not clear.  Given the history of previous pulmonary embolism will check d-dimer.  Patient also had a brief episode of chest discomfort for which we will cycle cardiac markers check 2D echo.  Check orthostatics.  Check EEG.  Get physical therapy consult. 2. History of tremors presently not on any  medications. 3. Possible UTI -check urine culture patient has been placed on ceftriaxone. 4. Chronic pain on hydrocodone.  Patient states he cannot lie flat for CAT scan.   DVT prophylaxis: Lovenox. Code Status: Full code. Family Communication: Patient's wife. Disposition Plan: Home. Consults called: Physical therapy. Admission status: Observation.   Eduard Clos MD [facility] Pager [phone].  If 7PM-7AM, please contact night-coverage [email or web address] Password [facility]  03/27/2017, 12:34 AM

## 2017-03-27 NOTE — ED Notes (Signed)
Attempted report 

## 2017-03-27 NOTE — ED Notes (Signed)
Patient having test done at this time. 

## 2017-03-27 NOTE — Evaluation (Signed)
Physical Therapy Evaluation Patient Details Name: Cory Johnson MRN: [id number] DOB: Feb 04, 1956 Today's Date: 03/27/2017   History of Present Illness  Brought to the ED after being found down at home;  has a past medical history of Acute pulmonary embolus (HCC) (11/07/2009), Arthritis, Chronic back pain, Difficult intubation, DVT of lower extremity (deep venous thrombosis) (HCC) (11/07/2009), Occasional tremors (03/2017), Right inguinal hernia (02/2011), Seizures (HCC), Syncope and collapse (03/26/2017), and Umbilical hernia (02/2011).  Clinical Impression   Pt admitted with above diagnosis. Pt currently with functional limitations due to the deficits listed below (see PT Problem List). Will need to verify PLOF and home situation with family member; Question reliability as a historian; Still, overall minguard assist with bed mobility and transfers; didn't get to walk hallways due to imminent EEG at time of PT eval; plan for progressive amb and rec for optimal assistive device discernment next session;  Pt will benefit from skilled PT to increase their independence and safety with mobility to allow discharge to the venue listed below.       Follow Up Recommendations Home health PT    Equipment Recommendations  (already has cane, RW; consider shower chair)    Recommendations for Other Services       Precautions / Restrictions Precautions Precautions: Fall      Mobility  Bed Mobility Overal bed mobility: Needs Assistance Bed Mobility: Supine to Sit     Supine to sit: Min guard     General bed mobility comments: Cues to self-monitor for activity tolerance  Transfers Overall transfer level: Needs assistance Equipment used: 2 person hand held assist Transfers: Sit to/from Stand Sit to Stand: Min guard         General transfer comment: Noted dependence on momentum for sit to stand; minguard for safety  Ambulation/Gait Ambulation/Gait assistance: Min guard Ambulation  Distance (Feet): (March in place at [facility] (getting ready for EEG) Assistive device: 2 person hand held assist       General Gait Details: minguard for safety; mildly unsteady  Stairs            Wheelchair Mobility    Modified Rankin (Stroke Patients Only)       Balance                                             Pertinent Vitals/Pain Pain Assessment: No/denies pain    Home Living Family/patient expects to be discharged to:: Private residence Living Arrangements: Spouse/significant other;Children Available Help at Discharge: Family;Available PRN/intermittently Type of Home: House Home Access: Stairs to enter   [employer] of Steps: 3(needs to be verified)     Additional Comments: Need to verify more info re: any stairs to enter home or if stairs inside home    Prior Function Level of Independence: Independent         Comments: Independent and not needing an assistive device per patient report; this will need to be verified with his wife; RN voiced concerns re: reliability as a historian     Hand Dominance        Extremity/Trunk Assessment   Upper Extremity Assessment Upper Extremity Assessment: Overall WFL for tasks assessed    Lower Extremity Assessment Lower Extremity Assessment: Generalized weakness    Cervical / Trunk Assessment Cervical / Trunk Assessment: Other exceptions Cervical / Trunk Exceptions: head forward posture; pt tells me his  neck fused "without surgery"  Communication   Communication: No difficulties  Cognition Arousal/Alertness: Awake/alert Behavior During Therapy: WFL for tasks assessed/performed Overall Cognitive Status: No family/caregiver present to determine baseline cognitive functioning                                 General Comments: Per chart, pt was found down when wife came home from work; when [employer] asked pt he told her he thinks he fell while she had stepped out; Will need  to verify home situation and PLOF with family member      General Comments   03/27/17 1345  Vital Signs  Patient Position (if appropriate) Orthostatic Vitals  Orthostatic Lying   BP- Lying 121/88  Pulse- Lying 67  Orthostatic Sitting  BP- Sitting (!) 132/91  Pulse- Sitting 73  Orthostatic Standing at 0 minutes  BP- Standing at 0 minutes 107/83  Pulse- Standing at 0 minutes 79  Orthostatic Standing at 3 minutes  BP- Standing at 3 minutes 127/77  Pulse- Standing at 3 minutes 81         Exercises     Assessment/Plan    PT Assessment Patient needs continued PT services  PT Problem List Decreased strength;Decreased activity tolerance;Decreased balance;Decreased mobility;Decreased knowledge of use of DME;Decreased cognition;Decreased safety awareness       PT Treatment Interventions DME instruction;Gait training;Stair training;Functional mobility training;Therapeutic activities;Therapeutic exercise;Patient/family education    PT Goals (Current goals can be found in the Care Plan section)  Acute Rehab PT Goals Patient Stated Goal: did not state PT Goal Formulation: With patient Time For Goal Achievement: 04/10/17 Potential to Achieve Goals: Good    Frequency Min 3X/week   Barriers to discharge Decreased caregiver support Wife works days    Co-evaluation               AM-PAC PT "6 Clicks" Daily Activity  Outcome Measure Difficulty turning over in bed (including adjusting bedclothes, sheets and blankets)?: None Difficulty moving from lying on back to sitting on the side of the bed? : None Difficulty sitting down on and standing up from a chair with arms (e.g., wheelchair, bedside commode, etc,.)?: A Little Help needed moving to and from a bed to chair (including a wheelchair)?: None Help needed walking in hospital room?: A Little Help needed climbing 3-5 steps with a railing? : A Little 6 Click Score: 21    End of Session   Activity Tolerance: Patient  tolerated treatment well Patient left: in bed;with call bell/phone within reach(readying for EEG) Nurse Communication: Mobility status PT Visit Diagnosis: Unsteadiness on feet (R26.81);Other abnormalities of gait and mobility (R26.89);History of falling (Z91.81)    Time: [id number] PT Time Calculation (min) (ACUTE ONLY): 19 min   Charges:   PT Evaluation $PT Eval Low Complexity: 1 Low     PT G Codes:        Van ClinesHolly Ibrahem Volkman, PT  Acute Rehabilitation Services Pager [phone] Office [phone]   Levi AlandHolly H Darnell Jeschke 03/27/2017, 4:19 PM

## 2017-03-27 NOTE — Progress Notes (Signed)
Patient admitted after midnight, please see H&P.  Await PT eval.  Sounds like weakness caused him to fall.  He remembers falling.    Cory CanaryJessica Vann DO

## 2017-03-27 NOTE — ED Notes (Signed)
Patient has urinal at bedside.  

## 2017-03-27 NOTE — ED Notes (Signed)
Patient transported to Vascular 

## 2017-03-28 ENCOUNTER — Observation Stay (HOSPITAL_BASED_OUTPATIENT_CLINIC_OR_DEPARTMENT_OTHER): Payer: 59

## 2017-03-28 DIAGNOSIS — E440 Moderate protein-calorie malnutrition: Secondary | ICD-10-CM | POA: Diagnosis not present

## 2017-03-28 DIAGNOSIS — R251 Tremor, unspecified: Secondary | ICD-10-CM

## 2017-03-28 DIAGNOSIS — I361 Nonrheumatic tricuspid (valve) insufficiency: Secondary | ICD-10-CM

## 2017-03-28 DIAGNOSIS — R55 Syncope and collapse: Secondary | ICD-10-CM | POA: Diagnosis not present

## 2017-03-28 LAB — POTASSIUM: Potassium: 3.9 mmol/L (ref 3.5–5.1)

## 2017-03-28 LAB — ECHOCARDIOGRAM COMPLETE
Height: 74 in
Weight: 3187.2 oz

## 2017-03-28 LAB — MAGNESIUM: Magnesium: 1.8 mg/dL (ref 1.7–2.4)

## 2017-03-28 MED ORDER — MAGNESIUM SULFATE 2 GM/50ML IV SOLN
2.0000 g | Freq: Once | INTRAVENOUS | Status: AC
  Administered 2017-03-28: 2 g via INTRAVENOUS
  Filled 2017-03-28: qty 50

## 2017-03-28 NOTE — Progress Notes (Signed)
Initial Nutrition Assessment  DOCUMENTATION CODES:   Non-severe (moderate) malnutrition in context of chronic illness  INTERVENTION:  Ensure Enlive PO BID; each supplement provides 350 kcal and 20 gm protein.   NUTRITION DIAGNOSIS:   Moderate Malnutrition related to chronic illness(Pain r/t broken bone, dental extraction) as evidenced by mild fat depletion, mild muscle depletion.    GOAL:   Patient will meet greater than or equal to 90% of their needs    MONITOR:   PO intake, Supplement acceptance  REASON FOR ASSESSMENT:   Malnutrition Screening Tool    ASSESSMENT:    62 yo male with PMH tremors, arthritis, seizures, pulmonary embolism, chronic pain admitted s/p syncopal event at home with UTI.   Meds reviewed and include MgS Labs reviewed: Low K, has been repleted (3.9) Pt reports poor intake during the past year r/t dental extraction and broken arm. Reports goods appetite currently. Eats three meals/day at home and prepares meals on own. Intake in hospital also good. Despite tremors, pt denies difficulty preparing meals or eating food.  Pt also reported trying to eat healthier by skipping desserts in evening.  Pt encouraged to have evening snacks but choose nutritious foods. Encouraged pt to try adding Ensure daily and provided pt with Ensure to sample during visit.   Tremors could cause difficulty with self-feeding and are likely increasing energy expenditure and calorie requirement.  NUTRITION - FOCUSED PHYSICAL EXAM:    Most Recent Value  Orbital Region  Mild depletion  Upper Arm Region  No depletion  Thoracic and Lumbar Region  No depletion  Buccal Region  Mild depletion  Temple Region  Mild depletion  Clavicle Bone Region  Mild depletion  Clavicle and Acromion Bone Region  No depletion  Scapular Bone Region  Mild depletion  Dorsal Hand  Mild depletion  Patellar Region  Moderate depletion  Anterior Thigh Region  Moderate depletion  Posterior Calf Region   No depletion  Edema (RD Assessment)  None  Hair  Reviewed  Eyes  Reviewed  Mouth  Reviewed  Skin  Reviewed  Nails  Reviewed       Diet Order:  Diet regular Room service appropriate? Yes; Fluid consistency: Thin  EDUCATION NEEDS:   Education needs have been addressed  Skin:  Skin Assessment: Reviewed RN Assessment  Last BM:  1/14   Height:   Ht Readings from Last 1 Encounters:  03/28/17 6\' 2"  (1.88 m)    Weight:   Wt Readings from Last 1 Encounters:  03/28/17 199 lb 3.2 oz (90.4 kg)    Ideal Body Weight:  86.4 kg  BMI:  Body mass index is 25.58 kg/m.  Estimated Nutritional Needs:   Kcal:  2,100-2,300  Protein:  105-120 gm  Fluid:  2.1-2.3 L    Marjie Skiffherie N. Luva Metzger, MS Dietetic Intern

## 2017-03-28 NOTE — Progress Notes (Signed)
The patient and his wife have been given discharged instructions along with a new medication list. He has a follow up appointment and education on the referral for 30 day event monitor. He is discharging with his wife via car.   Sheppard Evensina Merilyn Pagan RN

## 2017-03-28 NOTE — Progress Notes (Signed)
Physical Therapy Treatment Patient Details Name: Cory DressKenneth B Brunty MRN: [id number] DOB: 1955-05-28 Today's Date: 03/28/2017    History of Present Illness Pt is a 62 y/o male presenting to the ED after being found down at home;  has a past medical history of Acute pulmonary embolus (HCC) (11/07/2009), Arthritis, Chronic back pain, Difficult intubation, DVT of lower extremity (deep venous thrombosis) (HCC) (11/07/2009), Occasional tremors (03/2017), Right inguinal hernia (02/2011), Seizures (HCC), Syncope and collapse (03/26/2017), and Umbilical hernia (02/2011).    PT Comments    Pt making steady progress with functional mobility. He tolerated further ambulation this session within his room without use of an AD or UE supports. HR maintained at 60-70 bpm. Pt would continue to benefit from skilled physical therapy services at this time while admitted and after d/c to address the below listed limitations in order to improve overall safety and independence with functional mobility.    Follow Up Recommendations  Home health PT     Equipment Recommendations  Other (comment)(consider shower seat/chair)    Recommendations for Other Services       Precautions / Restrictions Precautions Precautions: Fall Restrictions Weight Bearing Restrictions: No    Mobility  Bed Mobility Overal bed mobility: Needs Assistance Bed Mobility: Supine to Sit     Supine to sit: Min guard     General bed mobility comments: increased time and effort, HOB elevated, min guard for safety  Transfers Overall transfer level: Needs assistance Equipment used: None Transfers: Sit to/from Stand Sit to Stand: Min guard         General transfer comment: min guard for safety, no AD or UE supports needed  Ambulation/Gait Ambulation/Gait assistance: Min guard Ambulation Distance (Feet): 25 Feet Assistive device: None Gait Pattern/deviations: Step-to pattern;Step-through pattern;Decreased step length -  right;Decreased step length - left;Decreased stride length;Shuffle Gait velocity: decreased Gait velocity interpretation: Below normal speed for age/gender General Gait Details: pt with mild instability but no overt LOB or need for physical assistance, min guard for safety without use of an AD or UE supports   Stairs            Wheelchair Mobility    Modified Rankin (Stroke Patients Only)       Balance Overall balance assessment: Needs assistance Sitting-balance support: Feet supported Sitting balance-Leahy Scale: Good     Standing balance support: During functional activity;No upper extremity supported Standing balance-Leahy Scale: Fair                              Cognition Arousal/Alertness: Awake/alert Behavior During Therapy: Flat affect Overall Cognitive Status: Within Functional Limits for tasks assessed                                 General Comments: cognition not formally assessed but [facility] for general conversation      Exercises      General Comments        Pertinent Vitals/Pain Pain Assessment: No/denies pain    Home Living                      Prior Function            PT Goals (current goals can now be found in the care plan section) Acute Rehab PT Goals PT Goal Formulation: With patient Time For Goal Achievement: 04/10/17 Potential to Achieve Goals: Good  Progress towards PT goals: Progressing toward goals    Frequency    Min 3X/week      PT Plan Current plan remains appropriate    Co-evaluation              AM-PAC PT "6 Clicks" Daily Activity  Outcome Measure  Difficulty turning over in bed (including adjusting bedclothes, sheets and blankets)?: None Difficulty moving from lying on back to sitting on the side of the bed? : None Difficulty sitting down on and standing up from a chair with arms (e.g., wheelchair, bedside commode, etc,.)?: A Little Help needed moving to and from a bed  to chair (including a wheelchair)?: None Help needed walking in hospital room?: A Little Help needed climbing 3-5 steps with a railing? : A Little 6 Click Score: 21    End of Session   Activity Tolerance: Patient tolerated treatment well Patient left: in chair;with call bell/phone within reach;with chair alarm set Nurse Communication: Mobility status PT Visit Diagnosis: Unsteadiness on feet (R26.81);Other abnormalities of gait and mobility (R26.89);History of falling (Z91.81)     Time: 7829-5621 PT Time Calculation (min) (ACUTE ONLY): 12 min  Charges:  $Gait Training: 8-22 mins                    G Codes:       [place], [place], [place] [phone]    Alessandra Bevels Juliza Machnik 03/28/2017, 9:57 AM

## 2017-03-28 NOTE — Discharge Summary (Addendum)
Physician Discharge Summary  Cory Johnson ZOX:[id number] DOB: 04-03-1955 DOA: 03/26/2017  PCP: Shade Flood, MD  Admit date: 03/26/2017 Discharge date: 03/28/2017   Recommendations for Outpatient Follow-Up:   1. Needs compliance with medical recommendations 2. Needs to see Dr. Terrace Arabia- has stopped a number of medications 3. Referral to cards for event monitor 4. Refused home health   Discharge Diagnosis:   Principal Problem:   Syncope and collapse Active Problems:   Tremors of nervous system   Malnutrition of moderate degree   Discharge disposition:  Home with 24 hour supervision  Discharge Condition: Improved.  Diet recommendation: Low sodium, heart healthy.   Wound care: None.   History of Present Illness:    Cory Johnson is a 62 y.o. male with history of tremors, history of pulmonary embolism in 2011 was brought to the ER after patient had a syncopal episode at home.  As per the patient and patient's wife patient has been feeling dizzy on standing since yesterday morning.  Denies any nausea vomiting abdominal pain diarrhea shortness of breath.  Patient states he had mild chest discomfort for a few seconds prior to the fall.  Patient's wife on arrival at home last evening found patient on the floor and just not known exactly how long patient was on the floor.  No incontinence of urine or tongue bite.     Hospital Course by Problem:   Near-syncope/syncope -not clear what happened-- per patient he just leaned over and got weak-- never lost consciousness -echo -duplex Le negative (d dimer slightly higher than normal but doubt PE) -tele has PVCs/bigemeny--- 30 day event monitor  Tremors -off all meds -needs to follow with Neurology  Hypokalemia/hypomagnesemia -repleted  Malnutrition Type:  Nutrition Problem: Moderate Malnutrition Etiology: chronic illness(Pain r/t broken bone, dental extraction)   Malnutrition  Characteristics:  Signs/Symptoms: mild fat depletion, mild muscle depletion   Nutrition Interventions:  Interventions: Ensure Enlive (each supplement provides 350kcal and 20 grams of protein)     Medical Consultants:    None.   Discharge Exam:   Vitals:   03/27/17 2100 03/28/17 0440  BP: 118/74 118/84  Pulse: 63 (!) 53  Resp: 18 18  Temp: 98.3 F (36.8 C) 97.7 F (36.5 C)  SpO2: 100% 99%   Vitals:   03/27/17 1456 03/27/17 2100 03/28/17 0440 03/28/17 1041  BP: 92/60 118/74 118/84   Pulse: 70 63 (!) 53   Resp: 19 18 18    Temp: 98 F (36.7 C) 98.3 F (36.8 C) 97.7 F (36.5 C)   TempSrc: Oral Oral Oral   SpO2: 96% 100% 99%   Weight:   90.4 kg (199 lb 3.2 oz)   Height:    6\' 2"  (1.88 m)    Gen:  NAD    The results of significant diagnostics from this hospitalization (including imaging, microbiology, ancillary and laboratory) are listed below for reference.     Procedures and Diagnostic Studies:   Dg Chest 2 View  Result Date: 03/26/2017 CLINICAL DATA:  Syncope and dizziness EXAM: CHEST  2 VIEW COMPARISON:  08/20/2012 FINDINGS: The heart size and mediastinal contours are within normal limits. Both lungs are clear. The visualized skeletal structures are unremarkable. IMPRESSION: No active cardiopulmonary disease. Electronically Signed   By: Signa Kell M.D.   On: 03/26/2017 21:33     Labs:   Basic Metabolic Panel: Recent Labs  Lab 03/26/17 1724 03/27/17 0031 03/27/17 0445 03/28/17 0553  NA 136  --  138  --  K 3.6  --  3.3* 3.9  CL 101  --  106  --   CO2 26  --  24  --   GLUCOSE 87  --  97  --   BUN 10  --  10  --   CREATININE 0.96 0.85 0.72  --   CALCIUM 8.8*  --  8.1*  --   MG  --   --   --  1.8   GFR Estimated Creatinine Clearance: 109.9 mL/min (by C-G formula based on SCr of 0.72 mg/dL). Liver Function Tests: Recent Labs  Lab 03/27/17 0445  AST 13*  ALT 8*  ALKPHOS 68  BILITOT 0.7  PROT 5.3*  ALBUMIN 2.7*   No results for  input(s): LIPASE, AMYLASE in the last 168 hours. No results for input(s): AMMONIA in the last 168 hours. Coagulation profile No results for input(s): INR, PROTIME in the last 168 hours.  CBC: Recent Labs  Lab 03/26/17 1724 03/27/17 0031 03/27/17 0445  WBC 5.5 5.3 5.4  NEUTROABS  --   --  3.7  HGB 14.1 13.6 12.2*  HCT 43.6 41.2 38.1*  MCV 89.7 89.2 89.0  PLT 180 161 163   Cardiac Enzymes: Recent Labs  Lab 03/27/17 0031 03/27/17 0445 03/27/17 0532 03/27/17 1226  CKTOTAL  --   --  32*  --   TROPONINI <0.03 <0.03  --  <0.03   BNP: Invalid input(s): POCBNP CBG: Recent Labs  Lab 03/26/17 2248  GLUCAP 73   D-Dimer Recent Labs    03/27/17 0031  DDIMER 0.64*   Hgb A1c No results for input(s): HGBA1C in the last 72 hours. Lipid Profile No results for input(s): CHOL, HDL, LDLCALC, TRIG, CHOLHDL, LDLDIRECT in the last 72 hours. Thyroid function studies Recent Labs    03/27/17 0532  TSH 0.465   Anemia work up No results for input(s): VITAMINB12, FOLATE, FERRITIN, TIBC, IRON, RETICCTPCT in the last 72 hours. Microbiology No results found for this or any previous visit (from the past 240 hour(s)).   Discharge Instructions:   Discharge Instructions    Diet - low sodium heart healthy   Complete by:  As directed    Discharge instructions   Complete by:  As directed    [place] Law states no driving for 6 months of syncope free time Stay hydrated Please discuss with Dr. [name] the following medications that you have stopped taking: sinemet/aricept/namenda Will ask the [place] [facility] to refer you for 30 day event monitor   Increase activity slowly   Complete by:  As directed      Allergies as of 03/28/2017   No Known Allergies     Medication List    STOP taking these medications   carbidopa-levodopa 25-100 MG tablet Commonly known as:  SINEMET   donepezil 10 MG tablet Commonly known as:  ARICEPT   memantine 10 MG tablet Commonly known as:  NAMENDA    oxyCODONE-acetaminophen 5-325 MG tablet Commonly known as:  PERCOCET/ROXICET     TAKE these medications   HYDROcodone-acetaminophen 10-325 MG tablet Commonly known as:  NORCO Take 1 tablet by mouth every 6 (six) hours as needed (for pain). What changed:  Another medication with the same name was removed. Continue taking this medication, and follow the directions you see here.      Follow-up Information    Shade FloodGreene, Jeffrey R, MD Follow up in 1 week(s).   Specialties:  Family Medicine, Sports Medicine Why:  referral for 30 day event monitor  Contact information: [address] [place] [address] [phone]            Time coordinating discharge: 35 min  Signed:  Joseph Art   [facility] 03/28/2017, 4:32 PM

## 2017-03-28 NOTE — Discharge Instructions (Signed)
Cardiac Event Monitoring °A cardiac event monitor is a small recording device that is used to detect abnormal heart rhythms (arrhythmias). The monitor is used to record your heart rhythm when you have symptoms, such as: °· Fast heartbeats (palpitations), such as heart racing or fluttering. °· Dizziness. °· Fainting or light-headedness. °· Unexplained weakness. ° °Some monitors are wired to electrodes placed on your chest. Electrodes are flat, sticky disks that attach to your skin. Other monitors may be hand-held or worn on the wrist. The monitor can be worn for up to 30 days. °If the monitor is attached to your chest, a technician will prepare your chest for the electrode placement and show you how to work the monitor. Take time to practice using the monitor before you leave the office. Make sure you understand how to send the information from the monitor to your health care provider. In some cases, you may need to use a landline telephone instead of a cell phone. °What are the risks? °Generally, this device is safe to use, but it possible that the skin under the electrodes will become irritated. °How to use your cardiac event monitor °· Wear your monitor at all times, except when you are in water: °? Do not let the monitor get wet. °? Take the monitor off when you bathe. Do not swim or use a hot tub with it on. °· Keep your skin clean. Do not put body lotion or moisturizer on your chest. °· Change the electrodes as told by your health care provider or any time they stop sticking to your skin. You may need to use medical tape to keep them on. °· Try to put the electrodes in slightly different places on your chest to help prevent skin irritation. They must remain in the area under your left breast and in the upper right section of your chest. °· Make sure the monitor is safely clipped to your clothing or in a location close to your body that your health care provider recommends. °· Press the button to record as soon  as you feel heart-related symptoms, such as: °? Dizziness. °? Weakness. °? Light-headedness. °? Palpitations. °? Thumping or pounding in your chest. °? Shortness of breath. °? Unexplained weakness. °· Keep a diary of your activities, such as walking, doing chores, and taking medicine. It is very important to note what you were doing when you pushed the button to record your symptoms. This will help your health care provider determine what might be contributing to your symptoms. °· Send the recorded information as recommended by your health care provider. It may take some time for your health care provider to process the results. °· Change the batteries as told by your health care provider. °· Keep electronic devices away from your monitor. This includes: °? Tablets. °? MP3 players. °? Cell phones. °· While wearing your monitor you should avoid: °? Electric blankets. °? Electric razors. °? Electric toothbrushes. °? Microwave ovens. °? Magnets. °? Metal detectors. °Get help right away if: °· You have chest pain. °· You have extreme difficulty breathing or shortness of breath. °· You develop a very fast heartbeat that persists. °· You develop dizziness that does not go away. °· You faint or constantly feel like you are about to faint. °Summary °· A cardiac event monitor is a small recording device that is used to help detect abnormal heart rhythms (arrhythmias). °· The monitor is used to record your heart rhythm when you have heart-related symptoms. °· Make   sure you understand how to send the information from the monitor to your health care provider. °· It is important to press the button on the monitor when you have any heart-related symptoms. °· Keep a diary of your activities, such as walking, doing chores, and taking medicine. It is very important to note what you were doing when you pushed the button to record your symptoms. This will help your health care provider learn what might be causing your symptoms. °This  information is not intended to replace advice given to you by your health care provider. Make sure you discuss any questions you have with your health care provider. °Document Released: 12/07/2007 Document Revised: 02/12/2016 Document Reviewed: 02/12/2016 °Elsevier Interactive Patient Education © 2017 [employer] Inc. ° °

## 2017-03-29 LAB — URINE CULTURE: Culture: <10000 — AB

## 2017-04-02 DIAGNOSIS — Z79891 Long term (current) use of opiate analgesic: Secondary | ICD-10-CM | POA: Insufficient documentation

## 2017-04-08 DIAGNOSIS — M791 Myalgia, unspecified site: Secondary | ICD-10-CM | POA: Diagnosis not present

## 2017-04-12 ENCOUNTER — Ambulatory Visit: Payer: 59 | Admitting: Podiatry

## 2017-04-16 DIAGNOSIS — S42335G Nondisplaced oblique fracture of shaft of humerus, left arm, subsequent encounter for fracture with delayed healing: Secondary | ICD-10-CM | POA: Diagnosis not present

## 2017-04-16 DIAGNOSIS — M24542 Contracture, left hand: Secondary | ICD-10-CM | POA: Diagnosis not present

## 2017-04-16 DIAGNOSIS — M24549 Contracture, unspecified hand: Secondary | ICD-10-CM | POA: Insufficient documentation

## 2017-04-24 ENCOUNTER — Telehealth: Payer: Self-pay | Admitting: Family Medicine

## 2017-04-24 NOTE — Telephone Encounter (Signed)
Copied from CRM [phone]. Topic: Referral - Request >> Apr 24, 2017  5:43 PM Stephannie LiSimmons, Tauno Falotico L, NT wrote: Reason for CRM: Patient needs a referral to see a cardiac specialist  per [place] to have cardiac event monitoring  for 1 month done please advise [phone]

## 2017-04-30 ENCOUNTER — Ambulatory Visit: Payer: 59 | Admitting: Podiatry

## 2017-04-30 ENCOUNTER — Encounter: Payer: Self-pay | Admitting: Podiatry

## 2017-04-30 DIAGNOSIS — G609 Hereditary and idiopathic neuropathy, unspecified: Secondary | ICD-10-CM

## 2017-04-30 DIAGNOSIS — B351 Tinea unguium: Secondary | ICD-10-CM | POA: Diagnosis not present

## 2017-04-30 NOTE — Progress Notes (Signed)
Subjective: 62 y.o. year old male patient presents complaining of painful feet. Right great toe nail hurt in shoe.  Patient was accompanied by his sons. History of burning pain in foot x 2 year with no know reasons.  HPI: Tremor right hand unknown etiology duration of 1 year.  Objective: Dermatologic: Thick yellow deformed nails right 1 and 2. Vascular: Pedal pulses are all palpable. Orthopedic: No growth deformities. Neurologic: All epicritic and tactile sensations grossly intact.  Assessment: Dystrophic mycotic nails x 10. Peripheral neuropathy bilateral. Tremor right hand.  Treatment: All mycotic nails debrided.  Compound cream prescribed for burning feet. Return in 3 months or as needed.

## 2017-04-30 NOTE — Patient Instructions (Signed)
Seen for hypertrophic nails. Right great toe nail show improvement at the base of nail plate. May benefit from Compound cream for burning and painful feet. All nails debrided. Compound cream prescribed. Return in 3 months or sooner if needed.

## 2017-05-01 ENCOUNTER — Telehealth: Payer: Self-pay | Admitting: Family Medicine

## 2017-05-01 NOTE — Telephone Encounter (Signed)
Copied from CRM [phone]. Topic: Quick Communication - Rx Refill/Question >> May 01, 2017  3:22 PM [name], Cory Johnson: Medication: viagra  Pt called to question to see if he can get the Rx w/out a visit or by another physician who is not his pcp([facility]), contact pt to advise

## 2017-05-03 ENCOUNTER — Encounter: Payer: Self-pay | Admitting: Family Medicine

## 2017-05-03 ENCOUNTER — Other Ambulatory Visit: Payer: Self-pay

## 2017-05-03 ENCOUNTER — Ambulatory Visit (INDEPENDENT_AMBULATORY_CARE_PROVIDER_SITE_OTHER): Payer: 59 | Admitting: Family Medicine

## 2017-05-03 VITALS — BP 108/68 | HR 83 | Temp 97.6°F | Resp 18 | Ht 74.0 in | Wt 199.2 lb

## 2017-05-03 DIAGNOSIS — R251 Tremor, unspecified: Secondary | ICD-10-CM | POA: Diagnosis not present

## 2017-05-03 DIAGNOSIS — E876 Hypokalemia: Secondary | ICD-10-CM | POA: Diagnosis not present

## 2017-05-03 DIAGNOSIS — R55 Syncope and collapse: Secondary | ICD-10-CM | POA: Diagnosis not present

## 2017-05-03 DIAGNOSIS — R42 Dizziness and giddiness: Secondary | ICD-10-CM

## 2017-05-03 DIAGNOSIS — R499 Unspecified voice and resonance disorder: Secondary | ICD-10-CM

## 2017-05-03 DIAGNOSIS — R634 Abnormal weight loss: Secondary | ICD-10-CM | POA: Diagnosis not present

## 2017-05-03 DIAGNOSIS — I493 Ventricular premature depolarization: Secondary | ICD-10-CM

## 2017-05-03 DIAGNOSIS — E46 Unspecified protein-calorie malnutrition: Secondary | ICD-10-CM

## 2017-05-03 LAB — POCT CBC
Granulocyte percent: 77 %G (ref 37–80)
HCT, POC: 42.7 % — AB (ref 43.5–53.7)
Hemoglobin: 13.9 g/dL — AB (ref 14.1–18.1)
Lymph, poc: 0.9 (ref 0.6–3.4)
MCH, POC: 28.8 pg (ref 27–31.2)
MCHC: 32.5 g/dL (ref 31.8–35.4)
MCV: 88.7 fL (ref 80–97)
MID (cbc): 0.3 (ref 0–0.9)
MPV: 6.6 fL (ref 0–99.8)
POC Granulocyte: 4.2 (ref 2–6.9)
POC LYMPH PERCENT: 16.7 %L (ref 10–50)
POC MID %: 6.1 %M (ref 0–12)
Platelet Count, POC: 227 10*3/uL (ref 142–424)
RBC: 4.81 M/uL (ref 4.69–6.13)
RDW, POC: 14.1 %
WBC: 5.5 10*3/uL (ref 4.6–10.2)

## 2017-05-03 NOTE — Progress Notes (Signed)
See other note

## 2017-05-03 NOTE — Patient Instructions (Addendum)
Blood pressure was on the low side today, but better when I rechecked it. Make sure you drink plenty of fluids throughout the day, three meals per day with snacks as needed as well as Ensure shakes if needed for more calories and protein. I will recheck protein levels today.  recehck in next 2 days for blood pressure check, but if any lightheadedness or dizziness be seen in the emergency room right away or call 911.   Keep appointment with Dr. [name] this Monday.   I will refer you to a cardiologist for heart rhythm and your recent lightheadedness symptoms. Return to the clinic or go to the nearest emergency room if any of your symptoms worsen or new symptoms occur.   I would discuss your hydrocodone with Dr. Ethelene Halamos at your upcoming appointment as I am concerned that medicine may be worsening dizziness and lightheadedness.   I am concerned about your weight loss and will need to determine cause.  With the hoarse voice, and your family history of throat cancer, CT scan was ordered last year. I would still recommend that test and am happy to reorder it. I would discuss that test and ways to get comfortable enough to have it performed at your upcoming visit with pain management provider.     IF you received an x-ray today, you will receive an invoice from [facility]. Please contact [facility] at [phone] with questions or concerns regarding your invoice.   IF you received labwork today, you will receive an invoice from [facility]. Please contact [facility] at [phone] with questions or concerns regarding your invoice.   Our billing staff will not be able to assist you with questions regarding bills from these companies.  You will be contacted with the lab results as soon as they are available. The fastest way to get your results is to activate your My Chart account. Instructions are located on the last page of this paperwork. If you have not heard from us regarding the results in  2 weeks, please contact this office.

## 2017-05-03 NOTE — Progress Notes (Signed)
Subjective:  By signing my name below, I, Cory Johnson, attest that this documentation has been prepared under the direction and in the presence of Cory Seat Asencion Partridge, MD.  Electronically Signed: Rosana Johnson, Medical Scribe 05/03/17 at 12:38 PM  Patient was seen in Room 11   Patient ID: Cory Johnson, male    DOB: 05/14/1955, 62 y.o.   MRN: [id number] Chief Complaint  Patient presents with  . Hospitalization Follow-up    referral for cardiology    HPI Cory Johnson is a 62 y.o. male who presents to Primary Care at [facility] for follow up after recent hospitalization for syncope and collapse. He is accompanied by his son, Cory Johnson.   He has a hx of multiple medical problems including DVT/PE, tremor, peripheral neuropathy followed by podiatry and on chart review has been primarily seen by Dr. Alvy Bimler last year for tremors, toe pain, weight loss, drooling, voice change. When he was seen in April of last year for drooling and voice change, a CT neck and head were ordered as pt reports family hx of throat cancer. It does not appear he had that test performed.   Tremors He had a nerve conduction study in July 2018 by Dr. Terrace Arabia, his neurologist. She also recommended a DAT scan, which he canceled. His last office visit with Dr. Terrace Arabia was in April 2018 but has not had follow up with her since that time.   Hospital follow up Pt was admitted from the ED on 03/26/17 after he had a syncopal episode at home. He had reported symptoms of dizziness prior to the episode. Pt's wife arrived home and found him lying on the floor. No reports incontinence of urine or tongue bite. He had lower extremity duplex that was negative for DVT. He was found to have PVCs with bigeminy. Recommended 30 day monitor. He had an Echocardiogram on 03/28/17 with 55-60% with normal systolic function, normal wall function, grade 1 diastolic dysfunction, elevated central venous pressure, mildly increased pulmonary pressure, mild  regurgitation of tricuspid and mitral valve. He was found to be malnourished. He initially had low Mg and K that were repeated. He had low protein at 5.3, low Albumin at 5.7. Normal troponins. He had  recent dental extraction and prior fracture of left humorous, which is followed by Dr. Aundria [facility] at [facility]. He had a PT evaluation in treatment which recommend skilled physical therapy after discharge with home health PT but he refused. F/u with neurologist recommended as he had stopped all medications for tremor. Cardiology referral for arrhythmia was given. Of note his BP was 118/34 with a HR of 53 on 03/28/17 but low as 92/60 on 03/27/17.   Today, he reports he has had 2 episodes of lightheadedness since he was discharged from the hospital. He lives at home with his wife and children. He is continuing to experience tremors and hoarse voice change. He denies syncope, CP, SOB, dysphagia, neck swelling, blood in stool, melena or any other complaints at this time.  He is currently not taking any HTN medications or medications for ED. He reports he is taking Hydrocodone 4-5 times a day per [name] via pain management. He states he has an appointment with Dr. Terrace Arabia, his neurologist on 05/08/17. He has been taking this for ~ 12 years.    Malnutrition Wt Readings from Last 3 Encounters:  05/03/17 199 lb 3.2 oz (90.4 kg)  03/28/17 199 lb 3.2 oz (90.4 kg)  01/10/17 220 lb (99.8 kg)  During his hospital stay he was found to be malnourished. He initially had low Mg and K that were repeated. He had low protein at 5.3, low Albumin at 5.7. He notes he has tried to increase his eating but has not tried Ensure. Per pt, he is eating 3 meals a day with snacks. He drinks about 42 oz a day. He had a colonoscopy in March 2018, which was normal.   Over 15 minutes of chart review.   Patient Active Problem List   Diagnosis Date Noted  . Malnutrition of moderate degree 03/28/2017  . Syncope and collapse 03/26/2017  .  Subungual hematoma of great toe of right foot 12/13/2016  . Pain of toe of right foot 12/13/2016  . Paresthesia 06/29/2016  . Tremor of right hand 06/29/2016  . Mild cognitive impairment 06/29/2016  . Tremors of nervous system 06/22/2016  . Drooling 06/22/2016  . Loss of weight 06/22/2016  . Family history of throat cancer 06/22/2016  . Abnormality of gait 06/09/2016  . Acute bronchitis 03/20/2016  . Memory loss 12/31/2013  . Back pain 08/18/2013  . Arthritis   . Myoclonic jerking   . Right inguinal hernia 03/17/2011  . Umbilical hernia 03/17/2011  . Acute pulmonary embolus (HCC) 11/07/2009  . DVT of lower extremity (deep venous thrombosis) (HCC) 11/07/2009   Past Medical History:  Diagnosis Date  . Acute pulmonary embolus (HCC) 11/07/2009  . Arthritis    neck - limited range of motion; back, shoulders  . Chronic back pain   . Difficult intubation   . DVT of lower extremity (deep venous thrombosis) (HCC) 11/07/2009  . Occasional tremors 03/2017   HANDS   . Right inguinal hernia 02/2011  . Seizures (HCC)   . Syncope and collapse 03/26/2017  . Umbilical hernia 02/2011   Past Surgical History:  Procedure Laterality Date  . HERNIA REPAIR    . INGUINAL HERNIA REPAIR  03/17/2011   Procedure: HERNIA REPAIR INGUINAL ADULT;  Surgeon: Ernestene MentionHaywood M Ingram, MD;  Location: [facility];  Service: General;  Laterality: Right;  right inguinal hernia repair with mesh  . KNEE SURGERY  1977   left  . UMBILICAL HERNIA REPAIR  03/17/2011   Procedure: HERNIA REPAIR UMBILICAL ADULT;  Surgeon: Ernestene MentionHaywood M Ingram, MD;  Location: [facility];  Service: General;  Laterality: N/A;  umbilical hernia repair with mesh   No Known Allergies Prior to Admission medications   Medication Sig Start Date End Date Taking? Authorizing Provider  HYDROcodone-acetaminophen (NORCO) 10-325 MG tablet Take 1 tablet by mouth every 6 (six) hours as needed (for pain).   Yes [provider]    Social History   Socioeconomic History  . Marital status: Married    Spouse name: Cory Johnson  . Number of children: 5  . Years of education: college  . Highest education level: Not on file  Social Needs  . Financial resource strain: Not on file  . Food insecurity - worry: Not on file  . Food insecurity - inability: Not on file  . Transportation needs - medical: Not on file  . Transportation needs - non-medical: Not on file  Occupational History  . Occupation: DIRECTOR    Employer: [employer]  Tobacco Use  . Smoking status: Never Smoker  . Smokeless tobacco: Never Used  Substance and Sexual Activity  . Alcohol use: No  . Drug use: No  . Sexual activity: Yes    Birth control/protection: Surgical  Other Topics Concern  .  Not on file  Social History Narrative   Patient lives at home with his wife [name].   Disabled.   Education college.   Right handed.   Caffeine one cup of coffee daily.      Review of Systems  Constitutional: Positive for unexpected weight change (he has lost 21 lbs since Oct 2018).  HENT: Positive for voice change (hoarse voice).        (-) dysphagia  Respiratory: Negative for shortness of breath.   Cardiovascular: Negative for chest pain.  Gastrointestinal: Positive for constipation. Negative for blood in stool.  Neurological: Positive for dizziness, tremors and light-headedness. Negative for syncope.       Objective:   Physical Exam  Constitutional: He is oriented to person, place, and time. He appears well-developed and well-nourished.  HENT:  Head: Normocephalic and atraumatic.  Mouth/Throat: Oropharynx is clear and moist.  Moist mucosa  Eyes: Conjunctivae and EOM are normal. Pupils are equal, round, and reactive to light. No scleral icterus.  Neck: Neck supple. No JVD present. Carotid bruit is not present.  Cardiovascular: Normal rate and regular rhythm.  No murmur heard. Regular rate with frequent ectopic beats   Pulmonary/Chest: Effort normal and breath sounds normal. No respiratory distress. He has no rales.  Abdominal: He exhibits no distension.  Musculoskeletal: He exhibits no edema.  Minimal movement of his cervical spine, stuck in flexion  Lymphadenopathy:    He has no cervical adenopathy.  Neurological: He is alert and oriented to person, place, and time.  Persistent tremor of the right arm  Skin: Skin is warm and dry.  Psychiatric: He has a normal mood and affect.  Vitals reviewed.   Vitals:   05/03/17 1143 05/03/17 1252 05/03/17 1315  BP: 90/68 (!) 82/68 108/68  Pulse: 83    Resp: 18    Temp: 97.6 F (36.4 C)    TempSrc: Oral    SpO2: 96%    Weight: 199 lb 3.2 oz (90.4 kg)    Height: 6\' 2"  (1.88 m)     EKG: sinus rhythm with frequent PVC's, baseline artifact with tremor. Cardiology called to read EKG as well- read as sinus rhythm with PVC.       Assessment & Plan:  ELIAZER HEMPHILL is a 62 y.o. male Syncope, unspecified syncope type - Plan: POCT CBC, Ambulatory referral to Cardiology Malnutrition, unspecified type (HCC) - Plan: Comprehensive metabolic panel Episodic lightheadedness Loss of weight - Plan: Comprehensive metabolic panel  -Syncope/near syncopal symptoms possibly multifactorial including relative malnutrition, and side effects from chronic narcotic use. Denied problems with lightheadedness/dizziness with use of similar doses in past, but advised him to discuss current regimen with his pain management provider.   -Continue regular meals, nutritional supplements as needed, increase fluid intake throughout the day, check CMP to evaluate albumin/protein. Consider outpatient nutritionist  -Borderline hypotension, also may be related part due to his pain medications and relative volume depletion. Repeat testing improved. Recheck 2 days for blood pressure recheck. ER/911 precautions discussed if worsening or near-syncopal symptoms return  PVC (premature ventricular  contraction) - Plan: EKG 12-Lead, Ambulatory referral to Cardiology  -History of syncope as above, frequent PVCs on EKG and discussed with cardiology who read EKG. Plan for outpatient evaluation with cardiology, ER/911 precautions if worsening symptoms prior  Tremor  -Long-standing tremor, has not had recent neurology follow-up. Upcoming appointment with neuro. Likely can discuss medications at that time.  Change in voice  -See prior evaluation. With weight loss, and voice  change, would like to check neck CT to rule out mass.Marland Kitchen He does have some issues lying flat due to chronic neck issues. Advised to discuss ways to have that test with his upcoming pain management appointment. Can order CT as soon as he is ready  Hypokalemia - Plan: Comprehensive metabolic panel Hypomagnesemia - Plan: Magnesium  -Recheck labs since repletion in hospital  No orders of the defined types were placed in this encounter.  Patient Instructions   Blood pressure was on the low side today, but better when I rechecked it. Make sure you drink plenty of fluids throughout the day, three meals per day with snacks as needed as well as Ensure shakes if needed for more calories and protein. I will recheck protein levels today.  recehck in next 2 days for blood pressure check, but if any lightheadedness or dizziness be seen in the emergency room right away or call 911.   Keep appointment with Dr. Terrace Arabia this Monday.   I will refer you to a cardiologist for heart rhythm and your recent lightheadedness symptoms. Return to the clinic or go to the nearest emergency room if any of your symptoms worsen or new symptoms occur.   I would discuss your hydrocodone with [name] at your upcoming appointment as I am concerned that medicine may be worsening dizziness and lightheadedness.   I am concerned about your weight loss and will need to determine cause.  With the hoarse voice, and your family history of throat cancer, CT scan was  ordered last year. I would still recommend that test and am happy to reorder it. I would discuss that test and ways to get comfortable enough to have it performed at your upcoming visit with pain management provider.     IF you received an x-ray today, you will receive an invoice from [facility]. Please contact [facility] at [phone] with questions or concerns regarding your invoice.   IF you received labwork today, you will receive an invoice from [employer] [facility]. Please contact [facility] at [phone] with questions or concerns regarding your invoice.   Our billing staff will not be able to assist you with questions regarding bills from these companies.  You will be contacted with the lab results as soon as they are available. The fastest way to get your results is to activate your My Chart account. Instructions are located on the last page of this paperwork. If you have not heard from [place] regarding the results in 2 weeks, please contact this office.      I personally performed the services described in this documentation, which was scribed in my presence. The recorded information has been reviewed and considered for accuracy and completeness, addended by me as needed, and agree with information above.  Signed,   [name], MD Primary Care at [facility].  05/03/17 1:45 PM

## 2017-05-04 LAB — COMPREHENSIVE METABOLIC PANEL
ALT: 7 IU/L (ref 0–44)
AST: 9 IU/L (ref 0–40)
Albumin/Globulin Ratio: 1.2 (ref 1.2–2.2)
Albumin: 3.9 g/dL (ref 3.6–4.8)
Alkaline Phosphatase: 77 IU/L (ref 39–117)
BUN/Creatinine Ratio: 11 (ref 10–24)
BUN: 11 mg/dL (ref 8–27)
Bilirubin Total: 0.5 mg/dL (ref 0.0–1.2)
CO2: 26 mmol/L (ref 20–29)
Calcium: 9.2 mg/dL (ref 8.6–10.2)
Chloride: 100 mmol/L (ref 96–106)
Creatinine, Ser: 0.99 mg/dL (ref 0.76–1.27)
GFR calc Af Amer: 93 mL/min/{1.73_m2} (ref >59)
GFR calc non Af Amer: 81 mL/min/{1.73_m2} (ref >59)
Globulin, Total: 3.2 g/dL (ref 1.5–4.5)
Glucose: 85 mg/dL (ref 65–99)
Potassium: 4 mmol/L (ref 3.5–5.2)
Sodium: 141 mmol/L (ref 134–144)
Total Protein: 7.1 g/dL (ref 6.0–8.5)

## 2017-05-04 LAB — MAGNESIUM: Magnesium: 2.2 mg/dL (ref 1.6–2.3)

## 2017-05-05 ENCOUNTER — Encounter: Payer: Self-pay | Admitting: Emergency Medicine

## 2017-05-05 ENCOUNTER — Ambulatory Visit: Payer: 59 | Admitting: Emergency Medicine

## 2017-05-05 ENCOUNTER — Other Ambulatory Visit: Payer: Self-pay

## 2017-05-05 VITALS — BP 130/85 | HR 72 | Temp 97.7°F | Resp 16 | Ht 68.0 in | Wt 185.0 lb

## 2017-05-05 DIAGNOSIS — I959 Hypotension, unspecified: Secondary | ICD-10-CM | POA: Diagnosis not present

## 2017-05-05 NOTE — Patient Instructions (Signed)
     IF you received an x-ray today, you will receive an invoice from [facility]. Please contact [facility] at [phone] with questions or concerns regarding your invoice.   IF you received labwork today, you will receive an invoice from [facility]. Please contact [facility] at [phone] with questions or concerns regarding your invoice.   Our billing staff will not be able to assist you with questions regarding bills from these companies.  You will be contacted with the lab results as soon as they are available. The fastest way to get your results is to activate your My Chart account. Instructions are located on the last page of this paperwork. If you have not heard from us regarding the results in 2 weeks, please contact this office.     

## 2017-05-05 NOTE — Progress Notes (Signed)
BP Readings from Last 3 Encounters:  05/03/17 108/68  03/28/17 118/84  01/10/17 (!) 148/84   Cory Johnson 62 y.o.   Chief Complaint  Patient presents with  . Hypertension    HISTORY OF PRESENT ILLNESS: This is a 62 y.o. male here today for follow-up of symptomatic low blood pressure.  Feels better.  Symptoms gone.  The figure it was secondary to powerful herb supplement he was taking.  No concerns at this time.  HPI   Prior to Admission medications   Medication Sig Start Date End Date Taking? Authorizing Provider  HYDROcodone-acetaminophen (NORCO) 10-325 MG tablet Take 1 tablet by mouth every 6 (six) hours as needed (for pain).   Yes [provider]    No Known Allergies  Patient Active Problem List   Diagnosis Date Noted  . Malnutrition of moderate degree 03/28/2017  . Syncope and collapse 03/26/2017  . Subungual hematoma of great toe of right foot 12/13/2016  . Pain of toe of right foot 12/13/2016  . Paresthesia 06/29/2016  . Tremor of right hand 06/29/2016  . Mild cognitive impairment 06/29/2016  . Tremors of nervous system 06/22/2016  . Drooling 06/22/2016  . Loss of weight 06/22/2016  . Family history of throat cancer 06/22/2016  . Abnormality of gait 06/09/2016  . Acute bronchitis 03/20/2016  . Memory loss 12/31/2013  . Back pain 08/18/2013  . Arthritis   . Myoclonic jerking   . Right inguinal hernia 03/17/2011  . Umbilical hernia 03/17/2011  . Acute pulmonary embolus (HCC) 11/07/2009  . DVT of lower extremity (deep venous thrombosis) (HCC) 11/07/2009    Past Medical History:  Diagnosis Date  . Acute pulmonary embolus (HCC) 11/07/2009  . Arthritis    neck - limited range of motion; back, shoulders  . Chronic back pain   . Difficult intubation   . DVT of lower extremity (deep venous thrombosis) (HCC) 11/07/2009  . Occasional tremors 03/2017   HANDS   . Right inguinal hernia 02/2011  . Seizures (HCC)   . Syncope and collapse 03/26/2017   . Umbilical hernia 02/2011    Past Surgical History:  Procedure Laterality Date  . HERNIA REPAIR    . INGUINAL HERNIA REPAIR  03/17/2011   Procedure: HERNIA REPAIR INGUINAL ADULT;  Surgeon: Ernestene MentionHaywood M Ingram, MD;  Location: [facility];  Service: General;  Laterality: Right;  right inguinal hernia repair with mesh  . KNEE SURGERY  1977   left  . UMBILICAL HERNIA REPAIR  03/17/2011   Procedure: HERNIA REPAIR UMBILICAL ADULT;  Surgeon: Ernestene MentionHaywood M Ingram, MD;  Location: [facility];  Service: General;  Laterality: N/A;  umbilical hernia repair with mesh    Social History   Socioeconomic History  . Marital status: Married    Spouse name: Cory Johnson  . Number of children: 5  . Years of education: college  . Highest education level: Not on file  Social Needs  . Financial resource strain: Not on file  . Food insecurity - worry: Not on file  . Food insecurity - inability: Not on file  . Transportation needs - medical: Not on file  . Transportation needs - non-medical: Not on file  Occupational History  . Occupation: DIRECTOR    Employer: [employer]  Tobacco Use  . Smoking status: Never Smoker  . Smokeless tobacco: Never Used  Substance and Sexual Activity  . Alcohol use: No  . Drug use: No  . Sexual activity: Yes  Birth control/protection: Surgical  Other Topics Concern  . Not on file  Social History Narrative   Patient lives at home with his wife Cory Johnson).   Disabled.   Education college.   Right handed.   Caffeine one cup of coffee daily.    Family History  Problem Relation Age of Onset  . Hypertension Other      Review of Systems  Constitutional: Negative.  Negative for chills and fever.  HENT: Negative for sore throat.   Respiratory: Negative for shortness of breath.   Cardiovascular: Negative for chest pain and palpitations.  Gastrointestinal: Negative for nausea and vomiting.  Skin: Negative for rash.  Neurological:  Positive for tremors (chronic). Negative for dizziness, loss of consciousness and headaches.    Vitals:   05/05/17 1046  BP: 130/85  Pulse: 72  Resp: 16  Temp: 97.7 F (36.5 C)    Physical Exam  Constitutional: He is oriented to person, place, and time. He appears well-developed and well-nourished.  HENT:  Head: Normocephalic and atraumatic.  Eyes: EOM are normal. Pupils are equal, round, and reactive to light.  Neck:  Chronic neck stiffness.  There is very limited range of motion.  Cardiovascular: Normal rate and regular rhythm.  Pulmonary/Chest: Effort normal and breath sounds normal.  Abdominal: There is no tenderness.  Neurological: He is alert and oriented to person, place, and time.  Positive tremor mostly right arm  Skin: Skin is warm and dry. Capillary refill takes less than 2 seconds.  Psychiatric: He has a normal mood and affect. His behavior is normal.  Vitals reviewed.  A total of 25 minutes was spent in the room with the patient, greater than 50% of which was in counseling/coordination of care.   ASSESSMENT & PLAN: [name] was seen today for hypertension.  Diagnoses and all orders for this visit:  Transient hypotension Comments: resolved  Follow-up with PCP as scheduled.    Edwina Barth, MD [facility]

## 2017-05-08 ENCOUNTER — Ambulatory Visit: Payer: 59 | Admitting: Neurology

## 2017-05-08 ENCOUNTER — Encounter: Payer: Self-pay | Admitting: Neurology

## 2017-05-08 VITALS — Ht 68.0 in | Wt 187.5 lb

## 2017-05-08 DIAGNOSIS — R269 Unspecified abnormalities of gait and mobility: Secondary | ICD-10-CM

## 2017-05-08 DIAGNOSIS — G3184 Mild cognitive impairment, so stated: Secondary | ICD-10-CM

## 2017-05-08 DIAGNOSIS — R251 Tremor, unspecified: Secondary | ICD-10-CM | POA: Diagnosis not present

## 2017-05-08 MED ORDER — PROPRANOLOL HCL 40 MG PO TABS: 40.0000 mg | ORAL_TABLET | Freq: Two times a day (BID) | ORAL | 11 refills | Status: DC

## 2017-05-08 NOTE — Progress Notes (Signed)
PATIENT: Cory Johnson DOB: 05-06-1955  Chief Complaint  Patient presents with  . Follow-up    PCP: Dr. [name]  . Memory Loss    MMSE: 25/30  . Tremors    Tremor in his right hand/arm is very severe     HISTORICAL  Cory Johnson is a 62 years old right-handed male, accompanied by his wife to follow-up for memory loss, history of REM sleep disorder,  He had a history of cervical fusion, due to advanced degenerative disc disease, presenting with progressive anterocollis, severe limited range of motion of his neck muscles  I saw him previously for  frequent nocturnal jerking movements.  He described that he had frequent transient jerking movements of his body while he was falling into sleep, he was able to quickly drifting back asleep again.   He also also has excessive movement  during the night sleep, kicking, moving his limbs, occasionally talking, cause a lot of complains from his wife.  he often dreamed of playing basketball.   He denied excessive snoring, or stop breathing during the the snoring.  He also complain excessive daytime fatigue, especially during the evening time, he would easily doze off sitting at the  meetings or watching TV.    He had sleep study in October 2013, which indeed demonstrate REM sleep disorder. There is no sleep apnea.  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 CT scan of the brain was normal. He continued to complains of neck pain, REM sleep disorder, frequent awakening during nighttime because of the neck pain, he is exercising regularly  Update June 29 2016, Last clinical visit was with Hoyle Sauer on June 09 2016, he noticed worsening memory loss, he had sudden worsening right hand tremor since March 2018, only mild left hand tremor, he denies significant gait abnormality, he does notice mild difficulties with smell, he still acts out of dreams, he is no longer taking clonazepam, complains of nauseous with clonazepam, he feels coldness in his fingers and toes,   He has lost weight in past few months, is 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,  He fell broke his left humeral in May 2018, did not have surgery,  We reviewed laboratory evaluation in April 2018, normal thyroid functional tests, CPK, ESR, B12, RPR, with mild elevated C reactive protein 14.2,  Continue complains of worsening bilateral hands tremor right worse than left, dragging his foot across the floor, chronic constipation, but is on chronic narcotics treatment for his low back pain, neck pain, continue have REM sleep disorder,  poor sleep, poor sense of smell, denied dizziness when getting up quickly,  UPDATE May 08 2017: He has significant bilateral hands tremor, right worse than left, but denies loss sense of smell, continue to have mild memory loss, mild gait abnormality due to his neck flexion.  REVIEW OF SYSTEMS: Full 14 system review of systems performed and notable only for chills, unexpected  weight change, hearing loss, runny nose, drooling, eye redness, constipation, diarrhea, incontinence of bladder, frequent urination, urgency, back pain, walking difficulty, neck pain, neck stiffness, dizziness, numbness, tremor, passing out, agitation, confusion  ALLERGIES: No Known Allergies  HOME MEDICATIONS: Current Outpatient Medications  Medication Sig Dispense Refill  . HYDROcodone-acetaminophen (NORCO) 10-325 MG tablet Take 1 tablet by mouth every 6 (six) hours as needed (for pain).     No current facility-administered medications for this visit.     PAST MEDICAL HISTORY: Past Medical History:  Diagnosis Date  . Acute pulmonary embolus ([place]) 11/07/2009  . Arthritis    neck - limited range of motion; back, shoulders  . Chronic back pain   . Difficult intubation   . DVT of lower extremity (deep venous thrombosis) ([place]) 11/07/2009  . Occasional tremors 03/2017   HANDS   . Right inguinal hernia 02/2011  . Seizures ([place])   . Syncope and collapse 03/26/2017  . Umbilical hernia 00/7622    PAST SURGICAL HISTORY: Past Surgical History:  Procedure Laterality Date  . HERNIA REPAIR    . INGUINAL HERNIA REPAIR  03/17/2011   Procedure: HERNIA REPAIR INGUINAL ADULT;  Surgeon: Adin Hector, MD;  Location: [place];  Service: General;  Laterality: Right;  right inguinal hernia repair with mesh  . [place]   left  . UMBILICAL HERNIA REPAIR  03/17/2011   Procedure: HERNIA REPAIR UMBILICAL ADULT;  Surgeon: Adin Hector, MD;  Location: [place];  Service: General;  Laterality: N/A;  umbilical hernia repair with mesh    FAMILY HISTORY: Family History  Problem Relation Age of Onset  . Hypertension Other     SOCIAL HISTORY:  Social History   Socioeconomic History  . Marital status: Married    Spouse name: [name]  . Number of children: 5  . Years of education: college  . Highest education level: Not on file  Social Needs  .  Financial resource strain: Not on file  . Food insecurity - worry: Not on file  . Food insecurity - inability: Not on file  . Transportation needs - medical: Not on file  . Transportation needs - non-medical: Not on file  Occupational History  . Occupation: DIRECTOR    Employer: [employer]  Tobacco Use  . Smoking status: Never Smoker  . Smokeless tobacco: Never Used  Substance and Sexual Activity  . Alcohol use: No  . Drug use: No  . Sexual activity: Yes    Birth control/protection: Surgical  Other Topics Concern  . Not on file  Social History Narrative   Patient lives at home with his wife [name]).   Disabled.   Education college.   Right handed.   Caffeine one cup of coffee daily.     PHYSICAL EXAM   Vitals:   05/08/17 0849  Weight: 187 lb 8 oz (85 kg)  Height: 5' 8"  (1.727 m)    Not recorded      Body mass index is 28.51 kg/m.  PHYSICAL EXAMNIATION:  Gen: NAD, conversant, well nourised, obese, well groomed  Cardiovascular: Regular rate rhythm, no peripheral edema, warm, nontender. Eyes: Conjunctivae clear without exudates or hemorrhage Neck: Supple, no carotid bruits. Pulmonary: Clear to auscultation bilaterally   NEUROLOGICAL EXAM:  MENTAL STATUS: animal naming 9 MMSE - Mini Mental State Exam 05/08/2017 06/29/2016 06/09/2016  Orientation to time 5 5 4   Orientation to Place 5 5 5   Registration 3 3 3   Attention/ Calculation 3 5 5   Recall 1 1 2   Language- name 2 objects 2 2 2   Language- repeat 0 1 1  Language- follow 3 step command 3 3 3   Language- read & follow direction 1 1 1   Write a sentence 1 1 1   Copy design 1 1 1   Total score 25 28 28      CRANIAL NERVES: CN II: Visual fields are full to confrontation. Fundoscopic exam is normal with sharp discs and no vascular changes. Pupils are round equal and briskly reactive to light. CN III, IV, VI: extraocular movement are normal. No ptosis. CN V: Facial sensation is  intact to pinprick in all 3 divisions bilaterally. Corneal responses are intact.  CN VII: Face is symmetric with normal eye closure and smile. CN VIII: Hearing is normal to rubbing fingers CN IX, X: Palate elevates symmetrically. Phonation is normal. CN XI: Head turning and shoulder shrug are intact CN XII: Tongue is midline with normal movements and no atrophy.  MOTOR: He has bilateral hands posturing tremor, right worse than left, there is no significant rigidity or bradykinesia, he has limited range of motion of his neck muscles.  REFLEXES: Reflexes are 2+ and symmetric at the biceps, triceps, knees, and ankles. Plantar responses are flexor.  SENSORY: Intact to light touch, pinprick, positional sensation and vibratory sensation are intact in fingers and toes.  COORDINATION: Rapid alternating movements and fine finger movements are intact. There is no dysmetria on finger-to-nose and heel-knee-shin.    GAIT/STANCE: Neck flexion, steady gait  DIAGNOSTIC DATA (LABS, IMAGING, TESTING) - I reviewed patient records, labs, notes, testing and imaging myself where available.   ASSESSMENT AND PLAN  URHO RIO is a 62 y.o. male   Long history of REM sleep disorder Fused cervical spine limited range of motion of cervical spine with fixed anterocollis Worsening right hand tremor  Mostly postural component, there was no significant parkinsonian features,  He has a short trial of Sinemet, which did not help his symptoms  DATSCAN was ordered, but patient canceled it  Add on inderal 68m bid.   Worsening memory loss  Laboratory evaluations in January 2019 showed normal TSH, CMP, CBC with mildly decreased 12.2   I personally CT head without contrast in November 2017, generalized atrophy no acute abnormality, he could not tolerate MRI due to his fixed neck flexion, could not be positioning to MRI machine  YMarcial Pacas M.D. Ph.D.  [facility] [address] [place] [address]Ph: [phone]: [phone] CC: Referring Provider

## 2017-05-09 DIAGNOSIS — M791 Myalgia, unspecified site: Secondary | ICD-10-CM | POA: Diagnosis not present

## 2017-05-21 DIAGNOSIS — G894 Chronic pain syndrome: Secondary | ICD-10-CM | POA: Insufficient documentation

## 2017-05-21 DIAGNOSIS — M5030 Other cervical disc degeneration, unspecified cervical region: Secondary | ICD-10-CM | POA: Diagnosis not present

## 2017-05-21 DIAGNOSIS — M5136 Other intervertebral disc degeneration, lumbar region: Secondary | ICD-10-CM | POA: Diagnosis not present

## 2017-05-22 DIAGNOSIS — R42 Dizziness and giddiness: Secondary | ICD-10-CM | POA: Diagnosis not present

## 2017-05-22 DIAGNOSIS — I951 Orthostatic hypotension: Secondary | ICD-10-CM | POA: Diagnosis not present

## 2017-05-22 DIAGNOSIS — R079 Chest pain, unspecified: Secondary | ICD-10-CM | POA: Diagnosis not present

## 2017-05-24 ENCOUNTER — Ambulatory Visit (INDEPENDENT_AMBULATORY_CARE_PROVIDER_SITE_OTHER): Payer: 59 | Admitting: Family Medicine

## 2017-05-24 ENCOUNTER — Other Ambulatory Visit: Payer: Self-pay

## 2017-05-24 ENCOUNTER — Encounter: Payer: Self-pay | Admitting: Family Medicine

## 2017-05-24 VITALS — BP 110/70 | HR 52 | Temp 98.5°F | Resp 16 | Ht 68.0 in | Wt 182.8 lb

## 2017-05-24 DIAGNOSIS — E46 Unspecified protein-calorie malnutrition: Secondary | ICD-10-CM | POA: Diagnosis not present

## 2017-05-24 DIAGNOSIS — Z125 Encounter for screening for malignant neoplasm of prostate: Secondary | ICD-10-CM

## 2017-05-24 DIAGNOSIS — R490 Dysphonia: Secondary | ICD-10-CM

## 2017-05-24 DIAGNOSIS — Z13220 Encounter for screening for lipoid disorders: Secondary | ICD-10-CM

## 2017-05-24 DIAGNOSIS — Z0000 Encounter for general adult medical examination without abnormal findings: Secondary | ICD-10-CM | POA: Diagnosis not present

## 2017-05-24 DIAGNOSIS — Z23 Encounter for immunization: Secondary | ICD-10-CM

## 2017-05-24 DIAGNOSIS — R251 Tremor, unspecified: Secondary | ICD-10-CM

## 2017-05-24 DIAGNOSIS — Z800 Family history of malignant neoplasm of digestive organs: Secondary | ICD-10-CM

## 2017-05-24 DIAGNOSIS — G894 Chronic pain syndrome: Secondary | ICD-10-CM | POA: Diagnosis not present

## 2017-05-24 NOTE — Patient Instructions (Addendum)
I'm glad to hear that the propanolol is helping the tremor. Heart rate is a little on the low side today. If any lightheadness or dizziness, then be seen here or in the ER if needed.   Continue follow up with [name], including for your ongoing back pain.  If any further fatigue or feeling of lightheadedness I would recommend cutting back on the narcotic pain medicine - this can be discussed with [name]. Return to the clinic or go to the nearest emergency room if any of your symptoms worsen or new symptoms occur.  I would like you to meet with Ear Nose and throat to determine next step for your hoarseness, especially with family history of throat cancer.   I will recheck your electrolytes and protein stores. Ok to continue Ensure for added nutrition.   Tetanus updated today. Let me know if you change your mind about the shingles vaccine.   CAll your eye care provider for an appointment.   I will look for the notes from the cardiologist. Continue follow up testing as planned.   Please follow up in 4-6 weeks for recheck of weight.  Return to the clinic or go to the nearest emergency room if any of your symptoms worsen or new symptoms occur.   Keeping you healthy  Get these tests  Blood pressure- Have your blood pressure checked once a year by your healthcare provider.  Normal blood pressure is 120/80  Weight- Have your body mass index (BMI) calculated to screen for obesity.  BMI is a measure of body fat based on height and weight. You can also calculate your own BMI at [email or web address].  Cholesterol- Have your cholesterol checked every year.  Diabetes- Have your blood sugar checked regularly if you have high blood pressure, high cholesterol, have a family history of diabetes or if you are overweight.  Screening for Colon Cancer- Colonoscopy starting at age 64.  Screening may begin sooner depending on your family history and other health conditions. Follow up colonoscopy as  directed by your Gastroenterologist.  Screening for Prostate Cancer- Both blood work (PSA) and a rectal exam help screen for Prostate Cancer.  Screening begins at age 68 with African-American men and at age 55 with Caucasian men.  Screening may begin sooner depending on your family history.  Take these medicines  Aspirin- One aspirin daily can help prevent Heart disease and Stroke.  Flu shot- Every fall.  Tetanus- Every 10 years.  Zostavax- Once after the age of 44 to prevent Shingles.  Pneumonia shot- Once after the age of 21; if you are younger than 55, ask your healthcare provider if you need a Pneumonia shot.  Take these steps  Don't smoke- If you do smoke, talk to your doctor about quitting.  For tips on how to quit, go to www.smokefree.gov or call 1-800-QUIT-NOW.  Be physically active- Exercise 5 days a week for at least 30 minutes.  If you are not already physically active start slow and gradually work up to 30 minutes of moderate physical activity.  Examples of moderate activity include walking briskly, mowing the yard, dancing, swimming, bicycling, etc.  Eat a healthy diet- Eat a variety of healthy food such as fruits, vegetables, low fat milk, low fat cheese, yogurt, lean meant, poultry, fish, beans, tofu, etc. For more information go to www.thenutritionsource.org  Drink alcohol in moderation- Limit alcohol intake to less than two drinks a day. Never drink and drive.  Dentist- Brush and floss twice daily; visit  your dentist twice a year.  Depression- Your emotional health is as important as your physical health. If you're feeling down, or losing interest in things you would normally enjoy please talk to your healthcare provider.  Eye exam- Visit your eye doctor every year.  Safe sex- If you may be exposed to a sexually transmitted infection, use a condom.  Seat belts- Seat belts can save your life; always wear one.  Smoke/Carbon Monoxide detectors- These detectors need  to be installed on the appropriate level of your home.  Replace batteries at least once a year.  Skin cancer- When out in the sun, cover up and use sunscreen 15 SPF or higher.  Violence- If anyone is threatening you, please tell your healthcare provider.  Living Will/ Health care power of attorney- Speak with your healthcare provider and family.    IF you received an x-ray today, you will receive an invoice from [facility]. Please contact [facility] at [phone] with questions or concerns regarding your invoice.   IF you received labwork today, you will receive an invoice from [facility]. Please contact [facility] at [phone] with questions or concerns regarding your invoice.   Our billing staff will not be able to assist you with questions regarding bills from these companies.  You will be contacted with the lab results as soon as they are available. The fastest way to get your results is to activate your My Chart account. Instructions are located on the last page of this paperwork. If you have not heard from us regarding the results in 2 weeks, please contact this office.

## 2017-05-24 NOTE — Progress Notes (Signed)
Subjective:  By signing my name below, I, Cory Johnson, attest that this documentation has been prepared under the direction and in the presence of Cory Staggers, MD. Electronically Signed: Stann Johnson, Scribe. 05/24/2017 , 11:24 AM .  Patient was seen in Room 10 .   Patient ID: Cory Johnson, male    DOB: [birth year], 62 y.o.   MRN: [id number] Chief Complaint  Patient presents with  . Annual Exam   HPI Cory Johnson is a 61 y.o. male  Here for annual physical. Patient was last seen on Feb 21st for a hospital follow up from syncope and collapse. He has a history of multiple chronic problems including DVT/PE, tremor, peripheral neuropathy. PT and evaluation recommended for home health PT, but that had been declined. At last visit, he had been off of all medications for tremors and HTN medications. His BP was 108/68. He was referred to cardiology for heart arrhythmia and neurology follow up, planned for tremor and medication management.   Some of the his syncope/near-syncope and lightheadedness was concerning for possible side effects from chronic narcotic use, as he was taking hydrocodone 4-5 times per day. Discussed to follow up with his pain management provider to discuss possible changes. He denies having any heartburn.   Cardiology follow up He saw cardiologist, Dr. Jacinto Johnson, on Monday (3 days ago). Plan for stress test.   Malnutrition He was also found to be malnourished during hospitalization with protein of 5.3, albumin 5.7 and weight was 199 lbs. He had not started Ensure but reports eating 3 meals per day. He reports drinking 1 Ensure a day and eating 3 meals a day. He had non reactive HIV on Jan 15th.   Wt Readings from Last 3 Encounters:  05/24/17 182 lb 12.8 oz (82.9 kg)  05/08/17 187 lb 8 oz (85 kg)  05/05/17 185 lb (83.9 kg)    Lipid screening Lab Results  Component Value Date   CHOL 183 12/04/2013   HDL 55 12/04/2013   LDLCALC 115 (H) 12/04/2013   TRIG 65  12/04/2013   CHOLHDL 3.3 12/04/2013    Hoarseness/change in voice Recommended CT of neck to rule out mass, but he planned to discuss with his pain management provider as issues with lying flat due to chronic neck problems. He notes it's been ongoing for about a year. He has family history of throat cancer in 2 great uncles and his grandmother. He mentions having some improvement after addition of humidifier to his bed room. He denies cough or trouble breathing.   Tremor He saw neurology on Feb 26th; tremor believed mostly postural component. Previous trial of Sinemet which didn't help symptoms. DATSCAN ordered but patient cancelled and started on inderal.   Patient notes some improvement with inderal. His heart rate was a little low on triage at 52.   Memory loss  Previous CT Jan 2017, generalized atrophy. Unable to tolerate MRI due to neck issues.   Chronic pain Followed by Dr. Ethelene Johnson, last seen on March 11th. No specific notes available at this time. Patient states no change in medications. He denies dizziness or syncope.   Back pain He mentions intermittent back pain for a few years now. He denies any new recent falls or injury.   Cancer Screening Colonoscopy: last done March 2018 by Dr. [name], which was normal; repeat in 10 years.  Prostate cancer screening: improved previously from 2.32 when checked in 2015.  Lab Results  Component Value Date   PSA 1.0  01/10/2016   PSA 2.32 12/04/2013   PSA 1.43 08/20/2012    Immunizations  There is no immunization history on file for this patient.  Tdap: Unsure when he last had it; agrees to update today.  Shingrix: Declines shingles vaccine today; will defer.    Depression Depression screen [facility] 2/9 05/24/2017 05/05/2017 05/03/2017 12/13/2016 06/22/2016  Decreased Interest 0 0 0 0 0  Down, Depressed, Hopeless 0 0 0 0 0  PHQ - 2 Score 0 0 0 0 0   Falls screening: denies recent falls.   Vision Vision Screening Comments: Pt present in  the office today without glasses so was unable to see the chart for the screening.   Eye doctor: he hasn't seen eye doctor in the last 2-3 years. He has glasses at home.   Dentist Dent last seen within 6-7 months.   Functional Status Survey Is the patient deaf or have difficulty hearing?: No Does the patient have difficulty seeing, even when wearing glasses/contacts?: No Does the patient have difficulty concentrating, remembering, or making decisions?: No Does the patient have difficulty walking or climbing stairs?: No Does the patient have difficulty dressing or bathing?: No Does the patient have difficulty doing errands alone such as visiting a doctor's office or shopping?: No   Patient Active Problem List   Diagnosis Date Noted  . Tremor 05/08/2017  . Gait abnormality 05/08/2017  . Transient hypotension 05/05/2017  . Malnutrition of moderate degree 03/28/2017  . Syncope and collapse 03/26/2017  . Subungual hematoma of great toe of right foot 12/13/2016  . Pain of toe of right foot 12/13/2016  . Paresthesia 06/29/2016  . Tremor of right hand 06/29/2016  . Mild cognitive impairment 06/29/2016  . Tremors of nervous system 06/22/2016  . Drooling 06/22/2016  . Loss of weight 06/22/2016  . Family history of throat cancer 06/22/2016  . Abnormality of gait 06/09/2016  . Acute bronchitis 03/20/2016  . Memory loss 12/31/2013  . Back pain 08/18/2013  . Arthritis   . Myoclonic jerking   . Right inguinal hernia 03/17/2011  . Umbilical hernia 03/17/2011  . Acute pulmonary embolus (HCC) 11/07/2009  . DVT of lower extremity (deep venous thrombosis) (HCC) 11/07/2009   Past Medical History:  Diagnosis Date  . Acute pulmonary embolus (HCC) 11/07/2009  . Arthritis    neck - limited range of motion; back, shoulders  . Chronic back pain   . Difficult intubation   . DVT of lower extremity (deep venous thrombosis) (HCC) 11/07/2009  . Occasional tremors 03/2017   HANDS   . Right inguinal  hernia 02/2011  . Seizures (HCC)   . Syncope and collapse 03/26/2017  . Umbilical hernia 02/2011   Past Surgical History:  Procedure Laterality Date  . HERNIA REPAIR    . INGUINAL HERNIA REPAIR  03/17/2011   Procedure: HERNIA REPAIR INGUINAL ADULT;  Surgeon: Ernestene Mention, MD;  Location: [facility];  Service: General;  Laterality: Right;  right inguinal hernia repair with mesh  . KNEE SURGERY  1977   left  . UMBILICAL HERNIA REPAIR  03/17/2011   Procedure: HERNIA REPAIR UMBILICAL ADULT;  Surgeon: Ernestene Mention, MD;  Location: [facility];  Service: General;  Laterality: N/A;  umbilical hernia repair with mesh   No Known Allergies Prior to Admission medications   Medication Sig Start Date End Date Taking? Authorizing Provider  HYDROcodone-acetaminophen (NORCO) 10-325 MG tablet Take 1 tablet by mouth every 6 (six) hours as needed (for pain).  [provider]  propranolol (INDERAL) 40 MG tablet Take 1 tablet (40 mg total) by mouth 2 (two) times daily. 05/08/17   Levert Feinstein, MD   Social History   Socioeconomic History  . Marital status: Married    Spouse name: [name]  . Number of children: 5  . Years of education: college  . Highest education level: Not on file  Social Needs  . Financial resource strain: Not on file  . Food insecurity - worry: Not on file  . Food insecurity - inability: Not on file  . Transportation needs - medical: Not on file  . Transportation needs - non-medical: Not on file  Occupational History  . Occupation: DIRECTOR    Employer: [employer]  Tobacco Use  . Smoking status: Never Smoker  . Smokeless tobacco: Never Used  Substance and Sexual Activity  . Alcohol use: No  . Drug use: No  . Sexual activity: Yes    Birth control/protection: Surgical  Other Topics Concern  . Not on file  Social History Narrative   Patient lives at home with his wife [name].   Disabled.   Education college.    Right handed.   Caffeine one cup of coffee daily.   Review of Systems 13 point ROS - negative     Objective:   Physical Exam  Constitutional: He is oriented to person, place, and time. He appears well-developed and well-nourished.  HENT:  Head: Normocephalic and atraumatic.  Right Ear: External ear normal.  Left Ear: External ear normal.  Mouth/Throat: Oropharynx is clear and moist.  Eyes: Conjunctivae and EOM are normal. Pupils are equal, round, and reactive to light.  Neck: Normal range of motion. Neck supple. No thyromegaly present.  Cardiovascular: Normal rate, regular rhythm and intact distal pulses. Exam reveals gallop.  Gallop with relatively slow heart rate  Pulmonary/Chest: Effort normal and breath sounds normal. No respiratory distress. He has no wheezes.  Abdominal: Soft. He exhibits no distension. There is no tenderness. Hernia confirmed negative in the right inguinal area and confirmed negative in the left inguinal area.  Genitourinary: Prostate normal.  Musculoskeletal: Normal range of motion. He exhibits no edema or tenderness.  Lymphadenopathy:    He has no cervical adenopathy.  Neurological: He is alert and oriented to person, place, and time. He has normal reflexes.  Resting tremors in hands  Skin: Skin is warm and dry.  Psychiatric: He has a normal mood and affect. His behavior is normal.  Vitals reviewed.   Vitals:   05/24/17 1038  BP: 110/70  Pulse: (!) 52  Resp: 16  Temp: 98.5 F (36.9 C)  TempSrc: Oral  SpO2: 96%  Weight: 182 lb 12.8 oz (82.9 kg)  Height: 5\' 8"  (1.727 m)       Assessment & Plan:   CAYLEN YARDLEY is a 62 y.o. male Annual physical exam  -  - anticipatory guidance as below in AVS, screening labs if needed. Health maintenance items as above in HPI discussed/recommended as applicable.   - no concerning responses on depression, fall, or functional status screening. Any positive responses noted as above. Advanced directives  discussed as in CHL.   Malnutrition, unspecified type (HCC)  - malnutrition noted in hospital, states he has been eating better including ensure supplement. CMP pending to evaluate albumin  - Evaluating level of PSA to rule out malignancy, as well as referring to ENT for hoarseness given family history of throat cancer.  -continue to monitor for trends  with recheck in 4-6 weeks  Tremor  -Improved with beta blocker, tolerating without orthostatic symptoms. Continue routine follow-up with neurology  Chronic pain syndrome  -Followed by pain management. Cautioned on hydrocodone risks.  Screening for prostate cancer - Plan: PSA  -We discussed pros and cons of prostate cancer screening, and after this discussion, he chose to have screening done. PSA obtained, and no concerning findings on DRE.   Hoarseness - Plan: Ambulatory referral to ENT Family history of throat cancer - Plan: Ambulatory referral to ENT  -Due to cervical issues would not be able to lay flat for CT. Will refer to ENT initially to decide next step including possible laryngoscopy.  Screening for hyperlipidemia - Plan: Comprehensive metabolic panel, Lipid panel  Need for diphtheria-tetanus-pertussis (Tdap) vaccine - Plan: Tdap vaccine greater than or equal to 7yo IM   No orders of the defined types were placed in this encounter.  Patient Instructions    I'm glad to hear that the propanolol is helping the tremor. Heart rate is a little on the low side today. If any lightheadness or dizziness, then be seen here or in the ER if needed.   Continue follow up with Dr. Ethelene Johnson, including for your ongoing back pain.  If any further fatigue or feeling of lightheadedness I would recommend cutting back on the narcotic pain medicine - this can be discussed with Dr. Ethelene Johnson. Return to the clinic or go to the nearest emergency room if any of your symptoms worsen or new symptoms occur.  I would like you to meet with Ear Nose and throat to  determine next step for your hoarseness, especially with family history of throat cancer.   I will recheck your electrolytes and protein stores. Ok to continue Ensure for added nutrition.   Tetanus updated today. Let me know if you change your mind about the shingles vaccine.   CAll your eye care provider for an appointment.   I will look for the notes from the cardiologist. Continue follow up testing as planned.   Please follow up in 4-6 weeks for recheck of weight.  Return to the clinic or go to the nearest emergency room if any of your symptoms worsen or new symptoms occur.   Keeping you healthy  Get these tests  Blood pressure- Have your blood pressure checked once a year by your healthcare provider.  Normal blood pressure is 120/80  Weight- Have your body mass index (BMI) calculated to screen for obesity.  BMI is a measure of body fat based on height and weight. You can also calculate your own BMI at [email or web address].  Cholesterol- Have your cholesterol checked every year.  Diabetes- Have your blood sugar checked regularly if you have high blood pressure, high cholesterol, have a family history of diabetes or if you are overweight.  Screening for Colon Cancer- Colonoscopy starting at age 3.  Screening may begin sooner depending on your family history and other health conditions. Follow up colonoscopy as directed by your Gastroenterologist.  Screening for Prostate Cancer- Both blood work (PSA) and a rectal exam help screen for Prostate Cancer.  Screening begins at age 60 with African-American men and at age 85 with Caucasian men.  Screening may begin sooner depending on your family history.  Take these medicines  Aspirin- One aspirin daily can help prevent Heart disease and Stroke.  Flu shot- Every fall.  Tetanus- Every 10 years.  Zostavax- Once after the age of 56 to prevent Shingles.  Pneumonia shot- Once  after the age of 64; if you are younger than 5865, ask your  healthcare provider if you need a Pneumonia shot.  Take these steps  Don't smoke- If you do smoke, talk to your doctor about quitting.  For tips on how to quit, go to www.smokefree.gov or call 1-800-QUIT-NOW.  Be physically active- Exercise 5 days a week for at least 30 minutes.  If you are not already physically active start slow and gradually work up to 30 minutes of moderate physical activity.  Examples of moderate activity include walking briskly, mowing the yard, dancing, swimming, bicycling, etc.  Eat a healthy diet- Eat a variety of healthy food such as fruits, vegetables, low fat milk, low fat cheese, yogurt, lean meant, poultry, fish, beans, tofu, etc. For more information go to www.thenutritionsource.org  Drink alcohol in moderation- Limit alcohol intake to less than two drinks a day. Never drink and drive.  Dentist- Brush and floss twice daily; visit your dentist twice a year.  Depression- Your emotional health is as important as your physical health. If you're feeling down, or losing interest in things you would normally enjoy please talk to your healthcare provider.  Eye exam- Visit your eye doctor every year.  Safe sex- If you may be exposed to a sexually transmitted infection, use a condom.  Seat belts- Seat belts can save your life; always wear one.  Smoke/Carbon Monoxide detectors- These detectors need to be installed on the appropriate level of your home.  Replace batteries at least once a year.  Skin cancer- When out in the sun, cover up and use sunscreen 15 SPF or higher.  Violence- If anyone is threatening you, please tell your healthcare provider.  Living Will/ Health care power of attorney- Speak with your healthcare provider and family.    IF you received an x-ray today, you will receive an invoice from [facility]. Please contact [facility] at [phone] with questions or concerns regarding your invoice.   IF you received labwork today,  you will receive an invoice from [facility]. Please contact [facility] at [phone] with questions or concerns regarding your invoice.   Our billing staff will not be able to assist you with questions regarding bills from these companies.  You will be contacted with the lab results as soon as they are available. The fastest way to get your results is to activate your My Chart account. Instructions are located on the last page of this paperwork. If you have not heard from us regarding the results in 2 weeks, please contact this office.       I personally performed the services described in this documentation, which was scribed in my presence. The recorded information has been reviewed and considered for accuracy and completeness, addended by me as needed, and agree with information above.  Signed,   Cory StaggersJeffrey Avionna Bower, MD [facility] at [facility].  05/26/17 9:07 AM

## 2017-05-25 LAB — COMPREHENSIVE METABOLIC PANEL WITH GFR
ALT: 5 [IU]/L (ref 0–44)
AST: 10 [IU]/L (ref 0–40)
Albumin/Globulin Ratio: 1.3 (ref 1.2–2.2)
Albumin: 4.2 g/dL (ref 3.6–4.8)
Alkaline Phosphatase: 87 [IU]/L (ref 39–117)
BUN/Creatinine Ratio: 12 (ref 10–24)
BUN: 10 mg/dL (ref 8–27)
Bilirubin Total: 0.7 mg/dL (ref 0.0–1.2)
CO2: 29 mmol/L (ref 20–29)
Calcium: 9.4 mg/dL (ref 8.6–10.2)
Chloride: 98 mmol/L (ref 96–106)
Creatinine, Ser: 0.81 mg/dL (ref 0.76–1.27)
GFR calc Af Amer: 109 mL/min/{1.73_m2}
GFR calc non Af Amer: 95 mL/min/{1.73_m2}
Globulin, Total: 3.3 g/dL (ref 1.5–4.5)
Glucose: 89 mg/dL (ref 65–99)
Potassium: 4.3 mmol/L (ref 3.5–5.2)
Sodium: 141 mmol/L (ref 134–144)
Total Protein: 7.5 g/dL (ref 6.0–8.5)

## 2017-05-25 LAB — LIPID PANEL
Chol/HDL Ratio: 2.6 ratio (ref 0.0–5.0)
Cholesterol, Total: 160 mg/dL (ref 100–199)
HDL: 62 mg/dL (ref >39)
LDL Calculated: 86 mg/dL (ref 0–99)
Triglycerides: 60 mg/dL (ref 0–149)
VLDL Cholesterol Cal: 12 mg/dL (ref 5–40)

## 2017-05-25 LAB — PSA: Prostate Specific Ag, Serum: 6.9 ng/mL — ABNORMAL HIGH (ref 0.0–4.0)

## 2017-06-04 ENCOUNTER — Other Ambulatory Visit: Payer: Self-pay | Admitting: Family Medicine

## 2017-06-04 DIAGNOSIS — R972 Elevated prostate specific antigen [PSA]: Secondary | ICD-10-CM

## 2017-06-04 DIAGNOSIS — R0789 Other chest pain: Secondary | ICD-10-CM | POA: Diagnosis not present

## 2017-06-04 DIAGNOSIS — R634 Abnormal weight loss: Secondary | ICD-10-CM

## 2017-06-06 DIAGNOSIS — M791 Myalgia, unspecified site: Secondary | ICD-10-CM | POA: Diagnosis not present

## 2017-06-21 ENCOUNTER — Other Ambulatory Visit: Payer: Self-pay

## 2017-06-21 ENCOUNTER — Ambulatory Visit (INDEPENDENT_AMBULATORY_CARE_PROVIDER_SITE_OTHER): Payer: 59 | Admitting: Family Medicine

## 2017-06-21 ENCOUNTER — Encounter: Payer: Self-pay | Admitting: Family Medicine

## 2017-06-21 VITALS — BP 136/70 | HR 65 | Temp 98.2°F | Ht 73.0 in | Wt 185.0 lb

## 2017-06-21 DIAGNOSIS — R499 Unspecified voice and resonance disorder: Secondary | ICD-10-CM

## 2017-06-21 DIAGNOSIS — R972 Elevated prostate specific antigen [PSA]: Secondary | ICD-10-CM | POA: Diagnosis not present

## 2017-06-21 DIAGNOSIS — R634 Abnormal weight loss: Secondary | ICD-10-CM | POA: Diagnosis not present

## 2017-06-21 NOTE — Patient Instructions (Addendum)
  Keep follow-up as planned with ear nose and throat doctor to discuss the hoarseness of voice.  If you are having worsening drooling prior to that time, I am happy to see you.  Also follow-up with urologist to discuss the elevated PSA.  Continue meals throughout the day, Ensure supplement as needed and recheck with me in the next 3 months for repeat weight evaluation.  Follow with me sooner if any new or worsening symptoms.   IF you received an x-ray today, you will receive an invoice from [facility]. Please contact [facility] at [phone] with questions or concerns regarding your invoice.   IF you received labwork today, you will receive an invoice from [facility]. Please contact [facility] at [phone] with questions or concerns regarding your invoice.   Our billing staff will not be able to assist you with questions regarding bills from these companies.  You will be contacted with the lab results as soon as they are available. The fastest way to get your results is to activate your My Chart account. Instructions are located on the last page of this paperwork. If you have not heard from us regarding the results in 2 weeks, please contact this office.

## 2017-06-21 NOTE — Progress Notes (Signed)
Subjective:  By signing my name below, I, Cory Johnson, attest that this documentation has been prepared under the direction and in the presence of Cory StaggersJeffrey Laveda Demedeiros, MD. Electronically Signed: Stann Oresung-Kai Johnson, Scribe. 06/21/2017 , 11:46 AM .  Patient was seen in Room 12 .   Patient ID: Cory Johnson, male    DOB: 1956/01/14, 62 y.o.   MRN: [id number] Chief Complaint  Patient presents with  . Results    since last visit and weight check    HPI Cory Johnson is a 62 y.o. male  Patient was last seen on March 14th for a physical, as well as multiple other concerns.   He was brought in by his son today.   Malnutrition See prior notes. He had a low protein of 5.3 and albumin of 2.7 in the hospital. He was evaluated by nutritionist, and was recommended Ensure supplements. As of last visit, he was eating 3 meals a day with 1 Ensure supplement. His albumin had increased to 3.9 at least visit.   Wt Readings from Last 3 Encounters:  06/21/17 185 lb (83.9 kg)  05/24/17 182 lb 12.8 oz (82.9 kg)  05/08/17 187 lb 8 oz (85 kg)   His weight has been stable around 185. He is still eating 3 meals a day with up to 2 Ensure supplements a day.   Elevated PSA  He had PSA of 6.9 on March 14th, up from 1.0 when checked in Oct 2017. Referred to urology, sent in March 29th.   He has had increased urinary frequency that started a few years ago. He denies any fever, abdominal pain or dysuria.   Hoarseness/voice change Discussed CT scan to rule out neck mass, as he has family history of throat cancer in great uncles and grandmother. But did not order CT due to difficulty laying flat with neck problems. Followed by pain management. He was referred to ENT to decide on next step.   He believes ENT appointment is scheduled on April 25th. He's not using humidifier in his bedroom right now. He believes his hoarseness has improved.   Slobbering/drooling He mentions he's noticed some slobbering and  drooling lately. He reports he's been able to eat foods and drink liquids okay without choking.   Patient Active Problem List   Diagnosis Date Noted  . Tremor 05/08/2017  . Gait abnormality 05/08/2017  . Transient hypotension 05/05/2017  . Malnutrition of moderate degree 03/28/2017  . Syncope and collapse 03/26/2017  . Subungual hematoma of great toe of right foot 12/13/2016  . Pain of toe of right foot 12/13/2016  . Paresthesia 06/29/2016  . Tremor of right hand 06/29/2016  . Mild cognitive impairment 06/29/2016  . Tremors of nervous system 06/22/2016  . Drooling 06/22/2016  . Loss of weight 06/22/2016  . Family history of throat cancer 06/22/2016  . Abnormality of gait 06/09/2016  . Acute bronchitis 03/20/2016  . Memory loss 12/31/2013  . Back pain 08/18/2013  . Arthritis   . Myoclonic jerking   . Right inguinal hernia 03/17/2011  . Umbilical hernia 03/17/2011  . Acute pulmonary embolus (HCC) 11/07/2009  . DVT of lower extremity (deep venous thrombosis) (HCC) 11/07/2009   Past Medical History:  Diagnosis Date  . Acute pulmonary embolus (HCC) 11/07/2009  . Arthritis    neck - limited range of motion; back, shoulders  . Chronic back pain   . Difficult intubation   . DVT of lower extremity (deep venous thrombosis) (HCC) 11/07/2009  . Occasional  tremors 03/2017   HANDS   . Right inguinal hernia 02/2011  . Seizures (HCC)   . Syncope and collapse 03/26/2017  . Umbilical hernia 02/2011   Past Surgical History:  Procedure Laterality Date  . HERNIA REPAIR    . INGUINAL HERNIA REPAIR  03/17/2011   Procedure: HERNIA REPAIR INGUINAL ADULT;  Surgeon: Ernestene Mention, MD;  Location: [facility];  Service: General;  Laterality: Right;  right inguinal hernia repair with mesh  . KNEE SURGERY  1977   left  . UMBILICAL HERNIA REPAIR  03/17/2011   Procedure: HERNIA REPAIR UMBILICAL ADULT;  Surgeon: Ernestene Mention, MD;  Location: [facility];  Service:  General;  Laterality: N/A;  umbilical hernia repair with mesh   No Known Allergies Prior to Admission medications   Medication Sig Start Date End Date Taking? Authorizing Provider  HYDROcodone-acetaminophen (NORCO) 10-325 MG tablet Take 1 tablet by mouth every 6 (six) hours as needed (for pain).   Yes [provider]  lidocaine (XYLOCAINE) 5 % ointment lidocaine 5 % topical ointment   Yes [provider]  propranolol (INDERAL) 40 MG tablet propranolol 40 mg tablet   Yes [provider]   Social History   Socioeconomic History  . Marital status: Married    Spouse name: [name]  . Number of children: 5  . Years of education: college  . Highest education level: Not on file  Occupational History  . Occupation: DIRECTOR    Employer: [employer]  Social Needs  . Financial resource strain: Not on file  . Food insecurity:    Worry: Not on file    Inability: Not on file  . Transportation needs:    Medical: Not on file    Non-medical: Not on file  Tobacco Use  . Smoking status: Never Smoker  . Smokeless tobacco: Never Used  Substance and Sexual Activity  . Alcohol use: No  . Drug use: No  . Sexual activity: Yes    Birth control/protection: Surgical  Lifestyle  . Physical activity:    Days per week: Not on file    Minutes per session: Not on file  . Stress: Not on file  Relationships  . Social connections:    Talks on phone: Not on file    Gets together: Not on file    Attends religious service: Not on file    Active member of club or organization: Not on file    Attends meetings of clubs or organizations: Not on file    Relationship status: Not on file  . Intimate partner violence:    Fear of current or ex partner: Not on file    Emotionally abused: Not on file    Physically abused: Not on file    Forced sexual activity: Not on file  Other Topics Concern  . Not on file  Social History Narrative   Patient lives at home with his  wife Cory Johnson).   Disabled.   Education college.   Right handed.   Caffeine one cup of coffee daily.   Review of Systems  Constitutional: Negative for appetite change, fatigue, fever and unexpected weight change.  HENT: Positive for voice change.   Eyes: Negative for visual disturbance.  Respiratory: Negative for cough, chest tightness and shortness of breath.   Cardiovascular: Negative for chest pain, palpitations and leg swelling.  Gastrointestinal: Negative for abdominal pain and blood in stool.  Genitourinary: Positive for frequency. Negative for dysuria.  Neurological:  Negative for dizziness, light-headedness and headaches.       Objective:   Physical Exam  Constitutional: He is oriented to person, place, and time. He appears well-developed and well-nourished. No distress.  HENT:  Head: Normocephalic and atraumatic.  Mouth/Throat: Oropharynx is clear and moist.  Eyes: Pupils are equal, round, and reactive to light. EOM are normal.  Neck: Neck supple.  Cardiovascular: Normal rate.  Pulmonary/Chest: Effort normal. No respiratory distress.  Abdominal: Soft. He exhibits no distension. There is no tenderness (including suprapubic).  Musculoskeletal: Normal range of motion.  Neurological: He is alert and oriented to person, place, and time.  Skin: Skin is warm and dry.  Psychiatric: He has a normal mood and affect. His behavior is normal.  Nursing note and vitals reviewed.   Vitals:   06/21/17 1047  BP: 136/70  Pulse: 65  Temp: 98.2 F (36.8 C)  TempSrc: Oral  SpO2: 100%  Weight: 185 lb (83.9 kg)  Height: 6\' 1"  (1.854 m)       Assessment & Plan:  DURON MEISTER is a 62 y.o. male Loss of weight  -Weight appears to be stabilized with slight increase since last visit.  Albumin was improved last visit.  Continue 3 meals per day, Ensure supplement as needed.  Has follow-up planned with urology and ENT to look into other causes for weight loss as below.  Recheck 3  months  Elevated PSA  -Increased from 2017.  Does admit to some increased frequency over the past few years.  BPH possible, but with rising PSA from 1.0-6.9 I do want him to follow-up with urology to determine further testing/workup.  No new medications started at this time.  Urology appointment pending.  RTC precautions if any worsening dysuria,  frequency or abdominal pain.   Hoarseness or changing voice  -Reports overall improvement, but with weight loss and family history of laryngeal cancer, still would like him to see ENT.  Appointment pending.  Difficult for imaging given neck issues and difficulty lying flat.  Can decide with ENT if laryngoscopy or other imaging needed.  No orders of the defined types were placed in this encounter.  Patient Instructions    Keep follow-up as planned with ear nose and throat doctor to discuss the hoarseness of voice.  If you are having worsening drooling prior to that time, I am happy to see you.  Also follow-up with urologist to discuss the elevated PSA.  Continue meals throughout the day, Ensure supplement as needed and recheck with me in the next 3 months for repeat weight evaluation.  Follow with me sooner if any new or worsening symptoms.   IF you received an x-ray today, you will receive an invoice from [facility]. Please contact [facility] at [phone] with questions or concerns regarding your invoice.   IF you received labwork today, you will receive an invoice from [facility]. Please contact [facility] at [phone] with questions or concerns regarding your invoice.   Our billing staff will not be able to assist you with questions regarding bills from these companies.  You will be contacted with the lab results as soon as they are available. The fastest way to get your results is to activate your My Chart account. Instructions are located on the last page of this paperwork. If you have not heard from [place] regarding the results  in 2 weeks, please contact this office.       I personally performed the services described in this documentation, which  was scribed in my presence. The recorded information has been reviewed and considered for accuracy and completeness, addended by me as needed, and agree with information above.  Signed,   Cory Staggers, MD Primary Care at [facility].  06/21/17 12:44 PM

## 2017-06-25 DIAGNOSIS — J040 Acute laryngitis: Secondary | ICD-10-CM | POA: Diagnosis not present

## 2017-06-25 DIAGNOSIS — R1312 Dysphagia, oropharyngeal phase: Secondary | ICD-10-CM | POA: Diagnosis not present

## 2017-06-25 DIAGNOSIS — J322 Chronic ethmoidal sinusitis: Secondary | ICD-10-CM | POA: Diagnosis not present

## 2017-06-25 DIAGNOSIS — J320 Chronic maxillary sinusitis: Secondary | ICD-10-CM | POA: Diagnosis not present

## 2017-06-25 DIAGNOSIS — H6122 Impacted cerumen, left ear: Secondary | ICD-10-CM | POA: Diagnosis not present

## 2017-06-26 DIAGNOSIS — I951 Orthostatic hypotension: Secondary | ICD-10-CM | POA: Diagnosis not present

## 2017-06-26 DIAGNOSIS — R42 Dizziness and giddiness: Secondary | ICD-10-CM | POA: Diagnosis not present

## 2017-06-26 DIAGNOSIS — R079 Chest pain, unspecified: Secondary | ICD-10-CM | POA: Diagnosis not present

## 2017-07-07 DIAGNOSIS — M791 Myalgia, unspecified site: Secondary | ICD-10-CM | POA: Diagnosis not present

## 2017-07-10 DIAGNOSIS — N400 Enlarged prostate without lower urinary tract symptoms: Secondary | ICD-10-CM | POA: Diagnosis not present

## 2017-07-10 DIAGNOSIS — R972 Elevated prostate specific antigen [PSA]: Secondary | ICD-10-CM | POA: Diagnosis not present

## 2017-07-18 DIAGNOSIS — H6122 Impacted cerumen, left ear: Secondary | ICD-10-CM | POA: Diagnosis not present

## 2017-07-19 ENCOUNTER — Ambulatory Visit: Payer: 59 | Admitting: Family Medicine

## 2017-07-30 ENCOUNTER — Ambulatory Visit: Payer: 59 | Admitting: Podiatry

## 2017-08-06 DIAGNOSIS — M791 Myalgia, unspecified site: Secondary | ICD-10-CM | POA: Diagnosis not present

## 2017-08-30 ENCOUNTER — Ambulatory Visit: Payer: 59 | Admitting: Family Medicine

## 2017-08-30 ENCOUNTER — Other Ambulatory Visit: Payer: Self-pay

## 2017-08-30 ENCOUNTER — Encounter: Payer: Self-pay | Admitting: Family Medicine

## 2017-08-30 VITALS — BP 110/72 | HR 59 | Temp 98.3°F | Ht 72.0 in | Wt 180.8 lb

## 2017-08-30 DIAGNOSIS — R634 Abnormal weight loss: Secondary | ICD-10-CM | POA: Diagnosis not present

## 2017-08-30 DIAGNOSIS — R972 Elevated prostate specific antigen [PSA]: Secondary | ICD-10-CM

## 2017-08-30 DIAGNOSIS — E46 Unspecified protein-calorie malnutrition: Secondary | ICD-10-CM | POA: Diagnosis not present

## 2017-08-30 DIAGNOSIS — J320 Chronic maxillary sinusitis: Secondary | ICD-10-CM | POA: Diagnosis not present

## 2017-08-30 DIAGNOSIS — J322 Chronic ethmoidal sinusitis: Secondary | ICD-10-CM | POA: Diagnosis not present

## 2017-08-30 DIAGNOSIS — J040 Acute laryngitis: Secondary | ICD-10-CM | POA: Diagnosis not present

## 2017-08-30 NOTE — Progress Notes (Signed)
Subjective:  By signing my name below, I, Cory Johnson, attest that this documentation has been prepared under the direction and in the presence of Cory Ray, MD. Electronically Signed: [name], [place]. 08/30/2017 , 12:20 PM .  Patient was seen in Room 10 .   Patient ID: Cory Johnson, male    DOB: 18-Jul-1955, 62 y.o.   MRN: [id number] Chief Complaint  Patient presents with  . unexplained weight loss    lost 5 lb since april (LOV)   HPI [name] is a 62 y.o. male  Here for follow up. Last seen on April 11th for weight loss and suspected malnutrition.   Weight Loss and malnutrition Patient was previously evaluated by nutritionist in the hospital with a low protein of 5.3 and albumin of 2.7 on Jan 15th. Ensure supplements were recommended. His weight had increased from March to April from 182 to 185. He informed eating 3 meals a day with 2 Ensure supplements a day. Of note, he also had a normal chest xray with clear lungs in Jan. His most recent protein was 7.5 and albumin up to 4.2. He had mildly elevated CRP in April 2018. He has had history of low back pain for a while, treated by Dr. [name].   Wt Readings from Last 3 Encounters:  08/30/17 180 lb 12.8 oz (82 kg)  06/21/17 185 lb (83.9 kg)  05/24/17 182 lb 12.8 oz (82.9 kg)   Patient states he's still eating 3 full meals a day, with 2 Ensure supplements as well. His son, however, informs patient is usually really active at home including yard work. His son also mentions the patient isn't completing his meals; for example, if his meal is a sandwich, the patient would only eat half a sandwich. His son believes he's burning off all the calories with home activity and with the tremors. Patient denies any night sweats.   Elevated PSA He did have an elevated PSA and was referred to urology. He met with urology, Dr. [name], on 07/10/17. BPH with elevated PSA, plan on prostate biopsy; estimate size approximately 50  grams.   Apparently, the prostate biopsy was held off as his PSA had improved.   Hoarseness of voice He also had hoarseness in his voice, and with family history of throat cancer, he was referred to ENT at last visit. He was seen by Dr. [name] on May 8th with ENT. He had a clear nasopharynx, true cords, false cords, epiglottis and base of tongue looked okay. He also had cerumen disimpaction at that time as well. He was treated with Ceftin for postnasal drainage that was improved.   He has an ENT appointment later today at 1:30PM, as he's still having problem with slobbering. He was initially started on antibiotics for it, but slobbering hasn't improved.   Patient Active Problem List   Diagnosis Date Noted  . Tremor 05/08/2017  . Gait abnormality 05/08/2017  . Transient hypotension 05/05/2017  . Malnutrition of moderate degree 03/28/2017  . Syncope and collapse 03/26/2017  . Subungual hematoma of great toe of right foot 12/13/2016  . Pain of toe of right foot 12/13/2016  . Paresthesia 06/29/2016  . Tremor of right hand 06/29/2016  . Mild cognitive impairment 06/29/2016  . Tremors of nervous system 06/22/2016  . Drooling 06/22/2016  . Loss of weight 06/22/2016  . Family history of throat cancer 06/22/2016  . Abnormality of gait 06/09/2016  . Acute bronchitis 03/20/2016  . Memory loss 12/31/2013  .  Back pain 08/18/2013  . Arthritis   . Myoclonic jerking   . Right inguinal hernia 03/17/2011  . Umbilical hernia 55/73/2202  . Acute pulmonary embolus ([place]) 11/07/2009  . DVT of lower extremity (deep venous thrombosis) ([place]) 11/07/2009   Past Medical History:  Diagnosis Date  . Acute pulmonary embolus ([place]) 11/07/2009  . Arthritis    neck - limited range of motion; back, shoulders  . Chronic back pain   . Difficult intubation   . DVT of lower extremity (deep venous thrombosis) ([place]) 11/07/2009  . Occasional tremors 03/2017   HANDS   . Right inguinal hernia 02/2011  . Seizures  ([place])   . Syncope and collapse 03/26/2017  . Umbilical hernia 54/2706   Past Surgical History:  Procedure Laterality Date  . HERNIA REPAIR    . INGUINAL HERNIA REPAIR  03/17/2011   Procedure: HERNIA REPAIR INGUINAL ADULT;  Surgeon: Adin Hector, MD;  Location: [place];  Service: General;  Laterality: Right;  right inguinal hernia repair with mesh  . [place]   left  . UMBILICAL HERNIA REPAIR  03/17/2011   Procedure: HERNIA REPAIR UMBILICAL ADULT;  Surgeon: Adin Hector, MD;  Location: [place];  Service: General;  Laterality: N/A;  umbilical hernia repair with mesh   No Known Allergies Prior to Admission medications   Medication Sig Start Date End Date Taking? Authorizing Provider  HYDROcodone-acetaminophen (NORCO) 10-325 MG tablet Take 1 tablet by mouth every 6 (six) hours as needed (for pain).    [provider]  lidocaine (XYLOCAINE) 5 % ointment lidocaine 5 % topical ointment    [provider]  propranolol (INDERAL) 40 MG tablet propranolol 40 mg tablet    [provider]   Social History   Socioeconomic History  . Marital status: Married    Spouse name: [name]  . Number of children: 5  . Years of education: college  . Highest education level: Not on file  Occupational History  . Occupation: DIRECTOR    Employer: [employer]  . Financial resource strain: Not on file  . Food insecurity:    Worry: Not on file    Inability: Not on file  . Transportation needs:    Medical: Not on file    Non-medical: Not on file  Tobacco Use  . Smoking status: Never Smoker  . Smokeless tobacco: Never Used  Substance and Sexual Activity  . Alcohol use: No  . Drug use: No  . Sexual activity: Yes    Birth control/protection: Surgical  Lifestyle  . Physical activity:    Days per week: Not on file    Minutes per session: Not on file  . Stress: Not on file  Relationships  . Social  connections:    Talks on phone: Not on file    Gets together: Not on file    Attends religious service: Not on file    Active member of club or organization: Not on file    Attends meetings of clubs or organizations: Not on file    Relationship status: Not on file  . Intimate partner violence:    Fear of current or ex partner: Not on file    Emotionally abused: Not on file    Physically abused: Not on file    Forced sexual activity: Not on file  Other Topics Concern  . Not on file  Social History Narrative   Patient lives at home  with his wife [name]).   Disabled.   Education college.   Right handed.   Caffeine one cup of coffee daily.   Review of Systems  Constitutional: Positive for unexpected weight change. Negative for fatigue.  Eyes: Negative for visual disturbance.  Respiratory: Negative for cough, chest tightness and shortness of breath.   Cardiovascular: Negative for chest pain, palpitations and leg swelling.  Gastrointestinal: Negative for abdominal pain and Johnson in stool.  Neurological: Negative for dizziness, light-headedness and headaches.       Objective:   Physical Exam  Constitutional: He is oriented to person, place, and time. He appears well-developed and well-nourished.  HENT:  Head: Normocephalic and atraumatic.  Eyes: Pupils are equal, round, and reactive to light. EOM are normal.  Neck: No JVD present. Carotid bruit is not present.  Cardiovascular: Normal rate, regular rhythm and normal heart sounds.  No murmur heard. Pulmonary/Chest: Effort normal and breath sounds normal. He has no rales.  Musculoskeletal: He exhibits no edema.  Neurological: He is alert and oriented to person, place, and time.  Skin: Skin is warm and dry.  Psychiatric: He has a normal mood and affect.  Vitals reviewed.   Vitals:   08/30/17 1134  BP: 110/72  Pulse: (!) 59  Temp: 98.3 F (36.8 C)  TempSrc: Oral  SpO2: 99%  Weight: 180 lb 12.8 oz (82 kg)  Height: 6'  (1.829 m)       Assessment & Plan:  ARLYNN MCDERMID is a 62 y.o. male Loss of weight - Plan: C-reactive protein, Sedimentation Rate, Prealbumin, Comprehensive metabolic panel Malnutrition, unspecified type (HCC) - Plan: C-reactive protein, Sedimentation Rate, Prealbumin, Comprehensive metabolic panel  -Suspected calorie deficit when evaluated earlier this year in hospital.  Was improving with increased p.o. intake with supplements as well as regular meals.  Reassuring evaluation with ENT, as well as reported improved prostate testing.  He is present with family member who did discuss potential for increased activity  including with his tremor and decreased p.o. intake including eating partial meals as possible contributor.  -recheck his CMP and prealbumin level, repeat sed rate and CRP as it was mildly elevated previously, then decide if further work-up needed.  No other focal symptoms or new areas of pain.  RTC precautions if persistent weight loss in spite of sufficient caloric intake.  -Does report some persistent consolidation, plan to follow-up with ENT to discuss the symptoms further, or I can follow-up to discuss other work-up as well if needed.   Elevated PSA  -Improved with repeat testing at urology.  Follow-up as planned per urology.  No orders of the defined types were placed in this encounter.  Patient Instructions   Depending on your visit with ear nose and throat today we can look into other causes of slobbering.   I will check your protein levels, and inflammation tests again.  If protein levels are low, I would recommend meeting with nutritionist. Continue to eat 3 meals per day, ensure as needed to supplement for now.   If inflammation test is elevated, may need to look at other conditions that could be causing weight loss.     IF you received an x-Johnson today, you will receive an invoice from [facility]. Please contact [facility] at [phone] with  questions or concerns regarding your invoice.   IF you received labwork today, you will receive an invoice from [place]. Please contact [facility] at [phone] with questions or concerns regarding your invoice.   Our  billing staff will not be able to assist you with questions regarding bills from these companies.  You will be contacted with the lab results as soon as they are available. The fastest way to get your results is to activate your My Chart account. Instructions are located on the last page of this paperwork. If you have not heard from [place] regarding the results in 2 weeks, please contact this office.       I personally performed the services described in this documentation, which was scribed in my presence. The recorded information has been reviewed and considered for accuracy and completeness, addended by me as needed, and agree with information above.  Signed,   Cory Ray, MD Primary Care at [place].  09/02/17 9:50 PM

## 2017-08-30 NOTE — Patient Instructions (Addendum)
Depending on your visit with ear nose and throat today we can look into other causes of slobbering.   I will check your protein levels, and inflammation tests again.  If protein levels are low, I would recommend meeting with nutritionist. Continue to eat 3 meals per day, ensure as needed to supplement for now.   If inflammation test is elevated, may need to look at other conditions that could be causing weight loss.     IF you received an x-ray today, you will receive an invoice from [facility]. Please contact [facility] at [phone] with questions or concerns regarding your invoice.   IF you received labwork today, you will receive an invoice from [facility]. Please contact [facility] at [phone] with questions or concerns regarding your invoice.   Our billing staff will not be able to assist you with questions regarding bills from these companies.  You will be contacted with the lab results as soon as they are available. The fastest way to get your results is to activate your My Chart account. Instructions are located on the last page of this paperwork. If you have not heard from us regarding the results in 2 weeks, please contact this office.

## 2017-08-31 LAB — COMPREHENSIVE METABOLIC PANEL
ALT: 5 IU/L (ref 0–44)
AST: 12 IU/L (ref 0–40)
Albumin/Globulin Ratio: 1.5 (ref 1.2–2.2)
Albumin: 4.3 g/dL (ref 3.6–4.8)
Alkaline Phosphatase: 78 IU/L (ref 39–117)
BUN/Creatinine Ratio: 14 (ref 10–24)
BUN: 11 mg/dL (ref 8–27)
Bilirubin Total: 0.7 mg/dL (ref 0.0–1.2)
CO2: 26 mmol/L (ref 20–29)
Calcium: 9.1 mg/dL (ref 8.6–10.2)
Chloride: 100 mmol/L (ref 96–106)
Creatinine, Ser: 0.81 mg/dL (ref 0.76–1.27)
GFR calc Af Amer: 109 mL/min/{1.73_m2} (ref >59)
GFR calc non Af Amer: 95 mL/min/{1.73_m2} (ref >59)
Globulin, Total: 2.9 g/dL (ref 1.5–4.5)
Glucose: 70 mg/dL (ref 65–99)
Potassium: 3.8 mmol/L (ref 3.5–5.2)
Sodium: 142 mmol/L (ref 134–144)
Total Protein: 7.2 g/dL (ref 6.0–8.5)

## 2017-08-31 LAB — C-REACTIVE PROTEIN: CRP: 15 mg/L — ABNORMAL HIGH (ref 0–10)

## 2017-08-31 LAB — SEDIMENTATION RATE: Sed Rate: 5 mm/hr (ref 0–30)

## 2017-08-31 LAB — PREALBUMIN: PREALBUMIN: 16 mg/dL (ref 10–36)

## 2017-09-02 ENCOUNTER — Encounter: Payer: Self-pay | Admitting: Family Medicine

## 2017-09-17 ENCOUNTER — Encounter: Payer: Self-pay | Admitting: Family Medicine

## 2017-09-17 ENCOUNTER — Ambulatory Visit: Payer: 59 | Admitting: Family Medicine

## 2017-09-17 ENCOUNTER — Ambulatory Visit (INDEPENDENT_AMBULATORY_CARE_PROVIDER_SITE_OTHER): Payer: 59

## 2017-09-17 ENCOUNTER — Telehealth: Payer: Self-pay | Admitting: *Deleted

## 2017-09-17 ENCOUNTER — Ambulatory Visit: Payer: 59 | Admitting: Neurology

## 2017-09-17 ENCOUNTER — Encounter: Payer: Self-pay | Admitting: Neurology

## 2017-09-17 VITALS — BP 98/60 | HR 60 | Temp 97.6°F | Ht 73.0 in | Wt 178.4 lb

## 2017-09-17 VITALS — BP 102/60 | HR 64 | Ht 73.0 in | Wt 179.0 lb

## 2017-09-17 DIAGNOSIS — R82998 Other abnormal findings in urine: Secondary | ICD-10-CM

## 2017-09-17 DIAGNOSIS — D649 Anemia, unspecified: Secondary | ICD-10-CM | POA: Diagnosis not present

## 2017-09-17 DIAGNOSIS — G8929 Other chronic pain: Secondary | ICD-10-CM

## 2017-09-17 DIAGNOSIS — G4752 REM sleep behavior disorder: Secondary | ICD-10-CM

## 2017-09-17 DIAGNOSIS — G3281 Cerebellar ataxia in diseases classified elsewhere: Secondary | ICD-10-CM

## 2017-09-17 DIAGNOSIS — M79674 Pain in right toe(s): Secondary | ICD-10-CM

## 2017-09-17 DIAGNOSIS — R251 Tremor, unspecified: Secondary | ICD-10-CM

## 2017-09-17 DIAGNOSIS — M545 Low back pain: Secondary | ICD-10-CM

## 2017-09-17 DIAGNOSIS — R413 Other amnesia: Secondary | ICD-10-CM

## 2017-09-17 DIAGNOSIS — R634 Abnormal weight loss: Secondary | ICD-10-CM | POA: Diagnosis not present

## 2017-09-17 DIAGNOSIS — R972 Elevated prostate specific antigen [PSA]: Secondary | ICD-10-CM

## 2017-09-17 LAB — POCT URINALYSIS DIP (MANUAL ENTRY)
Glucose, UA: NEGATIVE mg/dL
Nitrite, UA: NEGATIVE
Protein Ur, POC: 30 mg/dL — AB
Spec Grav, UA: 1.02 (ref 1.010–1.025)
Urobilinogen, UA: 1 E.U./dL
pH, UA: 6 (ref 5.0–8.0)

## 2017-09-17 LAB — POC MICROSCOPIC URINALYSIS (UMFC): Mucus: ABSENT

## 2017-09-17 MED ORDER — PROPRANOLOL HCL 40 MG PO TABS: 40.0000 mg | ORAL_TABLET | Freq: Two times a day (BID) | ORAL | 4 refills | Status: DC

## 2017-09-17 NOTE — Progress Notes (Addendum)
Subjective:  By signing my name below, I, Cory Johnson, attest that this documentation has been prepared under the direction and in the presence of [name], MD Electronically Signed: Ladene Artist, ED Scribe 09/17/2017 at 9:13 AM.   Patient ID: Cory Johnson, male    DOB: [birth year], 62 y.o.   MRN: [id number]  Chief Complaint  Patient presents with  . Weight Check   HPI Cory Johnson is a 62 y.o. male who presents to Primary Care at [facility] for f/u on weight loss. Last seen 6/20. Prev thought to be due to decreased nutrition when seen in hospital in Jan. Low protein of 5.3, albumin 2.7. Weight increased from 182 to 185 from mar to April with increased meals and Ensure supplements. Proteins levels had improved. Weight had decreased from April to June by 5 lbs. Did have elevated PSA. Met with urology. Initial plan for biopsy then PSA improved. Evaluated with ENT for throat hoarseness. Normal imaging of throat. Clear CXR in Jan. Colonoscopy in 05/2016, normal, rpt in 10 yrs. Normal sed rate, pre-albumin, CMP. CRP was slightly elevated at 15 similar to reading of 14 1 yr ago. Does have significant tremor. Very active at home based on hx from son who felt he may not be eating full meals. Had a non-reactive HIV and TSH in Jan. - Pt denies blood in stools, melena, abdominal pain, vomiting.  On additional history - called [facility] - PSA of 6.8 in April, did plan on follow up on 5/22/ but that appointment had to be cancelled. Has not had repeat testing or biopsy.   Depression screen [facility] 2/9 09/17/2017 08/30/2017 06/21/2017 05/24/2017 05/05/2017  Decreased Interest 0 0 0 0 0  Down, Depressed, Hopeless 0 0 0 0 0  PHQ - 2 Score 0 0 0 0 0     Wt Readings from Last 3 Encounters:  09/17/17 178 lb 6.4 oz (80.9 kg)  08/30/17 180 lb 12.8 oz (82 kg)  06/21/17 185 lb (83.9 kg)  On review of chart, his weight was up to 211 in 06/2016. - He does report that his brother had a stroke last wk  so he stayed with him and wasn't eating as much or drinking Ensure as much as he typically would.  Memory Has been evaluated by neuro for tremor and memory loss. Trial of sinemet which didn't help. On inderal for tremor. - Wife states that his memory is unchanged. She has noticed that he starts projects but doesn't ever finish them, more so over the past few months.  Low Back Pain Pt does report low back pain that is worse in the morning, improves as the day progresses. Reports some mild neck pain. Back pain is being treated with hydrocodone by Dr. [name] but expresses concern that no imaging has been done. H/o degenerative lumbar disease and chronic pain syndrome at L5/S1, neck pan DDD with auto fusion C2-C6.  R Great Toe Pain Pt reports some R great toe pain and intermittent numbness x 1 yr. Denies any other extremity pain. He does report a h/o gout.  Patient Active Problem List   Diagnosis Date Noted  . Tremor 05/08/2017  . Gait abnormality 05/08/2017  . Transient hypotension 05/05/2017  . Malnutrition of moderate degree 03/28/2017  . Syncope and collapse 03/26/2017  . Subungual hematoma of great toe of right foot 12/13/2016  . Pain of toe of right foot 12/13/2016  . Paresthesia 06/29/2016  . Tremor of right hand 06/29/2016  .  Mild cognitive impairment 06/29/2016  . Tremors of nervous system 06/22/2016  . Drooling 06/22/2016  . Loss of weight 06/22/2016  . Family history of throat cancer 06/22/2016  . Abnormality of gait 06/09/2016  . Acute bronchitis 03/20/2016  . Memory loss 12/31/2013  . Back pain 08/18/2013  . Arthritis   . Myoclonic jerking   . Right inguinal hernia 03/17/2011  . Umbilical hernia 74/16/3845  . Acute pulmonary embolus ([place]) 11/07/2009  . DVT of lower extremity (deep venous thrombosis) ([place]) 11/07/2009   Past Medical History:  Diagnosis Date  . Acute pulmonary embolus ([place]) 11/07/2009  . Arthritis    neck - limited range of motion; back, shoulders  .  Chronic back pain   . Difficult intubation   . DVT of lower extremity (deep venous thrombosis) ([place]) 11/07/2009  . Occasional tremors 03/2017   HANDS   . Right inguinal hernia 02/2011  . Seizures ([place])   . Syncope and collapse 03/26/2017  . Umbilical hernia 36/4680   Past Surgical History:  Procedure Laterality Date  . HERNIA REPAIR    . INGUINAL HERNIA REPAIR  03/17/2011   Procedure: HERNIA REPAIR INGUINAL ADULT;  Surgeon: Adin Hector, MD;  Location: [place];  Service: General;  Laterality: Right;  right inguinal hernia repair with mesh  . [place]   left  . UMBILICAL HERNIA REPAIR  03/17/2011   Procedure: HERNIA REPAIR UMBILICAL ADULT;  Surgeon: Adin Hector, MD;  Location: [place];  Service: General;  Laterality: N/A;  umbilical hernia repair with mesh   No Known Allergies Prior to Admission medications   Medication Sig Start Date End Date Taking? Authorizing Provider  HYDROcodone-acetaminophen (NORCO) 10-325 MG tablet Take 1 tablet by mouth every 6 (six) hours as needed (for pain).    [provider]  lidocaine (XYLOCAINE) 5 % ointment lidocaine 5 % topical ointment    [provider]  propranolol (INDERAL) 40 MG tablet propranolol 40 mg tablet    [provider]   Social History   Socioeconomic History  . Marital status: Married    Spouse name: [name]  . Number of children: 5  . Years of education: college  . Highest education level: Not on file  Occupational History  . Occupation: DIRECTOR    Employer: [employer]  . Financial resource strain: Not on file  . Food insecurity:    Worry: Not on file    Inability: Not on file  . Transportation needs:    Medical: Not on file    Non-medical: Not on file  Tobacco Use  . Smoking status: Never Smoker  . Smokeless tobacco: Never Used  Substance and Sexual Activity  . Alcohol use: No  . Drug use: No  . Sexual  activity: Yes    Birth control/protection: Surgical  Lifestyle  . Physical activity:    Days per week: Not on file    Minutes per session: Not on file  . Stress: Not on file  Relationships  . Social connections:    Talks on phone: Not on file    Gets together: Not on file    Attends religious service: Not on file    Active member of club or organization: Not on file    Attends meetings of clubs or organizations: Not on file    Relationship status: Not on file  . Intimate partner violence:    Fear of current or ex partner:  Not on file    Emotionally abused: Not on file    Physically abused: Not on file    Forced sexual activity: Not on file  Other Topics Concern  . Not on file  Social History Narrative   Patient lives at home with his wife [name]).   Disabled.   Education college.   Right handed.   Caffeine one cup of coffee daily.   Review of Systems  Constitutional: Positive for unexpected weight change.  Respiratory: Negative for shortness of breath.   Cardiovascular: Negative for chest pain.  Gastrointestinal: Negative for abdominal pain, blood in stool and vomiting.  Musculoskeletal: Positive for arthralgias, back pain and neck pain (mild). Negative for myalgias.  Neurological: Positive for tremors and numbness.      Objective:   Physical Exam  Constitutional: He is oriented to person, place, and time. He appears well-developed and well-nourished. No distress.  HENT:  Head: Normocephalic and atraumatic.  Eyes: Conjunctivae and EOM are normal.  Neck: Neck supple. No tracheal deviation present.  Cardiovascular: Normal rate and regular rhythm. Exam reveals no gallop and no friction rub.  No murmur heard. Pulmonary/Chest: Effort normal. No respiratory distress.  Musculoskeletal: Normal range of motion.  Thickened discolored nail. Surrounding nail fold is normal. No warmth or erythema. No soft tissue swelling. Normal ROM at MTP and IP. Complains of pain from MTP  through R great toe but no focal bony tenderness. Discomfort at lower L-spine. Radiation to paraspinal muscles.  Neurological: He is alert and oriented to person, place, and time.  Resting tremor R greater than L arm.  Skin: Skin is warm and dry.  Psychiatric: He has a normal mood and affect. His behavior is normal.  Nursing note and vitals reviewed.  Vitals:   09/17/17 0852  BP: 98/60  Pulse: 60  Temp: 97.6 F (36.4 C)  TempSrc: Oral  SpO2: 91%  Weight: 178 lb 6.4 oz (80.9 kg)  Height: 6' 1"  (1.854 m)    Dg Lumbar Spine Complete  Result Date: 09/17/2017 CLINICAL DATA:  Low back pain, weight loss. History of degenerative disc disease. EXAM: LUMBAR SPINE - COMPLETE 4+ VIEW COMPARISON:  Coronal and sagittal CT images through the lumbar spine from a CT scan of October 25, 2005. FINDINGS: Excessive bowel gas and stool limits the study. The lumbar vertebral bodies are preserved in height. There is moderate disc space narrowing at L4-5 and L5-S1. There is no spondylolisthesis. There is scleroses involving the adjacent endplates anteriorly of L1 and L2. The pedicles and transverse processes are grossly intact. IMPRESSION: Degenerative disc disease centered at L4-5 and L5-S1. No compression fracture nor lytic or blastic lesions are observed. Electronically Signed   By: David  Martinique M.D.   On: 09/17/2017 10:17   Dg Toe Great Right  Result Date: 09/17/2017 CLINICAL DATA:  Right great toe pain.  No mention of trauma. EXAM: RIGHT GREAT TOE COMPARISON:  Right great toe series of December 13, 2016 FINDINGS: The bones are subjectively adequately mineralized. There is no lytic nor blastic lesion. The interphalangeal and MTP joint spaces appear normal. No erosive changes are observed. There are no abnormal soft tissue calcifications. IMPRESSION: There is no acute or significant chronic bony abnormality of the right great toe. Electronically Signed   By: David  Martinique M.D.   On: 09/17/2017 10:17   Results for  orders placed or performed in visit on 09/17/17  POCT urinalysis dipstick  Result Value Ref Range   Color, UA yellow yellow  Clarity, UA clear clear   Glucose, UA negative negative mg/dL   Bilirubin, UA small (A) negative   Ketones, POC UA trace (5) (A) negative mg/dL   Spec Grav, UA 1.020 1.010 - 1.025   Blood, UA small (A) negative   pH, UA 6.0 5.0 - 8.0   Protein Ur, POC =30 (A) negative mg/dL   Urobilinogen, UA 1.0 0.2 or 1.0 E.U./dL   Nitrite, UA Negative Negative   Leukocytes, UA Small (1+) (A) Negative  POCT Microscopic Urinalysis ([facility])  Result Value Ref Range   WBC,UR,HPF,POC Too numerous to count  (A) None WBC/hpf   RBC,UR,HPF,POC Many (A) None RBC/hpf   Bacteria None None, Too numerous to count   Mucus Absent Absent   Epithelial Cells, UR Per Microscopy Few (A) None, Too numerous to count cells/hpf     Over 40 minutes with chart review and face to face time with greater than 50% counseling.      Assessment & Plan:  ABDULKADIR EMMANUEL is a 62 y.o. male Abnormal weight loss - Plan: TSH, POCT urinalysis dipstick, POCT Microscopic Urinalysis ([facility]) Memory change - Plan: TSH Chronic low back pain, unspecified back pain laterality, with sciatica presence unspecified - Plan: DG Lumbar Spine Complete  -Persistent weight loss/difficulty with weight gain even with increased calorie intake.  See prior notes. borderline elevated CRP, but sed rate is normal. Previous chest x-ray was reassuring, recent colonoscopy in 2018 was also normal.    - Denies any new areas of pain.  Long-standing low back pain, x-ray noted without apparent concerning findings.  Can discuss more advanced imaging with his pain management provider,  Or can refer to separate orthopedist as needed   -Elevated PSA previously.  Initially had reported that this had been rechecked and had improved, without apparent need for biopsy.  However on call to urology, has not been rechecked and other appointment had been  cancelled. May still need biopsy.  Will repeat PSA initially then coordinate care with urologist.  -With memory changes, will have him seen by neurologist sooner.  Staff message was sent and her office will be scheduling appointment.  -Prior anemia noted, recheck hemoglobin.  If still low, would consider evaluation with hematology who may also be able to help with work-up of weight loss   Low hemoglobin - Plan: CBC  - as above  Elevated PSA - Plan: POCT urinalysis dipstick, POCT Microscopic Urinalysis ([facility]), PSA, Urine Culture  - as above  Great toe pain, right - Plan: DG Toe Great Right  -No concerning findings on x-ray, does not appear to be gout.  RTC precautions if persistent or worsening  Urine WBC increased - Plan: Urine Culture  -Few WBCs noted on culture, asymptomatic.  Check urine culture to determine if treatment needed.  Recheck 3 to 4 weeks to determine next step, sooner if needed.  Plan discussed with patient and spouse   And all questions were answered.  No orders of the defined types were placed in this encounter.  Patient Instructions   I will recheck the PSA test today as it does not appear to have been rechecked by urology.  If still elevated, I would recommend follow-up with urology for possible prostate biopsy. I will also check a urine culture as there were some infection fighting cells in the urine.    I sent a message to your neurologist to see if we can have you seen sooner to look into causes of weight loss and possible dementia  with mental status issues.  Hemoglobin was borderline low in the past, so I will recheck that today as well as recheck your thyroid test although that was normal in January.  If blood count is low, would recommend evaluation with hematology/oncology who also may be able to look into further causes of your weight loss.  I will make that referral once I know the lab results.  X-ray of your toe is normal in the back x-ray did not indicate  any concerning findings.  Still appears to be degenerative disc disease.  Follow-up with your pain management provider but if worsening pain could consider advanced imaging such as CT scan or possible MRI.  That can be discussed further with Dr. [name] or I can refer you to another back specialist if needed.  Please let me know.  Depending on the evaluation with neurology and other testing above, can follow-up in the next 3 to 4 weeks to decide on other testing or referrals.  Please let me know if you have questions in the meantime or if there are any worsening symptoms.  Return to the clinic or go to the nearest emergency room if any of your symptoms worsen or new symptoms occur.      IF you received an x-ray today, you will receive an invoice from [facility]. Please contact [facility] at [phone] with questions or concerns regarding your invoice.   IF you received labwork today, you will receive an invoice from [facility]. Please contact [facility] at [phone] with questions or concerns regarding your invoice.   Our billing staff will not be able to assist you with questions regarding bills from these companies.  You will be contacted with the lab results as soon as they are available. The fastest way to get your results is to activate your My Chart account. Instructions are located on the last page of this paperwork. If you have not heard from [place] regarding the results in 2 weeks, please contact this office.       I personally performed the services described in this documentation, which was scribed in my presence. The recorded information has been reviewed and considered for accuracy and completeness, addended by me as needed, and agree with information above.  Signed,   [name], MD [facility] at [facility].  09/17/17 10:42 AM

## 2017-09-17 NOTE — Progress Notes (Addendum)
PATIENT: Cory Johnson DOB: [birth year]  Chief Complaint  Patient presents with  . Memory Loss    MMSE 27/30 - 11 animals.  He is here with his wife, [name], with concerns over the following:  worsening short-term memory, bilateral hand tremors, weight loss.     HISTORICAL  Cory Johnson is a 62 years old right-handed male, accompanied by his wife to follow-up for memory loss, history of REM sleep disorder,  He had a history of cervical fusion, due to advanced degenerative disc disease, presenting with progressive anterocollis, severe limited range of motion of his neck muscles  I saw him previously for  frequent nocturnal jerking movements.  He described that he had frequent transient jerking movements of his body while he was falling into sleep, he was able to quickly drifting back asleep again.   He also also has excessive movement  during the night sleep, kicking, moving his limbs, occasionally talking, cause a lot of complains from his wife.  he often dreamed of playing basketball.   He denied excessive snoring, or stop breathing during the the snoring.  He also complain excessive daytime fatigue, especially during the evening time, he would easily doze off sitting at the  meetings or watching TV.    He had sleep study in October 2013, which indeed demonstrate REM sleep disorder. There is no sleep apnea.  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 CT scan of the brain was normal. He continued to complains of neck pain, REM sleep disorder, frequent awakening during nighttime because of the neck pain, he is exercising regularly  Update June 29 2016, Last clinical visit was with Cory Johnson on June 09 2016, he noticed worsening memory loss, he had sudden worsening right hand tremor since March 2018, only mild left hand tremor, he denies significant gait abnormality, he does notice mild difficulties with smell, he still acts out of dreams, he is no longer taking clonazepam, complains of nauseous with clonazepam, he feels coldness in his fingers and toes,   He has lost weight in past few months, is 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,  He fell broke his left humeral in May 2018, did not have surgery,  We reviewed laboratory evaluation in April 2018, normal thyroid functional tests, CPK, ESR, B12, RPR, with mild elevated C reactive protein 14.2,  Continue complains of worsening bilateral hands tremor right worse than left, dragging his foot across the floor, chronic constipation, but is on chronic narcotics treatment for his low back pain, neck pain, continue have REM sleep  disorder, poor sleep, poor sense of smell, denied dizziness when getting up quickly,  UPDATE May 08 2017: He has significant bilateral hands tremor, right worse than left, but denies loss sense of smell, continue to have mild memory loss, mild gait abnormality due to his neck flexion.  UPDATE September 17 2017: He was referred back by his primary  care Dr. Carlota Raspberry for evaluation of weight loss, about 20 pounds since January 2019 by reviewing the chart,  Patient exercise daily, has mildly decreased appetite, frequent awakening at nighttime using bathroom, 1 reason  that they are more concerned about his weight is due to because he got schedule as Christmas gift, he has been taking his weight regularly. He actually had bigger weight fluctuation between May to October 2018, based on record.  He continue complains of bilateral hand shaking, denies significant gait abnormality, mild low back pain  Laboratory evaluation on August 30, 2017 showed normal CMP, prealbumin of 16, ESR of 5, C-reactive protein of 15, elevated PSA of 6.9, normal lipid profile, LDL of 86, normal CMP, TSH      REVIEW OF SYSTEMS: Full 14 system review of systems performed and notable only for chills, unexpected weight change, hearing loss, runny nose, drooling, eye redness, constipation, diarrhea, incontinence of bladder, frequent urination, urgency, back pain, walking difficulty, neck pain, neck stiffness, dizziness, numbness, tremor, passing out, agitation, confusion  ALLERGIES: No Known Allergies  HOME MEDICATIONS: Current Outpatient Medications  Medication Sig Dispense Refill  . HYDROcodone-acetaminophen (NORCO) 10-325 MG tablet Take 1 tablet by mouth every 6 (six) hours as needed (for pain).    Marland Kitchen lidocaine (XYLOCAINE) 5 % ointment lidocaine 5 % topical ointment    . propranolol (INDERAL) 40 MG tablet propranolol 40 mg tablet     No current facility-administered medications for this visit.     PAST MEDICAL HISTORY: Past Medical History:  Diagnosis Date  . Acute pulmonary embolus ([place]) 11/07/2009  . Arthritis    neck - limited range of motion; back, shoulders  . Chronic back pain   . Difficult intubation   . DVT of lower extremity (deep venous thrombosis) ([place]) 11/07/2009  . Occasional tremors 03/2017   HANDS   . Right inguinal hernia 02/2011  . Seizures  ([place])   . Syncope and collapse 03/26/2017  . Umbilical hernia 31/2811    PAST SURGICAL HISTORY: Past Surgical History:  Procedure Laterality Date  . HERNIA REPAIR    . INGUINAL HERNIA REPAIR  03/17/2011   Procedure: HERNIA REPAIR INGUINAL ADULT;  Surgeon: Adin Hector, MD;  Location: [facility];  Service: General;  Laterality: Right;  right inguinal hernia repair with mesh  . [place]   left  . UMBILICAL HERNIA REPAIR  03/17/2011   Procedure: HERNIA REPAIR UMBILICAL ADULT;  Surgeon: Adin Hector, MD;  Location: [facility];  Service: General;  Laterality: N/A;  umbilical hernia repair with mesh    FAMILY HISTORY: Family History  Problem Relation Age of Onset  . Hypertension Other     SOCIAL HISTORY:  Social History   Socioeconomic History  . Marital status: Married    Spouse name: [name]  . Number of children: 5  . Years of education: college  . Highest education level: Not on file  Occupational History  . Occupation: DIRECTOR    Employer: [employer]  . Financial resource strain: Not on file  . Food insecurity:    Worry: Not on  file    Inability: Not on file  . Transportation needs:    Medical: Not on file    Non-medical: Not on file  Tobacco Use  . Smoking status: Never Smoker  . Smokeless tobacco: Never Used  Substance and Sexual Activity  . Alcohol use: No  . Drug use: No  . Sexual activity: Yes    Birth control/protection: Surgical  Lifestyle  . Physical activity:    Days per week: Not on file    Minutes per session: Not on file  . Stress: Not on file  Relationships  . Social connections:    Talks on phone: Not on file    Gets together: Not on file    Attends religious service: Not on file    Active member of club or organization: Not on file    Attends meetings of clubs or organizations: Not on file    Relationship status: Not on file  . Intimate partner violence:    Fear of  current or ex partner: Not on file    Emotionally abused: Not on file    Physically abused: Not on file    Forced sexual activity: Not on file  Other Topics Concern  . Not on file  Social History Narrative   Patient lives at home with his wife [name]).   Disabled.   Education college.   Right handed.   Caffeine one cup of coffee daily.     PHYSICAL EXAM   Vitals:   09/17/17 1509  BP: 102/60  Pulse: 64  Weight: 179 lb (81.2 kg)  Height: _0  (1.854 m)    Not recorded      Body mass index is 23.62 kg/m.  PHYSICAL EXAMNIATION:  Gen: NAD, conversant, well nourised, obese, well groomed                     Cardiovascular: Regular rate rhythm, no peripheral edema, warm, nontender. Eyes: Conjunctivae clear without exudates or hemorrhage Neck: Supple, no carotid bruits. Pulmonary: Clear to auscultation bilaterally   NEUROLOGICAL EXAM:  MENTAL STATUS: animal naming 9 MMSE - Mini Mental State Exam 09/17/2017 05/08/2017 06/29/2016  Orientation to time _1 Orientation to Place _2 Registration _3 Attention/ Calculation _4 Recall 0 1 1  Language- name 2 objects _5 Language- repeat 1 0 1  Language- follow 3 step command _6 Language- read & follow direction _7 Write a sentence _8 Copy design _9 Total score _10 Animal naming 11.  CRANIAL NERVES: CN II: Visual fields are full to confrontation. Fundoscopic exam is normal with sharp discs and no vascular changes. Pupils are round equal and briskly reactive to light. CN III, IV, VI: extraocular movement are normal. No ptosis. CN V: Facial sensation is intact to pinprick in all 3 divisions bilaterally. Corneal responses are intact.  CN VII: Face is symmetric with normal eye closure and smile. CN VIII: Hearing is normal to rubbing fingers CN IX, X: Palate elevates symmetrically. Phonation is normal. CN XI: Head turning and shoulder shrug are intact CN XII: Tongue is midline with normal  movements and no atrophy.  MOTOR: He has bilateral hands posturing tremor, right worse than left, there is no significant rigidity or bradykinesia, he has limited range of motion of his neck muscles.  REFLEXES: Reflexes are 2+ and symmetric at  the biceps, triceps, knees, and ankles. Plantar responses are flexor.  SENSORY: Intact to light touch, pinprick, positional sensation and vibratory sensation are intact in fingers and toes.  COORDINATION: Rapid alternating movements and fine finger movements are intact. There is no dysmetria on finger-to-nose and heel-knee-shin.    GAIT/STANCE: Neck flexion, steady gait  DIAGNOSTIC DATA (LABS, IMAGING, TESTING) - I reviewed patient records, labs, notes, testing and imaging myself where available.   ASSESSMENT AND PLAN  Cory Johnson is a 62 y.o. male   Long history of REM sleep disorder Fused cervical spine limited range of motion of cervical spine with fixed anterocollis Worsening right hand tremor  Mostly postural component, there was no significant parkinsonian features,  Likely a variation of essential tremor,  He has a short trial of Sinemet, which did not help his symptoms  Keep inderal 56m bid, potential side effect was explained.   Open MRI of the brain to rule out central nervous system structural lesion,  Weight fluctuation,  Advised him carefully observing his weight, increase water intake,   YMarcial Pacas M.D. Ph.D.  [facility] [address] [place] [id number]: [phone]: [phone] CC: Referring Provider

## 2017-09-17 NOTE — Telephone Encounter (Signed)
I have called and left the patient messages at both listed numbers.  When he calls back, please offer him an earlier appt.  He is currently with Eber Jonesarolyn on 11/07/17. Please move him to Dr. Zannie CoveYan's schedule.  If he calls back soon, then we can offer him the available slot today.  If not, then offer him 10/08/17 at 10:30 or 11am (arriving 30 minutes prior to appt time for memory testing/check-in).  Thank you.      Levert FeinsteinYan, Yijun, MD  Shade FloodGreene, Jeffrey R, MD  Cc: Lilla ShookKirkman, Deangleo Passage C, RN        Dr. Neva SeatGreene:   Thanks for update. I will ask my nurse call him for an earlier appointment.   Yijun   Previous Messages    ----- Message -----  From: Shade FloodGreene, Jeffrey R, MD  Sent: 09/17/2017  9:47 AM  To: Levert FeinsteinYijun Yan, MD   Dr. [name],   I would like to ask for your help with Mr. Cory Johnson if possible. He has had some continued weight loss since last year, and I have been evaluating potential causes for the past few months. He has an elevated CRP, but normal sed rate and other blood work has not been helpful. His nutrition has improved, but not seeing gains. His wife was present with him today and does note that he is having more issues with memory and forgetting things. They are also struggling to figure out cause of weight loss. I do not see that he has an appointment until the end of August and was hoping you can see him sooner and help me look at other potential causes of weight loss, including dementia. Appreciate any help you can provide.   Cory Johnson

## 2017-09-17 NOTE — Patient Instructions (Addendum)
I will recheck the PSA test today as it does not appear to have been rechecked by urology.  If still elevated, I would recommend follow-up with urology for possible prostate biopsy. I will also check a urine culture as there were some infection fighting cells in the urine.    I sent a message to your neurologist to see if we can have you seen sooner to look into causes of weight loss and possible dementia with mental status issues.  Hemoglobin was borderline low in the past, so I will recheck that today as well as recheck your thyroid test although that was normal in January.  If blood count is low, would recommend evaluation with hematology/oncology who also may be able to look into further causes of your weight loss.  I will make that referral once I know the lab results.  X-ray of your toe is normal in the back x-ray did not indicate any concerning findings.  Still appears to be degenerative disc disease.  Follow-up with your pain management provider but if worsening pain could consider advanced imaging such as CT scan or possible MRI.  That can be discussed further with Dr. Ethelene Halamos or I can refer you to another back specialist if needed.  Please let me know.  Depending on the evaluation with neurology and other testing above, can follow-up in the next 3 to 4 weeks to decide on other testing or referrals.  Please let me know if you have questions in the meantime or if there are any worsening symptoms.  Return to the clinic or go to the nearest emergency room if any of your symptoms worsen or new symptoms occur.      IF you received an x-ray today, you will receive an invoice from [facility]. Please contact [facility] at [phone] with questions or concerns regarding your invoice.   IF you received labwork today, you will receive an invoice from [facility]. Please contact [facility] at [phone] with questions or concerns regarding your invoice.   Our billing staff will not  be able to assist you with questions regarding bills from these companies.  You will be contacted with the lab results as soon as they are available. The fastest way to get your results is to activate your My Chart account. Instructions are located on the last page of this paperwork. If you have not heard from us regarding the results in 2 weeks, please contact this office.

## 2017-09-17 NOTE — Telephone Encounter (Signed)
-----   Message from Levert FeinsteinYijun Yan, MD sent at 09/17/2017 11:22 AM EDT ----- Dr. Neva SeatGreene:  Thanks for update.  I will ask my nurse call him for an earlier appointment.  Yijun  ----- Message ----- From: Shade FloodGreene, Jeffrey R, MD Sent: 09/17/2017   9:47 AM To: Levert FeinsteinYijun Yan, MD  Dr. [name],   I would like to ask for your help with Mr. Cory Johnson if possible.  He has had some continued weight loss since last year, and I have been evaluating potential causes for the past few months.  He has an elevated CRP, but normal sed rate and other blood work has not been helpful. His nutrition has improved, but not seeing gains.  His wife was present with him today and does note that he is having more issues with memory and forgetting things.  They are also struggling to figure out cause of weight loss.  I do not see that he has an appointment until the end of August and was hoping you can see him sooner and help me look at other potential causes of weight loss, including dementia.  Appreciate any help you can provide.  Silas SacramentoJeff Greene

## 2017-09-18 ENCOUNTER — Telehealth: Payer: Self-pay | Admitting: Neurology

## 2017-09-18 LAB — CBC
Hematocrit: 43.9 % (ref 37.5–51.0)
Hemoglobin: 14.4 g/dL (ref 13.0–17.7)
MCH: 29.3 pg (ref 26.6–33.0)
MCHC: 32.8 g/dL (ref 31.5–35.7)
MCV: 89 fL (ref 79–97)
Platelets: 182 10*3/uL (ref 150–450)
RBC: 4.91 x10E6/uL (ref 4.14–5.80)
RDW: 14.3 % (ref 12.3–15.4)
WBC: 5.7 10*3/uL (ref 3.4–10.8)

## 2017-09-18 LAB — TSH: TSH: 1.48 u[IU]/mL (ref 0.450–4.500)

## 2017-09-18 LAB — PSA: Prostate Specific Ag, Serum: 6.1 ng/mL — ABNORMAL HIGH (ref 0.0–4.0)

## 2017-09-18 NOTE — Telephone Encounter (Signed)
[facility] [id number].

## 2017-09-19 NOTE — Telephone Encounter (Signed)
[employer] Auth: [id number]: [id number] (exp. 09/18/17 to 11/02/17) order faxed to [facility] they will reach out to the pt to schedule.

## 2017-09-20 LAB — URINE CULTURE

## 2017-09-21 ENCOUNTER — Telehealth: Payer: Self-pay | Admitting: Neurology

## 2017-09-21 NOTE — Telephone Encounter (Signed)
Pt wife(on DPR)-Cory Johnson,Cory Johnson [phone] has called stating pt was unable to have the MRI done at [facility], there was a place in [place] that was suggested to wife and pt.  Wife is asking for a call back to discuss

## 2017-09-21 NOTE — Telephone Encounter (Signed)
Left message requesting a return call.

## 2017-09-21 NOTE — Telephone Encounter (Signed)
Spoke to patient's wife on [place].  He has degenerative disc disease and he is unable to lie flat due to his back pain.  [facility] suggested the patient contact our office to have his scan scheduled in [place] at a location that offers a sit-down MRI machine.  Says they did not provide her with the facility's name but was told we would know the location.  His wife, Cory Johnson, would like a call back to help with scheduling at the new location.  Her best contact number is [phone].

## 2017-09-24 ENCOUNTER — Other Ambulatory Visit: Payer: Self-pay

## 2017-09-24 ENCOUNTER — Encounter (HOSPITAL_COMMUNITY): Payer: Self-pay | Admitting: Emergency Medicine

## 2017-09-24 ENCOUNTER — Emergency Department (HOSPITAL_COMMUNITY): Payer: 59

## 2017-09-24 ENCOUNTER — Emergency Department (HOSPITAL_COMMUNITY)
Admission: EM | Admit: 2017-09-24 | Discharge: 2017-09-25 | Disposition: A | Discharge status: Home / Self Care | Payer: 59 | Attending: Emergency Medicine | Admitting: Emergency Medicine

## 2017-09-24 DIAGNOSIS — M5136 Other intervertebral disc degeneration, lumbar region: Secondary | ICD-10-CM | POA: Diagnosis not present

## 2017-09-24 DIAGNOSIS — J9811 Atelectasis: Secondary | ICD-10-CM | POA: Diagnosis not present

## 2017-09-24 DIAGNOSIS — N3000 Acute cystitis without hematuria: Secondary | ICD-10-CM | POA: Insufficient documentation

## 2017-09-24 DIAGNOSIS — R55 Syncope and collapse: Secondary | ICD-10-CM | POA: Insufficient documentation

## 2017-09-24 DIAGNOSIS — Z79891 Long term (current) use of opiate analgesic: Secondary | ICD-10-CM | POA: Diagnosis not present

## 2017-09-24 DIAGNOSIS — W19XXXA Unspecified fall, initial encounter: Secondary | ICD-10-CM | POA: Diagnosis not present

## 2017-09-24 LAB — CBC
HCT: 44 % (ref 39.0–52.0)
Hemoglobin: 14.1 g/dL (ref 13.0–17.0)
MCH: 29.2 pg (ref 26.0–34.0)
MCHC: 32 g/dL (ref 30.0–36.0)
MCV: 91.1 fL (ref 78.0–100.0)
Platelets: 166 10*3/uL (ref 150–400)
RBC: 4.83 MIL/uL (ref 4.22–5.81)
RDW: 13.2 % (ref 11.5–15.5)
WBC: 11.7 10*3/uL — ABNORMAL HIGH (ref 4.0–10.5)

## 2017-09-24 LAB — URINALYSIS, ROUTINE W REFLEX MICROSCOPIC
Bacteria, UA: NONE SEEN
Bilirubin Urine: NEGATIVE
Glucose, UA: NEGATIVE mg/dL
Ketones, ur: NEGATIVE mg/dL
Nitrite: NEGATIVE
Protein, ur: NEGATIVE mg/dL
Specific Gravity, Urine: 1.016 (ref 1.005–1.030)
pH: 5 (ref 5.0–8.0)

## 2017-09-24 LAB — BASIC METABOLIC PANEL
Anion gap: 9 (ref 5–15)
BUN: 11 mg/dL (ref 8–23)
CO2: 30 mmol/L (ref 22–32)
Calcium: 9.1 mg/dL (ref 8.9–10.3)
Chloride: 101 mmol/L (ref 98–111)
Creatinine, Ser: 0.98 mg/dL (ref 0.61–1.24)
GFR calc Af Amer: >60 mL/min (ref >60)
GFR calc non Af Amer: >60 mL/min (ref >60)
Glucose, Bld: 109 mg/dL — ABNORMAL HIGH (ref 70–99)
Potassium: 3.9 mmol/L (ref 3.5–5.1)
Sodium: 140 mmol/L (ref 135–145)

## 2017-09-24 LAB — I-STAT TROPONIN, ED: Troponin i, poc: 0 ng/mL (ref 0.00–0.08)

## 2017-09-24 MED ORDER — CEFTRIAXONE SODIUM 1 G IJ SOLR
1.0000 g | Freq: Once | INTRAMUSCULAR | Status: AC
  Administered 2017-09-24: 1 g via INTRAVENOUS
  Filled 2017-09-24: qty 10

## 2017-09-24 MED ORDER — SODIUM CHLORIDE 0.9 % IV BOLUS
1000.0000 mL | Freq: Once | INTRAVENOUS | Status: AC
  Administered 2017-09-24: 1000 mL via INTRAVENOUS

## 2017-09-24 NOTE — ED Triage Notes (Signed)
Pt BIB [facility] from home with c/o syncope. Pt reports feeling dizzy, and falling into a wall, states he's been feeling increased fatigue for the last few weeks. Denies hitting head, c/o chronic back pain. Tremors to BUE. EMS vitals: BP 128/84, HR 74

## 2017-09-24 NOTE — ED Provider Notes (Signed)
[facility] EMERGENCY [facility]PARTMENT Provider Note   CSN: [id number] Arrival date & time: 09/24/17  2123     History   Chief Complaint Chief Complaint  Patient presents with  . Loss of Consciousness    HPI Nicoletta DressKenneth B Skluzacek is a 62 y.o. male.  Patient presents to the emergency department with a chief complaint of syncopal episode.  He states that he had been feeling dizzy today and fell into a wall as he had passed out.  He states that he has been feeling increasingly fatigued for the past few weeks.  He denies any fever, chills, cough.  He states that he has had urinary frequency and urgency.  He complains of some suprapubic abdominal pain.  He reports that he takes propranolol for resting tremor, but has not had any changes in his dosing.  He denies any other associated symptoms.  The history is provided by the patient. No language interpreter was used.    Past Medical History:  Diagnosis Date  . Acute pulmonary embolus (HCC) 11/07/2009  . Arthritis    neck - limited range of motion; back, shoulders  . Chronic back pain   . Difficult intubation   . DVT of lower extremity (deep venous thrombosis) (HCC) 11/07/2009  . Occasional tremors 03/2017   HANDS   . Right inguinal hernia 02/2011  . Seizures (HCC)   . Syncope and collapse 03/26/2017  . Umbilical hernia 02/2011    Patient Active Problem List   Diagnosis Date Noted  . 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 REM sleep behavior disorder 09/17/2017  . Tremor 05/08/2017  . Gait abnormality 05/08/2017  . Transient hypotension 05/05/2017  . Malnutrition of moderate degree 03/28/2017  . Syncope and collapse 03/26/2017  . Subungual hematoma of great toe of right foot 12/13/2016  . Pain of toe of right foot 12/13/2016  . Paresthesia 06/29/2016  . Tremor of right hand 06/29/2016  . Mild cognitive impairment 06/29/2016  . Tremors of nervous system 06/22/2016  . Drooling  06/22/2016  . Loss of weight 06/22/2016  . Family history of throat cancer 06/22/2016  . Abnormality of gait 06/09/2016  . Acute bronchitis 03/20/2016  . Memory loss 12/31/2013  . Back pain 08/18/2013  . Arthritis   . Myoclonic jerking   . Right inguinal hernia 03/17/2011  . Umbilical hernia 03/17/2011  . Acute pulmonary embolus (HCC) 11/07/2009  . DVT of lower extremity (deep venous thrombosis) (HCC) 11/07/2009    Past Surgical History:  Procedure Laterality Date  . HERNIA REPAIR    . INGUINAL HERNIA REPAIR  03/17/2011   Procedure: HERNIA REPAIR INGUINAL ADULT;  Surgeon: Ernestene MentionHaywood M Ingram, MD;  Location: [facility];  Service: General;  Laterality: Right;  right inguinal hernia repair with mesh  . KNEE SURGERY  1977   left  . UMBILICAL HERNIA REPAIR  03/17/2011   Procedure: HERNIA REPAIR UMBILICAL ADULT;  Surgeon: Ernestene MentionHaywood M Ingram, MD;  Location: [facility];  Service: General;  Laterality: N/A;  umbilical hernia repair with mesh        Home Medications    Prior to Admission medications   Medication Sig Start Date End Date Taking? Authorizing Provider  HYDROcodone-acetaminophen (NORCO) 10-325 MG tablet Take 1 tablet by mouth every 6 (six) hours as needed (for pain).    [provider]  lidocaine (XYLOCAINE) 5 % ointment lidocaine 5 % topical ointment    [provider]  propranolol (INDERAL)  40 MG tablet Take 1 tablet (40 mg total) by mouth 2 (two) times daily. 09/17/17   Levert Feinstein, MD    Family History Family History  Problem Relation Age of Onset  . Hypertension Other     Social History Social History   Tobacco Use  . Smoking status: Never Smoker  . Smokeless tobacco: Never Used  Substance Use Topics  . Alcohol use: No  . Drug use: No     Allergies   Patient has no known allergies.   Review of Systems Review of Systems  All other systems reviewed and are negative.    Physical Exam Updated Vital Signs BP  93/81 (BP Location: Right Arm)   Pulse 70   Temp 100 F (37.8 C) (Oral)   Resp 17   SpO2 100%   Physical Exam  Constitutional: He is oriented to person, place, and time. He appears well-developed and well-nourished.  HENT:  Head: Normocephalic and atraumatic.  Eyes: Pupils are equal, round, and reactive to light. Conjunctivae and EOM are normal. Right eye exhibits no discharge. Left eye exhibits no discharge. No scleral icterus.  Neck: Normal range of motion. Neck supple. No JVD present.  Cardiovascular: Normal rate, regular rhythm and normal heart sounds. Exam reveals no gallop and no friction rub.  No murmur heard. Pulmonary/Chest: Effort normal and breath sounds normal. No respiratory distress. He has no wheezes. He has no rales. He exhibits no tenderness.  Abdominal: Soft. He exhibits no distension and no mass. There is no tenderness. There is no rebound and no guarding.  Musculoskeletal: Normal range of motion. He exhibits no edema or tenderness.  Neurological: He is alert and oriented to person, place, and time.  Resting tremor in upper extremities, right greater than left  Skin: Skin is warm and dry.  Psychiatric: He has a normal mood and affect. His behavior is normal. Judgment and thought content normal.  Nursing note and vitals reviewed.    ED Treatments / Results  Labs (all labs ordered are listed, but only abnormal results are displayed) Labs Reviewed  BASIC METABOLIC PANEL  CBC  URINALYSIS, ROUTINE W REFLEX MICROSCOPIC  I-STAT TROPONIN, ED  CBG MONITORING, ED    EKG None  Radiology No results found.  Procedures Procedures (including critical care time)  Medications Ordered in ED Medications - No data to display   Initial Impression / Assessment and Plan / ED Course  I have reviewed the triage vital signs and the nursing notes.  Pertinent labs & imaging results that were available during my care of the patient were reviewed by me and considered in my  medical decision making (see chart for details).     Patient with generalized weakness and fatigue over the past week.  Stood up and felt dizzy tonight.  May have passed out.  Denies any injuries from the fall.  Marginal initial blood pressure today of 93/81.  Mildly elevated temperature to 100 degrees.  He reports associated urinary frequency and urgency.  Urinalysis is somewhat concerning for UTI.  He has some mild leukocytosis to 11.7.  We will give fluids and Rocephin.  Patient states that he feels improved after fluids.  Will ambulate and see how he does.  Will recheck orthostatic vital signs.  Anticipate discharge to home.  Discussed patient with Horton, who agrees with plan.  Also discussed the plan with the patient's family members, who are also agreeable with the plan.  1:31 AM Patient reassessed.  He ambulates without difficulty.  He is not orthostatic.  He feels improved.  Discharged home.  Final Clinical Impressions(s) / ED Diagnoses   Final diagnoses:  Acute cystitis without hematuria  Near syncope    ED Discharge Orders        Ordered    cephALEXin (KEFLEX) 500 MG capsule  4 times daily     09/25/17 0130       Roxy Horseman, PA-C 09/25/17 0131    Horton, Mayer Masker, MD 09/30/17 2258

## 2017-09-24 NOTE — Telephone Encounter (Signed)
I called the patient's wife to discuss the option of a sitting MRI. Our office reached out to the facility in [place] and they also do not have a sitting MRI. The patient would like to know what their next option would be? Would sedation be an option or what they can do from here. She feels his condition is getting worse and they are at a loss of what to do. Please call and advise.

## 2017-09-24 NOTE — Telephone Encounter (Signed)
I returned the call to his wife.  They are willing to do anything, on their part, to make this MRI happen.    The patient is unable to be positioned into a traditional MRI machine due to the following: Fused cervical spine limited range of motion of cervical spine with fixed anterocollis.  We will need to locate the nearest open, upright scanner for this patient.

## 2017-09-25 MED ORDER — CEPHALEXIN 500 MG PO CAPS: 500.0000 mg | ORAL_CAPSULE | Freq: Four times a day (QID) | ORAL | 0 refills | Status: DC

## 2017-09-25 NOTE — ED Notes (Signed)
Pt ambulated in hallway with no complaints of dizziness or lightheadedness

## 2017-09-25 NOTE — Telephone Encounter (Signed)
Patient is scheduled for 10/24/17 at [facility] in [place].

## 2017-09-25 NOTE — Telephone Encounter (Signed)
I called [facility] in [place] and spoke to Rosholtammy and she informed me the patient can have the MRI there that is upright. [facility] and she will contact the patient to schedule it. I spoke to the patient wife and informed her of this and she knows she is suppose to get a call from [facility] in [place].

## 2017-09-25 NOTE — Telephone Encounter (Signed)
[facility] has one located in [place], [place]  Phone number is [phone].

## 2017-10-03 ENCOUNTER — Encounter: Payer: Self-pay | Admitting: Podiatry

## 2017-10-03 ENCOUNTER — Ambulatory Visit: Payer: 59 | Admitting: Podiatry

## 2017-10-03 DIAGNOSIS — L609 Nail disorder, unspecified: Secondary | ICD-10-CM

## 2017-10-03 DIAGNOSIS — M79671 Pain in right foot: Secondary | ICD-10-CM | POA: Diagnosis not present

## 2017-10-03 DIAGNOSIS — L600 Ingrowing nail: Secondary | ICD-10-CM | POA: Diagnosis not present

## 2017-10-03 DIAGNOSIS — L608 Other nail disorders: Secondary | ICD-10-CM

## 2017-10-03 NOTE — Progress Notes (Signed)
Subjective: 62 y.o. year old male patient presents accompanied by his son complaining of painful nails. Patient and his son requests toe nails trimmed and remove right great toe permanently.    Objective: Dermatologic: Thick dark discolored and painful right great toe nail. The nail plate is raised up about 1/3". Pain with light pressure. Vascular: Pedal pulses are all palpable. Orthopedic: Contracted lesser digits bilateral. Neurologic: All epicritic and tactile sensations grossly intact.  Assessment: Deformed, painful and mycotic right great toe nail.  Treatment: Reviewed findings and available treatment options. As per request right great toe nail total matrixectomy done under local.  Affected right great toe was anesthetized with total 5ml mixture of 50/50 0.5% Marcaine plain and 1% Xylocaine plain. Entire nail plate was reflected with a nail elevator and removed from nail bed. Proximal nail matrix tissue was cauterized with Phenol soaked cotton applicator x 4 and neutralized with Alcohol soaked cotton applicator. The wound was dressed with Amerigel ointment dressing. Home care instructions and supply dispensed.  Return in 1 week for follow up.

## 2017-10-03 NOTE — Patient Instructions (Signed)
Ingrown nail surgery was done on right great toe, total matrixectomy. Follow soaking instruction.  Some redness and drainage is expected. Call the office if the area gets feverish with increased redness and drainage. Return in one week.

## 2017-10-09 ENCOUNTER — Ambulatory Visit (INDEPENDENT_AMBULATORY_CARE_PROVIDER_SITE_OTHER): Payer: 59 | Admitting: Podiatry

## 2017-10-09 ENCOUNTER — Encounter: Payer: Self-pay | Admitting: Podiatry

## 2017-10-09 DIAGNOSIS — L600 Ingrowing nail: Secondary | ICD-10-CM

## 2017-10-09 NOTE — Progress Notes (Signed)
S/P 6 days ingrown nail surgery right great toe, total matrixectomy right great toe. Clean nail bed with granulating pink tissue right great toe. No active drainage noted. Wound cleansed with 50% H2O2. Amerigel ointment dressing applied. Continue to soak daily and cover with antibiotic ointment dressing. Return if needed to be checked out again.

## 2017-10-09 NOTE — Patient Instructions (Signed)
6 day post op wound healing normal following total matrixectomy right great toe. Continue with daily soak. Return as needed.

## 2017-10-10 ENCOUNTER — Ambulatory Visit: Payer: 59 | Admitting: Podiatry

## 2017-10-12 ENCOUNTER — Ambulatory Visit: Payer: 59 | Admitting: Podiatry

## 2017-10-13 ENCOUNTER — Encounter: Payer: Self-pay | Admitting: Family Medicine

## 2017-10-13 ENCOUNTER — Ambulatory Visit: Payer: 59 | Admitting: Family Medicine

## 2017-10-13 ENCOUNTER — Other Ambulatory Visit: Payer: Self-pay

## 2017-10-13 VITALS — BP 128/76 | HR 53 | Temp 98.2°F | Ht 73.0 in | Wt 177.8 lb

## 2017-10-13 DIAGNOSIS — R634 Abnormal weight loss: Secondary | ICD-10-CM

## 2017-10-13 DIAGNOSIS — R351 Nocturia: Secondary | ICD-10-CM

## 2017-10-13 DIAGNOSIS — R972 Elevated prostate specific antigen [PSA]: Secondary | ICD-10-CM

## 2017-10-13 DIAGNOSIS — N390 Urinary tract infection, site not specified: Secondary | ICD-10-CM | POA: Diagnosis not present

## 2017-10-13 DIAGNOSIS — R319 Hematuria, unspecified: Secondary | ICD-10-CM

## 2017-10-13 DIAGNOSIS — R198 Other specified symptoms and signs involving the digestive system and abdomen: Secondary | ICD-10-CM

## 2017-10-13 LAB — POCT URINALYSIS DIP (MANUAL ENTRY)
Bilirubin, UA: NEGATIVE
Glucose, UA: NEGATIVE mg/dL
Leukocytes, UA: NEGATIVE
Nitrite, UA: NEGATIVE
Protein Ur, POC: NEGATIVE mg/dL
Spec Grav, UA: 1.02 (ref 1.010–1.025)
Urobilinogen, UA: 0.2 E.U./dL
pH, UA: 5 (ref 5.0–8.0)

## 2017-10-13 LAB — POC MICROSCOPIC URINALYSIS (UMFC): Mucus: ABSENT

## 2017-10-13 NOTE — Patient Instructions (Addendum)
Since still having nighttime urination, I will see if we can get you into urologist Dr. Sherryl BartersBudzyn sooner.  I would recommend discussing plan for elevated PSA and medicine Flomax for nighttime and frequent urination. I will also recheck the urine test and culture.   For weight loss, and constipation/diarrhea, I will refer you to Dr. Kinnie ScalesMedoff. Follow up with me in next 6 weeks.   Return to the clinic or go to the nearest emergency room if any of your symptoms worsen or new symptoms occur.    IF you received an x-ray today, you will receive an invoice from [facility]. Please contact [facility] at [phone] with questions or concerns regarding your invoice.   IF you received labwork today, you will receive an invoice from [facility]. Please contact [facility] at [phone] with questions or concerns regarding your invoice.   Our billing staff will not be able to assist you with questions regarding bills from these companies.  You will be contacted with the lab results as soon as they are available. The fastest way to get your results is to activate your My Chart account. Instructions are located on the last page of this paperwork. If you have not heard from us regarding the results in 2 weeks, please contact this office.

## 2017-10-13 NOTE — Progress Notes (Signed)
Subjective:    Patient ID: Cory Johnson, male    DOB: [birth year], 62 y.o.   MRN: [id number]  HPI [name] is a 62 y.o. male Presents today for: Chief Complaint  Patient presents with  . Follow-up    ED f/u from (bladder infection)  . Weight Loss    f/u   Urinary tract infection: He was seen in the ER on July 15 after a syncopal episode.  ER note reviewed.  Had been feeling dizzy, fell into the wall and then passed out.  Was having increasing fatigue over the prior few weeks without fever chills or cough.  Did note the urinary frequency and urgency and some suprapubic abdominal pain.  On propanolol for tremor but not having any change in dose recently.  Borderline elevated temp at 100.  Blood pressure borderline low initially at 93/81.  Mild leukocytosis at 11.7.  IV fluids were given, Rocephin injection given, ambulated without difficulty and not orthostatic after IV fluid treatment.  Was continued on Keflex 500 mg 4 times daily for 1 week. Min nausea, but tolerated pills otherwise.  Urine culture not obtained at ER visit, but did have previous urine culture July 8 for some frequency at that time with staph epidermidis noted.  History of elevated PSA with most recent PSA 6.1 on July 8, slightly lower than 6.9 on March 14.  He has seen urology,   Dr. Sherryl Barters in April with possible plan for biopsies.  Frequent urination has persisted with nocturia 2-3 per night.  No fever. No abd pain. No further near syncope/syncopal episodes.    Weight loss: See prior office visits regarding discussion of weight loss.  Last discussed July 8.  Elevated CRP but normal sed rate.  Recent colonoscopy was normal and chest x-ray previously was reassuring.  No new areas of pain, x-ray of low back without apparent concerning findings with his long-standing low back pain.  Plan for continued follow-up with urology given prior elevated PSA and possible need for biopsies.  He was also referred back to his  neurologist given reports of memory changes as well.  Previous borderline anemia with normal hemoglobin 2 weeks ago in the ER as well as week prior with me in the office.  Normal TSH.  He was evaluated by his neurologist on July 8.  Weight trends were reviewed back to May 2018.  Had fluctuations down to 185 up to 210, and appeared to have a bigger weight fluctuation from May to October 2018 based on those records.  Mostly postural component to tremor without significant parkinsonian features, suspected variation of essential tremor.  He had tried Sinemet in the past which did not help his symptoms.  Was continued on Inderal 40 mg twice daily and plan for open brain MRI which has not yet been performed - scheduled August 14th in [place]. FH of throat cancer, but had ENT eval without concerns in May.   Occasional constipation, occasional diarrhea. Denies abd pain. Has seen Dr. [name] in past.   Wt Readings from Last 3 Encounters:  10/13/17 177 lb 12.8 oz (80.6 kg)  09/17/17 179 lb (81.2 kg)  09/17/17 178 lb 6.4 oz (80.9 kg)     Patient Active Problem List   Diagnosis Date Noted  . 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 REM sleep behavior disorder 09/17/2017  . Tremor 05/08/2017  . Gait abnormality 05/08/2017  . Transient hypotension 05/05/2017  . Malnutrition of moderate degree 03/28/2017  .  Syncope and collapse 03/26/2017  . Subungual hematoma of great toe of right foot 12/13/2016  . Pain of toe of right foot 12/13/2016  . Paresthesia 06/29/2016  . Tremor of right hand 06/29/2016  . Mild cognitive impairment 06/29/2016  . Tremors of nervous system 06/22/2016  . Drooling 06/22/2016  . Loss of weight 06/22/2016  . Family history of throat cancer 06/22/2016  . Abnormality of gait 06/09/2016  . Acute bronchitis 03/20/2016  . Memory loss 12/31/2013  . Back pain 08/18/2013  . Arthritis   . Myoclonic jerking   . Right inguinal hernia 03/17/2011  .  Umbilical hernia 03/17/2011  . Acute pulmonary embolus (HCC) 11/07/2009  . DVT of lower extremity (deep venous thrombosis) (HCC) 11/07/2009   Past Medical History:  Diagnosis Date  . Acute pulmonary embolus (HCC) 11/07/2009  . Arthritis    neck - limited range of motion; back, shoulders  . Chronic back pain   . Difficult intubation   . DVT of lower extremity (deep venous thrombosis) (HCC) 11/07/2009  . Occasional tremors 03/2017   HANDS   . Right inguinal hernia 02/2011  . Seizures (HCC)   . Syncope and collapse 03/26/2017  . Umbilical hernia 02/2011   Past Surgical History:  Procedure Laterality Date  . HERNIA REPAIR    . INGUINAL HERNIA REPAIR  03/17/2011   Procedure: HERNIA REPAIR INGUINAL ADULT;  Surgeon: Ernestene Mention, MD;  Location: [facility];  Service: General;  Laterality: Right;  right inguinal hernia repair with mesh  . KNEE SURGERY  1977   left  . UMBILICAL HERNIA REPAIR  03/17/2011   Procedure: HERNIA REPAIR UMBILICAL ADULT;  Surgeon: Ernestene Mention, MD;  Location: [facility];  Service: General;  Laterality: N/A;  umbilical hernia repair with mesh   No Known Allergies Prior to Admission medications   Medication Sig Start Date End Date Taking? Authorizing Provider  cephALEXin (KEFLEX) 500 MG capsule Take 1 capsule (500 mg total) by mouth 4 (four) times daily. 09/25/17  Yes Roxy Horseman, PA-C  HYDROcodone-acetaminophen (NORCO) 10-325 MG tablet Take 1 tablet by mouth every 6 (six) hours as needed (for pain).   Yes [provider]  propranolol (INDERAL) 40 MG tablet Take 1 tablet (40 mg total) by mouth 2 (two) times daily. 09/17/17  Yes Levert Feinstein, MD   Social History   Socioeconomic History  . Marital status: Married    Spouse name: [name]  . Number of children: 5  . Years of education: college  . Highest education level: Not on file  Occupational History  . Occupation: DIRECTOR    Employer: [employer]    Social Needs  . Financial resource strain: Not on file  . Food insecurity:    Worry: Not on file    Inability: Not on file  . Transportation needs:    Medical: Not on file    Non-medical: Not on file  Tobacco Use  . Smoking status: Never Smoker  . Smokeless tobacco: Never Used  Substance and Sexual Activity  . Alcohol use: No  . Drug use: No  . Sexual activity: Yes    Birth control/protection: Surgical  Lifestyle  . Physical activity:    Days per week: Not on file    Minutes per session: Not on file  . Stress: Not on file  Relationships  . Social connections:    Talks on phone: Not on file    Gets together: Not on file  Attends religious service: Not on file    Active member of club or organization: Not on file    Attends meetings of clubs or organizations: Not on file    Relationship status: Not on file  . Intimate partner violence:    Fear of current or ex partner: Not on file    Emotionally abused: Not on file    Physically abused: Not on file    Forced sexual activity: Not on file  Other Topics Concern  . Not on file  Social History Narrative   Patient lives at home with his wife [name].   Disabled.   Education college.   Right handed.   Caffeine one cup of coffee daily.    Review of Systems As in HPI.     Objective:   Physical Exam  Constitutional: He is oriented to person, place, and time. He appears well-developed and well-nourished.  HENT:  Head: Normocephalic and atraumatic.  Eyes: Pupils are equal, round, and reactive to light. EOM are normal.  Neck: No JVD present. Carotid bruit is not present.  Cardiovascular: Regular rhythm and normal heart sounds.  No murmur heard. Bradycardic, but overall regular.   Pulmonary/Chest: Effort normal and breath sounds normal. He has no rales.  Musculoskeletal: He exhibits no edema.  Neurological: He is alert and oriented to person, place, and time.  Skin: Skin is warm and dry.  Psychiatric: He has a  normal mood and affect.  Vitals reviewed.  Vitals:   10/13/17 1101  BP: 128/76  Pulse: (!) 53  Temp: 98.2 F (36.8 C)  TempSrc: Oral  SpO2: 95%  Weight: 177 lb 12.8 oz (80.6 kg)  Height: 6\' 1"  (1.854 m)    Results for orders placed or performed in visit on 10/13/17  POCT Microscopic Urinalysis ([facility])  Result Value Ref Range   WBC,UR,HPF,POC None None WBC/hpf   RBC,UR,HPF,POC Few (A) None RBC/hpf   Bacteria None None, Too numerous to count   Mucus Absent Absent   Epithelial Cells, UR Per Microscopy Few (A) None, Too numerous to count cells/hpf  POCT urinalysis dipstick  Result Value Ref Range   Color, UA yellow yellow   Clarity, UA clear clear   Glucose, UA negative negative mg/dL   Bilirubin, UA negative negative   Ketones, POC UA trace (5) (A) negative mg/dL   Spec Grav, UA 9.629 5.284 - 1.025   Blood, UA small (A) negative   pH, UA 5.0 5.0 - 8.0   Protein Ur, POC negative negative mg/dL   Urobilinogen, UA 0.2 0.2 or 1.0 E.U./dL   Nitrite, UA Negative Negative   Leukocytes, UA Negative Negative         Assessment & Plan:  TYKEE HEIDEMAN is a 62 y.o. male Urinary tract infection with hematuria, site unspecified - Plan: POCT Microscopic Urinalysis ([facility]), POCT urinalysis dipstick, Urine Culture  - symptomatically improved, and reassuring in office urinalysis. rtc precautions if return of symptoms.   Nocturia - Plan: POCT Microscopic Urinalysis ([facility]), POCT urinalysis dipstick, Urine Culture Elevated PSA - Plan: POCT Microscopic Urinalysis ([facility]), POCT urinalysis dipstick, Urine Culture  - elevated psa prior but minimal change. Plan to follow up with urology to decide on biopsy, and nocturia treatment.   Loss of weight - Plan: Ambulatory referral to Gastroenterology Alternating constipation and diarrhea - Plan: Ambulatory referral to Gastroenterology  - as above, follow up with urology. Will also refer to gastroenterology to discuss bowel irregularities and  other recommendations of workup of weight  loss.   - recheck 6 weeks, sooner if new symptoms or increasing weight loss.    No orders of the defined types were placed in this encounter.  Patient Instructions   Since still having nighttime urination, I will see if we can get you into urologist Dr. Sherryl BartersBudzyn sooner.  I would recommend discussing plan for elevated PSA and medicine Flomax for nighttime and frequent urination. I will also recheck the urine test and culture.   For weight loss, and constipation/diarrhea, I will refer you to Dr. Kinnie ScalesMedoff. Follow up with me in next 6 weeks.   Return to the clinic or go to the nearest emergency room if any of your symptoms worsen or new symptoms occur.    IF you received an x-ray today, you will receive an invoice from [facility]. Please contact [facility] at [phone] with questions or concerns regarding your invoice.   IF you received labwork today, you will receive an invoice from [facility]. Please contact [facility] at [phone] with questions or concerns regarding your invoice.   Our billing staff will not be able to assist you with questions regarding bills from these companies.  You will be contacted with the lab results as soon as they are available. The fastest way to get your results is to activate your My Chart account. Instructions are located on the last page of this paperwork. If you have not heard from us regarding the results in 2 weeks, please contact this office.       Signed,   Meredith StaggersJeffrey Kadir Azucena, MD [facility].  10/14/17 10:20 PM

## 2017-10-14 ENCOUNTER — Encounter: Payer: Self-pay | Admitting: Family Medicine

## 2017-10-16 LAB — URINE CULTURE

## 2017-10-17 ENCOUNTER — Other Ambulatory Visit: Payer: Self-pay | Admitting: Family Medicine

## 2017-10-17 MED ORDER — NITROFURANTOIN MONOHYD MACRO 100 MG PO CAPS: 100.0000 mg | ORAL_CAPSULE | Freq: Two times a day (BID) | ORAL | 0 refills | Status: DC

## 2017-10-17 NOTE — Progress Notes (Unsigned)
See urine culture results 

## 2017-10-24 DIAGNOSIS — G3281 Cerebellar ataxia in diseases classified elsewhere: Secondary | ICD-10-CM | POA: Diagnosis not present

## 2017-10-24 DIAGNOSIS — R413 Other amnesia: Secondary | ICD-10-CM | POA: Diagnosis not present

## 2017-10-25 ENCOUNTER — Telehealth: Payer: Self-pay | Admitting: Family Medicine

## 2017-10-25 NOTE — Telephone Encounter (Signed)
Copied from CRM [phone]. Topic: General - Other >> Oct 25, 2017  3:36 PM Gaynelle AduPoole, Shalonda wrote: Reason for CRM: Verne SpurrWalissa is calling to advise the MRI was done in [place] on 10-24-17,she requesting a call once result are into Dr.green

## 2017-10-29 ENCOUNTER — Telehealth: Payer: Self-pay | Admitting: Neurology

## 2017-10-29 NOTE — Telephone Encounter (Signed)
Pt wife(on [place],[name] H [phone]) has called asking for a call with MRI results when available,

## 2017-10-30 NOTE — Telephone Encounter (Signed)
The patient had his MRI at [facility] in [place] on 10/24/17. I gave the report to [place]. [facility] informed me that the patient should have the CD because they give the CD to their patients when they leave.

## 2017-10-30 NOTE — Telephone Encounter (Signed)
I ordered MRI brain on July 8th 2019, can you find out where and when he had it, I did not see report in system, including care everywhere

## 2017-10-31 NOTE — Telephone Encounter (Signed)
Please call patient, MRI of brain wo on October 24 2017 from [facility] [place] reported mild small vessel disease, there was no acute abnormalities.

## 2017-10-31 NOTE — Telephone Encounter (Signed)
Pts wife Verne SpurrWalissa requesting a call to discuss pts MRI results.

## 2017-10-31 NOTE — Telephone Encounter (Signed)
Spoke to patient's wife on [place] - she is aware of results.  They will keep his pending appt and will bring his MRI CD for Dr. [name] to further review.

## 2017-11-01 NOTE — Telephone Encounter (Signed)
I do not see MRI report - can we request it be sent? Thanks.   -JG

## 2017-11-06 NOTE — Telephone Encounter (Signed)
Spoke with wife she is going to call and see if she can get results sent over.

## 2017-11-07 ENCOUNTER — Ambulatory Visit: Payer: 59 | Admitting: Nurse Practitioner

## 2017-11-09 DIAGNOSIS — Z79891 Long term (current) use of opiate analgesic: Secondary | ICD-10-CM | POA: Diagnosis not present

## 2017-11-28 ENCOUNTER — Telehealth: Payer: Self-pay | Admitting: Neurology

## 2017-11-28 NOTE — Telephone Encounter (Signed)
Left second message on wife's voicemail.

## 2017-11-28 NOTE — Telephone Encounter (Addendum)
Spoke to his wife - she would like to express the following concerns:  1) Patient starting projects and unable to finish jobs (gives the example of pulling up the carpet in their living room and leaving tools laying around).  He refuses assistance.  2) He continues to be forgetful (left water running all day recently).  3) He does not want any help and angers easily when it is offered (gives the example of paying the bills).  He has an appt, on 11/29/17, to review his MRI results.  She did not feel comfortable reporting this information in front of the patient. States the last time she was here and shared information with Dr. [name], he became upset with her as soon as they got in the car to leave.  She wants to know if there is medication that can help.

## 2017-11-28 NOTE — Telephone Encounter (Signed)
Left message on his wife's voicemail requesting a call back. [facility]([name] Wilber - on [place])

## 2017-11-28 NOTE — Telephone Encounter (Signed)
Pt wife has requested to speak to nurse or provider before appt she is very concerned of pts health that is declining and pt is refusing to speak of it to Dr. [name] about it and will deny anything said in front of provider. Wife would prefer to be called today if possible before the appt at [phone]

## 2017-11-29 ENCOUNTER — Ambulatory Visit: Payer: 59 | Admitting: Neurology

## 2017-11-29 ENCOUNTER — Encounter: Payer: Self-pay | Admitting: Neurology

## 2017-11-29 ENCOUNTER — Telehealth: Payer: Self-pay | Admitting: Neurology

## 2017-11-29 VITALS — BP 125/84 | HR 49 | Ht 74.0 in | Wt 174.0 lb

## 2017-11-29 DIAGNOSIS — R251 Tremor, unspecified: Secondary | ICD-10-CM

## 2017-11-29 NOTE — Progress Notes (Signed)
PATIENT: Cory Johnson DOB: November 21, 1955  Chief Complaint  Patient presents with  . Follow-up    MRI follow up. Patient accomplanied by his wife. Rm 4.      HISTORICAL  Cory Johnson is a 62 years old right-handed male, accompanied by his wife to follow-up for memory loss, history of REM sleep disorder,  He had a history of cervical fusion, due to advanced degenerative disc disease, presenting with progressive anterocollis, severe limited range of motion of his neck muscles  I saw him previously for  frequent nocturnal jerking movements.  He described that he had frequent transient jerking movements of his body while he was falling into sleep, he was able to quickly drifting back asleep again.   He also also has excessive movement  during the night sleep, kicking, moving his limbs, occasionally talking, cause a lot of complains from his wife.  he often dreamed of playing basketball.   He denied excessive snoring, or stop breathing during the the snoring.  He also complain excessive daytime fatigue, especially during the evening time, he would easily doze off sitting at the  meetings or watching TV.    He had sleep study in October 2013, which indeed demonstrate REM sleep disorder. There is no sleep apnea.  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 CT scan of the  brain was normal.  He continued to complains of neck pain, REM sleep disorder, frequent awakening during nighttime because of the neck pain, he is exercising regularly  Update June 29 2016, Last clinical visit was with Cory Johnson on June 09 2016, he noticed worsening memory loss, he had sudden worsening right hand tremor since March 2018, only mild left hand tremor, he denies significant gait abnormality, he does notice mild difficulties with smell, he still acts out of dreams, he is no longer taking clonazepam, complains of nauseous with clonazepam, he feels coldness in his fingers and toes,   He has lost weight in past few months, is 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,  He fell broke his left humeral in May 2018, did not have surgery,  We reviewed laboratory evaluation in April 2018, normal thyroid functional tests, CPK, ESR, B12, RPR, with mild elevated C reactive protein 14.2,  He continues to complaining worsening bilateral hands tremor right worse than left, dragging his foot across the floor, chronic constipation, but is on chronic narcotics treatment for his low back pain, neck pain, continue have REM sleep disorder, poor sleep, poor sense of smell, denied dizziness when getting up quickly,  UPDATE  May 08 2017: He has significant bilateral hands tremor, right worse than left, but denies loss sense of smell, continue to have mild memory loss, mild gait abnormality due to his neck flexion.  UPDATE September 17 2017: He was referred back by his primary care Dr. Carlota Johnson for evaluation of weight loss, about 20 pounds since January 2019 by reviewing the  chart,  Patient exercise daily, has mildly decreased appetite, frequent awakening at nighttime using bathroom, 1 reason  that they are more concerned about his weight is due to because he got schedule as Christmas gift, he has been taking his weight regularly. He actually had bigger weight fluctuation between May to October 2018, based on record.  He continue complains of bilateral hand shaking, denies significant gait abnormality, mild low back pain  Laboratory evaluation on August 30, 2017 showed normal CMP, prealbumin of 16, ESR of 5, C-reactive protein of 15, elevated PSA of 6.9, normal lipid profile, LDL of 86, normal CMP, TSH    UPDATE Sept 19 2019: He continues to have significant weight loss, he eats well, sleep well, continue have significant bilateral hands tremor.   Personally reviewed MRI brain in August 2019 from outside hospital showed mild generalized atrophy, small vessel disease.  For his tremor, we have tried Inderal without helping, previously tried to Sinemet and without significant improvement  Wife also emailed [place] earlier, complains that patient feel frustrated easily,   REVIEW OF SYSTEMS: Full 14 system review of systems performed and notable only for   All rest review of systems were negative.  ALLERGIES: No Known Allergies  HOME MEDICATIONS: Current Outpatient Medications  Medication Sig Dispense Refill  . HYDROcodone-acetaminophen (NORCO) 10-325 MG tablet Take 1 tablet by mouth every 6 (six) hours as needed (for pain).    . nitrofurantoin, macrocrystal-monohydrate, (MACROBID) 100 MG capsule Take 1 capsule (100 mg total) by mouth 2 (two) times daily. 14 capsule 0  . propranolol (INDERAL) 40 MG tablet Take 1 tablet (40 mg total) by mouth 2 (two) times daily. 180 tablet 4   No current facility-administered medications for this visit.     PAST MEDICAL HISTORY: Past Medical History:  Diagnosis Date  . Acute pulmonary embolus ([place]) 11/07/2009  . Arthritis     neck - limited range of motion; back, shoulders  . Chronic back pain   . Difficult intubation   . DVT of lower extremity (deep venous thrombosis) ([place]) 11/07/2009  . Occasional tremors 03/2017   HANDS   . Right inguinal hernia 02/2011  . Seizures ([place])   . Syncope and collapse 03/26/2017  . Umbilical hernia 41/7408    PAST SURGICAL HISTORY: Past Surgical History:  Procedure Laterality Date  . HERNIA REPAIR    . INGUINAL HERNIA REPAIR  03/17/2011   Procedure: HERNIA REPAIR INGUINAL ADULT;  Surgeon: Cory Hector, Cory Johnson;  Location: [facility];  Service: General;  Laterality: Right;  right inguinal hernia repair with mesh  . [place]   left  . UMBILICAL HERNIA REPAIR  03/17/2011   Procedure: HERNIA REPAIR UMBILICAL ADULT;  Surgeon: Cory Hector, Cory Johnson;  Location: [facility];  Service: General;  Laterality: N/A;  umbilical hernia repair with mesh    FAMILY HISTORY: Family History  Problem Relation Age of Onset  . Hypertension Other     SOCIAL HISTORY:  Social History   Socioeconomic History  . Marital status: Married    Spouse name: Cory Johnson  . Number of children: 5  .  Years of education: college  . Highest education level: Not on file  Occupational History  . Occupation: DIRECTOR    Employer: [employer]  . Financial resource strain: Not on file  . Food insecurity:    Worry: Not on file    Inability: Not on file  . Transportation needs:    Medical: Not on file    Non-medical: Not on file  Tobacco Use  . Smoking status: Never Smoker  . Smokeless tobacco: Never Used  Substance and Sexual Activity  . Alcohol use: No  . Drug use: No  . Sexual activity: Yes    Birth control/protection: Surgical  Lifestyle  . Physical activity:    Days per week: Not on file    Minutes per session: Not on file  . Stress: Not on file  Relationships  . Social connections:    Talks on phone: Not on file    Gets  together: Not on file    Attends religious service: Not on file    Active member of club or organization: Not on file    Attends meetings of clubs or organizations: Not on file    Relationship status: Not on file  . Intimate partner violence:    Fear of current or ex partner: Not on file    Emotionally abused: Not on file    Physically abused: Not on file    Forced sexual activity: Not on file  Other Topics Concern  . Not on file  Social History Narrative   Patient lives at home with his wife Cory Johnson).   Disabled.   Education college.   Right handed.   Caffeine one cup of coffee daily.     PHYSICAL EXAM   Vitals:   11/29/17 1300  BP: 125/84  Pulse: (!) 49  Weight: 174 lb (78.9 kg)  Height: _0  (1.88 m)    Not recorded      Body mass index is 22.34 kg/m.  PHYSICAL EXAMNIATION:  Gen: NAD, conversant, well nourised, obese, well groomed                     Cardiovascular: Regular rate rhythm, no peripheral edema, warm, nontender. Eyes: Conjunctivae clear without exudates or hemorrhage Neck: Supple, no carotid bruits. Pulmonary: Clear to auscultation bilaterally   NEUROLOGICAL EXAM:  MENTAL STATUS: animal naming 9 MMSE - Mini Mental State Exam 09/17/2017 05/08/2017 06/29/2016  Orientation to time _1 Orientation to Place _2 Registration _3 Attention/ Calculation _4 Recall 0 1 1  Language- name 2 objects _5 Language- repeat 1 0 1  Language- follow 3 step command _6 Language- read & follow direction _7 Write a sentence _8 Copy design _9 Total score _10 Animal naming 11.  CRANIAL NERVES: CN II: Visual fields are full to confrontation. Fundoscopic exam is normal with sharp discs and no vascular changes. Pupils are round equal and briskly reactive to light. CN III, IV, VI: extraocular movement are normal. No ptosis. CN V: Facial sensation is intact to pinprick in all 3 divisions bilaterally. Corneal responses are intact.    CN VII: Face is symmetric with normal eye closure and smile. CN VIII: Hearing is normal to rubbing fingers CN IX, X: Palate elevates symmetrically. Phonation is normal. CN XI: Head turning and shoulder shrug are  intact CN XII: Tongue is midline with normal movements and no atrophy.  MOTOR: He has bilateral hands posturing tremor, right worse than left, there is no significant rigidity or bradykinesia, he has limited range of motion of his neck muscles.  REFLEXES: Reflexes are 2+ and symmetric at the biceps, triceps, knees, and ankles. Plantar responses are flexor.  SENSORY: Intact to light touch, pinprick, positional sensation and vibratory sensation are intact in fingers and toes.  COORDINATION: Rapid alternating movements and fine finger movements are intact. There is no dysmetria on finger-to-nose and heel-knee-shin.    GAIT/STANCE: Neck flexion, steady gait  DIAGNOSTIC DATA (LABS, IMAGING, TESTING) - I reviewed patient records, labs, notes, testing and imaging myself where available.   ASSESSMENT AND PLAN  Cory Johnson is a 62 y.o. male   Long history of REM sleep disorder Fused cervical spine limited range of motion of cervical spine with fixed anterocollis Worsening right hand tremor  Mostly postural component, there was no significant parkinsonian features,  Likely a variation of essential tremor,  He has a short trial of Sinemet, which did not help his symptoms  Keep inderal 45m bid, potential side effect was explained, he has bradycardia, with heart rate of 50 beats per minutes,  [facility]   YMarcial Pacas M.D. Ph.D.  [facility] [address] [place] [address]Ph: [phone]: [phone] CC: Referring Provider

## 2017-11-29 NOTE — Telephone Encounter (Signed)
Patient is scheduled for 09/18/2018  at 9:20 am Telephone [phone]  Fax [phone] . They have no opening's at this time. Patient is on CX list. I asked For anther doctor also and they are booking into 2020.

## 2017-12-24 ENCOUNTER — Ambulatory Visit: Payer: 59 | Admitting: Neurology

## 2018-01-21 DIAGNOSIS — Z79891 Long term (current) use of opiate analgesic: Secondary | ICD-10-CM | POA: Diagnosis not present

## 2018-01-21 DIAGNOSIS — G894 Chronic pain syndrome: Secondary | ICD-10-CM | POA: Diagnosis not present

## 2018-03-16 DIAGNOSIS — R9431 Abnormal electrocardiogram [ECG] [EKG]: Secondary | ICD-10-CM | POA: Diagnosis not present

## 2018-03-16 DIAGNOSIS — R197 Diarrhea, unspecified: Secondary | ICD-10-CM | POA: Diagnosis not present

## 2018-03-16 DIAGNOSIS — R112 Nausea with vomiting, unspecified: Secondary | ICD-10-CM | POA: Diagnosis not present

## 2018-03-16 DIAGNOSIS — R002 Palpitations: Secondary | ICD-10-CM | POA: Diagnosis not present

## 2018-03-16 DIAGNOSIS — R001 Bradycardia, unspecified: Secondary | ICD-10-CM | POA: Diagnosis not present

## 2018-03-17 DIAGNOSIS — R9431 Abnormal electrocardiogram [ECG] [EKG]: Secondary | ICD-10-CM | POA: Diagnosis not present

## 2018-03-17 DIAGNOSIS — R001 Bradycardia, unspecified: Secondary | ICD-10-CM | POA: Diagnosis not present

## 2018-03-18 DIAGNOSIS — J301 Allergic rhinitis due to pollen: Secondary | ICD-10-CM | POA: Diagnosis not present

## 2018-03-18 DIAGNOSIS — J322 Chronic ethmoidal sinusitis: Secondary | ICD-10-CM | POA: Diagnosis not present

## 2018-03-18 DIAGNOSIS — J320 Chronic maxillary sinusitis: Secondary | ICD-10-CM | POA: Diagnosis not present

## 2018-04-08 DIAGNOSIS — H6122 Impacted cerumen, left ear: Secondary | ICD-10-CM | POA: Diagnosis not present

## 2018-05-20 DIAGNOSIS — M5136 Other intervertebral disc degeneration, lumbar region: Secondary | ICD-10-CM | POA: Diagnosis not present

## 2018-05-20 DIAGNOSIS — Z79891 Long term (current) use of opiate analgesic: Secondary | ICD-10-CM | POA: Diagnosis not present

## 2018-05-20 DIAGNOSIS — G894 Chronic pain syndrome: Secondary | ICD-10-CM | POA: Diagnosis not present

## 2018-06-21 ENCOUNTER — Emergency Department (HOSPITAL_COMMUNITY): Payer: 59

## 2018-06-21 ENCOUNTER — Other Ambulatory Visit: Payer: Self-pay

## 2018-06-21 ENCOUNTER — Emergency Department (HOSPITAL_COMMUNITY)
Admission: EM | Admit: 2018-06-21 | Discharge: 2018-06-21 | Disposition: A | Discharge status: Home / Self Care | Payer: 59 | Attending: Emergency Medicine | Admitting: Emergency Medicine

## 2018-06-21 ENCOUNTER — Encounter (HOSPITAL_COMMUNITY): Payer: Self-pay

## 2018-06-21 DIAGNOSIS — Y9389 Activity, other specified: Secondary | ICD-10-CM | POA: Insufficient documentation

## 2018-06-21 DIAGNOSIS — W312XXA Contact with powered woodworking and forming machines, initial encounter: Secondary | ICD-10-CM | POA: Insufficient documentation

## 2018-06-21 DIAGNOSIS — Y929 Unspecified place or not applicable: Secondary | ICD-10-CM | POA: Diagnosis not present

## 2018-06-21 DIAGNOSIS — S61211A Laceration without foreign body of left index finger without damage to nail, initial encounter: Secondary | ICD-10-CM | POA: Insufficient documentation

## 2018-06-21 DIAGNOSIS — S61215A Laceration without foreign body of left ring finger without damage to nail, initial encounter: Secondary | ICD-10-CM | POA: Insufficient documentation

## 2018-06-21 DIAGNOSIS — S6992XA Unspecified injury of left wrist, hand and finger(s), initial encounter: Secondary | ICD-10-CM | POA: Diagnosis present

## 2018-06-21 DIAGNOSIS — Y999 Unspecified external cause status: Secondary | ICD-10-CM | POA: Diagnosis not present

## 2018-06-21 DIAGNOSIS — S62624A Displaced fracture of medial phalanx of right ring finger, initial encounter for closed fracture: Secondary | ICD-10-CM | POA: Diagnosis not present

## 2018-06-21 DIAGNOSIS — S62651B Nondisplaced fracture of medial phalanx of left index finger, initial encounter for open fracture: Secondary | ICD-10-CM

## 2018-06-21 DIAGNOSIS — Z79899 Other long term (current) drug therapy: Secondary | ICD-10-CM | POA: Diagnosis not present

## 2018-06-21 DIAGNOSIS — S62651A Nondisplaced fracture of medial phalanx of left index finger, initial encounter for closed fracture: Secondary | ICD-10-CM | POA: Diagnosis not present

## 2018-06-21 DIAGNOSIS — Z23 Encounter for immunization: Secondary | ICD-10-CM | POA: Diagnosis not present

## 2018-06-21 MED ORDER — CEPHALEXIN 500 MG PO CAPS: 500.0000 mg | ORAL_CAPSULE | Freq: Four times a day (QID) | ORAL | 0 refills | Status: DC

## 2018-06-21 MED ORDER — MORPHINE SULFATE (PF) 4 MG/ML IV SOLN
4.0000 mg | Freq: Once | INTRAVENOUS | Status: AC
  Administered 2018-06-21: 4 mg via INTRAVENOUS
  Filled 2018-06-21: qty 1

## 2018-06-21 MED ORDER — LIDOCAINE HCL 2 % IJ SOLN
15.0000 mL | Freq: Once | INTRAMUSCULAR | Status: AC
  Administered 2018-06-21: 300 mg via INTRADERMAL
  Filled 2018-06-21: qty 20

## 2018-06-21 MED ORDER — TETANUS-DIPHTH-ACELL PERTUSSIS 5-2.5-18.5 LF-MCG/0.5 IM SUSP
0.5000 mL | Freq: Once | INTRAMUSCULAR | Status: AC
  Administered 2018-06-21: 19:00:00 0.5 mL via INTRAMUSCULAR
  Filled 2018-06-21: qty 0.5

## 2018-06-21 MED ORDER — CEFAZOLIN SODIUM-DEXTROSE 2-4 GM/100ML-% IV SOLN
2.0000 g | Freq: Once | INTRAVENOUS | Status: AC
  Administered 2018-06-21: 2 g via INTRAVENOUS
  Filled 2018-06-21: qty 100

## 2018-06-21 NOTE — ED Provider Notes (Signed)
[facility] EMERGENCY DEPARTMENT Provider Note   CSN: [id number] Arrival date & time: 06/21/18  4098    History   Chief Complaint Chief Complaint  Patient presents with  . Finger Injury    HPI Cory Johnson is a 63 y.o. male.     HPI   63 year old male with a history of PE, arthritis, DVT, tremors, seizures, presented emergency department today for evaluation of left hand injury.  Patient was using a battery-powered saw and cutting some wood when the saw slipped and cut his left index finger and left fourth finger on the palmar aspect.  He has some numbness to the distal aspect of his fingertips.  He has decreased range of motion.  Pain rated 10/10 currently.  Started suddenly.  Has been constant since onset.  He is unsure of when his last Tdap was.  Patient is not on blood thinners.  Past Medical History:  Diagnosis Date  . Acute pulmonary embolus (HCC) 11/07/2009  . Arthritis    neck - limited range of motion; back, shoulders  . Chronic back pain   . Difficult intubation   . DVT of lower extremity (deep venous thrombosis) (HCC) 11/07/2009  . Occasional tremors 03/2017   HANDS   . Right inguinal hernia 02/2011  . Seizures (HCC)   . Syncope and collapse 03/26/2017  . Umbilical hernia 02/2011    Patient Active Problem List   Diagnosis Date Noted  . 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 REM sleep behavior disorder 09/17/2017  . Tremor 05/08/2017  . Gait abnormality 05/08/2017  . Transient hypotension 05/05/2017  . Malnutrition of moderate degree 03/28/2017  . Syncope and collapse 03/26/2017  . Subungual hematoma of great toe of right foot 12/13/2016  . Pain of toe of right foot 12/13/2016  . Paresthesia 06/29/2016  . Tremor of right hand 06/29/2016  . Mild cognitive impairment 06/29/2016  . Tremors of nervous system 06/22/2016  . Drooling 06/22/2016  . Loss of weight 06/22/2016  . Family history of throat  cancer 06/22/2016  . Abnormality of gait 06/09/2016  . Acute bronchitis 03/20/2016  . Memory loss 12/31/2013  . Back pain 08/18/2013  . Arthritis   . Myoclonic jerking   . Right inguinal hernia 03/17/2011  . Umbilical hernia 03/17/2011  . Acute pulmonary embolus (HCC) 11/07/2009  . DVT of lower extremity (deep venous thrombosis) (HCC) 11/07/2009    Past Surgical History:  Procedure Laterality Date  . HERNIA REPAIR    . INGUINAL HERNIA REPAIR  03/17/2011   Procedure: HERNIA REPAIR INGUINAL ADULT;  Surgeon: Ernestene Mention, MD;  Location: [facility];  Service: General;  Laterality: Right;  right inguinal hernia repair with mesh  . KNEE SURGERY  1977   left  . UMBILICAL HERNIA REPAIR  03/17/2011   Procedure: HERNIA REPAIR UMBILICAL ADULT;  Surgeon: Ernestene Mention, MD;  Location: [facility];  Service: General;  Laterality: N/A;  umbilical hernia repair with mesh        Home Medications    Prior to Admission medications   Medication Sig Start Date End Date Taking? Authorizing Provider  cephALEXin (KEFLEX) 500 MG capsule Take 1 capsule (500 mg total) by mouth 4 (four) times daily for 7 days. 06/21/18 06/28/18  Catlynn Grondahl S, PA-C  HYDROcodone-acetaminophen (NORCO) 10-325 MG tablet Take 1 tablet by mouth every 6 (six) hours as needed (for pain).    [provider]  nitrofurantoin, macrocrystal-monohydrate, (  MACROBID) 100 MG capsule Take 1 capsule (100 mg total) by mouth 2 (two) times daily. 10/17/17   Shade Flood, MD  propranolol (INDERAL) 40 MG tablet Take 1 tablet (40 mg total) by mouth 2 (two) times daily. 09/17/17   Levert Feinstein, MD    Family History Family History  Problem Relation Age of Onset  . Hypertension Other     Social History Social History   Tobacco Use  . Smoking status: Never Smoker  . Smokeless tobacco: Never Used  Substance Use Topics  . Alcohol use: No  . Drug use: No     Allergies   Patient has no known  allergies.   Review of Systems Review of Systems   Physical Exam Updated Vital Signs BP 133/88   Pulse 75   Temp 98.8 F (37.1 C) (Oral)   Resp 16   Ht 6\' 3"  (1.905 m)   Wt 81.6 kg   SpO2 98%   BMI 22.50 kg/m   Physical Exam Musculoskeletal:     Comments: 1.5 cm jagged laceration to the left index finger on the palmar side.  Unable to flex finger at the DIP joint.  Decreased sensation distal to the wound.  No active bleeding.  Brisk cap refill.  Superficial abrasion to left long finger.  Neurovascularly intact distally.  Normal range of motion.  1 cm laceration to the distal aspect of the left fourth finger.  Range of motion intact at the PIP, DIP and MCP joints.  Minimally decreased sensation distal to the wound.        ED Treatments / Results  Labs (all labs ordered are listed, but only abnormal results are displayed) Labs Reviewed - No data to display  EKG None  Radiology Dg Hand Complete Left  Result Date: 06/21/2018 CLINICAL DATA:  Left index finger laceration EXAM: LEFT HAND - COMPLETE 3+ VIEW COMPARISON:  None. FINDINGS: Large laceration at the ventral surface of the left index finger. There is a minimally displaced oblique fracture of the middle phalanx of the index finger. No other osseous abnormality. No radiopaque foreign body. IMPRESSION: 1. Minimally displaced, predominantly oblique, comminuted fracture of the middle phalanx of the left index finger. 2. Large volar laceration of the index finger. 3. No radiopaque foreign body. Electronically Signed   By: Deatra Robinson M.D.   On: 06/21/2018 20:37    Procedures .Marland KitchenLaceration Repair Date/Time: 06/21/2018 11:09 PM Performed by: Karrie Meres, PA-C Authorized by: Karrie Meres, PA-C   Consent:    Consent obtained:  Verbal   Consent given by:  Patient   Risks discussed:  Infection, pain, poor cosmetic result and need for additional repair   Alternatives discussed:  No treatment Anesthesia (see MAR  for exact dosages):    Anesthesia method:  Local infiltration and nerve block   Local anesthetic:  Lidocaine 2% w/o epi   Block needle gauge:  27 G   Block anesthetic:  Lidocaine 2% w/o epi   Block injection procedure:  Anatomic landmarks identified, introduced needle, incremental injection, negative aspiration for blood and anatomic landmarks palpated   Block outcome:  Incomplete block Laceration details:    Location:  Finger   Finger location:  L index finger   Length (cm):  2 Repair type:    Repair type:  Simple Pre-procedure details:    Preparation:  Patient was prepped and draped in usual sterile fashion and imaging obtained to evaluate for foreign bodies Exploration:    Hemostasis achieved with:  Tourniquet and direct pressure   Wound exploration: wound explored through full range of motion and entire depth of wound probed and visualized     Wound extent: nerve damage, tendon damage and underlying fracture     Wound extent: no foreign bodies/material noted and no vascular damage noted     Tendon repair plan:  Refer for evaluation   Contaminated: no   Treatment:    Area cleansed with:  Saline   Amount of cleaning:  Extensive   Irrigation solution:  Sterile saline   Irrigation method:  Pressure wash Skin repair:    Repair method:  Sutures   Suture size:  5-0   Suture material:  Plain gut   Suture technique:  Simple interrupted   Number of sutures:  4 Approximation:    Approximation:  Close Post-procedure details:    Dressing:  Sterile dressing and splint for protection   Patient tolerance of procedure:  Tolerated well, no immediate complications .Marland Kitchen.Laceration Repair Date/Time: 06/21/2018 11:10 PM Performed by: Karrie Meresouture, Tama Grosz S, PA-C Authorized by: Karrie Meresouture, Caterra Ostroff S, PA-C   Consent:    Consent obtained:  Verbal   Consent given by:  Patient   Risks discussed:  Infection, pain and retained foreign body   Alternatives discussed:  No treatment Anesthesia (see MAR for  exact dosages):    Anesthesia method:  Nerve block   Block needle gauge:  27 G   Block anesthetic:  Lidocaine 2% w/o epi   Block injection procedure:  Anatomic landmarks identified, introduced needle, incremental injection, negative aspiration for blood and anatomic landmarks palpated   Block outcome:  Anesthesia achieved Laceration details:    Location:  Finger   Finger location:  L long finger   Length (cm):  1 Repair type:    Repair type:  Simple Pre-procedure details:    Preparation:  Patient was prepped and draped in usual sterile fashion and imaging obtained to evaluate for foreign bodies Exploration:    Hemostasis achieved with:  Direct pressure   Wound exploration: wound explored through full range of motion and entire depth of wound probed and visualized     Wound extent: no foreign bodies/material noted, no nerve damage noted, no tendon damage noted, no underlying fracture noted and no vascular damage noted     Contaminated: no   Treatment:    Area cleansed with:  Saline   Amount of cleaning:  Standard   Irrigation solution:  Sterile saline   Irrigation method:  Pressure wash   Visualized foreign bodies/material removed: no   Skin repair:    Repair method:  Sutures   Suture size:  5-0   Suture material:  Plain gut   Suture technique:  Simple interrupted   Number of sutures:  1 Approximation:    Approximation:  Close Post-procedure details:    Dressing:  Open (no dressing)   Patient tolerance of procedure:  Tolerated well, no immediate complications   (including critical care time)  Medications Ordered in ED Medications  Tdap (BOOSTRIX) injection 0.5 mL (0.5 mLs Intramuscular Given 06/21/18 1902)  ceFAZolin (ANCEF) IVPB 2g/100 mL premix (0 g Intravenous Stopped 06/21/18 1932)  morphine 4 MG/ML injection 4 mg (4 mg Intravenous Given 06/21/18 1857)  lidocaine (XYLOCAINE) 2 % (with pres) injection 300 mg (300 mg Intradermal Given by Other 06/21/18 2145)     Initial  Impression / Assessment and Plan / ED Course  I have reviewed the triage vital signs and the nursing notes.  Pertinent labs &  imaging results that were available during my care of the patient were reviewed by me and considered in my medical decision making (see chart for details).    Final Clinical Impressions(s) / ED Diagnoses   Final diagnoses:  Laceration of left index finger without foreign body without damage to nail, initial encounter  Laceration of left ring finger without foreign body without damage to nail, initial encounter  Open nondisplaced fracture of middle phalanx of left index finger, initial encounter   Patient using saw prior to arrival when he cut his fingers with table saw pta.  Has multiple lacerations including deep laceration to the left index finger and another laceration to the left fourth finger.  Index finger laceration is deep and I suspect tendon and nerve involvement.  Doubt vascular involvement.  X-ray shows minimally displaced, predominantly oblique and comminuted fracture of the middle phalanx of the left index finger.  No bony involvement of the left fourth digit.  2 g Ancef given in the ED.  Tdap was updated.  Wounds were thoroughly irrigated with normal saline.    I discussed case with Dr. Roney Mans with hand surgery who recommended loose closure with suturing, antibiotics and splinting of the left index finger.  Lacerations were repaired in the ED.  Dressing and Coban was applied to the wound.  Splint was ordered for the patient.  After patient was discharged I was informed by nursing staff that splint was not placed on the patient's finger however wound dressing was bulky and did not allow movement of the finger.  I gave patient strict instructions to take antibiotics as directed given he has open fracture.  I gave him information to follow-up with hand surgery.  Advised on strict return precautions for new or worsening symptoms.  He voices understanding of the  plan and reasons to return to the ED.  All questions answered.  Patient stable for discharge.  ED Discharge Orders         Ordered    cephALEXin (KEFLEX) 500 MG capsule  4 times daily     06/21/18 2308           Rayne Du 06/22/18 Marveen Reeks, MD 06/22/18 [phone]

## 2018-06-21 NOTE — ED Triage Notes (Addendum)
Pt from home; pt using battery powered saw for wood working, saw slipped and cut index, middle, and third finger of left hand; large laceration to L index finger; bleeding controlled; pt tremoring, says this is normal for him

## 2018-06-21 NOTE — Discharge Instructions (Addendum)
You were given a prescription for antibiotics. Please take the antibiotic prescription fully.   You were given a referral to a hand surgeon.  Please call the office on Monday to make an appointment for follow-up next week.  Please return to the emergency room immediately if you experience any new or worsening symptoms or any symptoms that indicate worsening infection such as fevers, increased redness/swelling/pain, warmth, or drainage from the affected area.

## 2018-06-21 NOTE — ED Notes (Signed)
E-signature not available, verbalized understanding of DC instructions and prescriptions.  

## 2018-06-25 ENCOUNTER — Other Ambulatory Visit: Payer: Self-pay

## 2018-06-25 ENCOUNTER — Encounter (HOSPITAL_BASED_OUTPATIENT_CLINIC_OR_DEPARTMENT_OTHER): Payer: Self-pay

## 2018-06-25 DIAGNOSIS — S62621B Displaced fracture of medial phalanx of left index finger, initial encounter for open fracture: Secondary | ICD-10-CM | POA: Diagnosis not present

## 2018-06-25 DIAGNOSIS — M79642 Pain in left hand: Secondary | ICD-10-CM | POA: Diagnosis not present

## 2018-06-25 NOTE — Progress Notes (Signed)
Anesthesia consult performed by Dr. Arby Barrette for history of difficult airway/intubation due to severe arthritis in his neck. Surgery approved.

## 2018-06-25 NOTE — Anesthesia Preprocedure Evaluation (Addendum)
Anesthesia Evaluation  Patient identified by MRN, date of birth, ID band Patient awake  General Assessment Comment:INTUBATION 13' Laryngoscope size: Glidescope number 4. Grade View: Grade II Tube type: Oral Number of attempts: 1 Airway Equipment and Method: stylet,  oral airway and video-laryngoscopy Placement Confirmation: ETT inserted through vocal cords under direct vision,  positive ETCO2 and breath sounds checked- equal and bilateral Secured at: 23 cm Tube secured with: Tape Dental Injury: Teeth and Oropharynx as per pre-operative assessment  Difficulty Due To: Difficult Airway- due to reduced neck mobility  Reviewed: Allergy & Precautions, H&P , NPO status , Patient's Chart, lab work & pertinent test results  History of Anesthesia Complications (+) DIFFICULT AIRWAY and history of anesthetic complications  Airway Mallampati: III  TM Distance: >3 FB Neck ROM: Limited   Comment: NO  ROM Dental no notable dental hx. (+) Missing, Poor Dentition NO ROM:   Pulmonary neg pulmonary ROS,    Pulmonary exam normal breath sounds clear to auscultation       Cardiovascular Exercise Tolerance: Good negative cardio ROS Normal cardiovascular exam Rhythm:Regular Rate:Normal     Neuro/Psych Seizures -, Well Controlled,  negative neurological ROS  negative psych ROS   GI/Hepatic negative GI ROS, Neg liver ROS,   Endo/Other  negative endocrine ROS  Renal/GU negative Renal ROS  negative genitourinary   Musculoskeletal negative musculoskeletal ROS (+) Arthritis , Osteoarthritis,    Abdominal   Peds negative pediatric ROS (+)  Hematology negative hematology ROS (+)   Anesthesia Other Findings Severe arthritis limited neck mobility, III airway   Reproductive/Obstetrics negative OB ROS                          Anesthesia Physical Anesthesia Plan  ASA: III  Anesthesia Plan: MAC and Regional   Post-op  Pain Management:  Regional for Post-op pain   Induction:   PONV Risk Score and Plan:   Airway Management Planned: Nasal Cannula and Simple Face Mask  Additional Equipment:   Intra-op Plan:   Post-operative Plan:   Informed Consent: I have reviewed the patients History and Physical, chart, labs and discussed the procedure including the risks, benefits and alternatives for the proposed anesthesia with the patient or authorized representative who has indicated his/her understanding and acceptance.       Plan Discussed with: CRNA, Anesthesiologist and Surgeon  Anesthesia Plan Comments: (Discussed with patient and wife that regional block with MAC is the best alternative for finger surgery as it avoids any airway manipulation potentially and can help with postoperative pain as well. They understand and gave their consent to this plan verbally)       Anesthesia Quick Evaluation

## 2018-06-26 ENCOUNTER — Ambulatory Visit (HOSPITAL_BASED_OUTPATIENT_CLINIC_OR_DEPARTMENT_OTHER): Payer: 59 | Admitting: Anesthesiology

## 2018-06-26 ENCOUNTER — Encounter (HOSPITAL_BASED_OUTPATIENT_CLINIC_OR_DEPARTMENT_OTHER)
Admission: RE | Disposition: A | Discharge status: Home / Self Care | Payer: Self-pay | Source: Home / Self Care | Attending: Orthopaedic Surgery

## 2018-06-26 ENCOUNTER — Encounter (HOSPITAL_BASED_OUTPATIENT_CLINIC_OR_DEPARTMENT_OTHER): Payer: Self-pay

## 2018-06-26 ENCOUNTER — Ambulatory Visit (HOSPITAL_BASED_OUTPATIENT_CLINIC_OR_DEPARTMENT_OTHER)
Admission: RE | Admit: 2018-06-26 | Discharge: 2018-06-26 | Disposition: A | Discharge status: Home / Self Care | Payer: 59 | Attending: Orthopaedic Surgery | Admitting: Orthopaedic Surgery

## 2018-06-26 DIAGNOSIS — S66121A Laceration of flexor muscle, fascia and tendon of left index finger at wrist and hand level, initial encounter: Secondary | ICD-10-CM | POA: Diagnosis not present

## 2018-06-26 DIAGNOSIS — Z86718 Personal history of other venous thrombosis and embolism: Secondary | ICD-10-CM | POA: Diagnosis not present

## 2018-06-26 DIAGNOSIS — S62621A Displaced fracture of medial phalanx of left index finger, initial encounter for closed fracture: Secondary | ICD-10-CM | POA: Diagnosis not present

## 2018-06-26 DIAGNOSIS — Z8249 Family history of ischemic heart disease and other diseases of the circulatory system: Secondary | ICD-10-CM | POA: Diagnosis not present

## 2018-06-26 DIAGNOSIS — S62621B Displaced fracture of medial phalanx of left index finger, initial encounter for open fracture: Secondary | ICD-10-CM | POA: Insufficient documentation

## 2018-06-26 DIAGNOSIS — G8929 Other chronic pain: Secondary | ICD-10-CM | POA: Diagnosis not present

## 2018-06-26 DIAGNOSIS — Z86711 Personal history of pulmonary embolism: Secondary | ICD-10-CM | POA: Insufficient documentation

## 2018-06-26 DIAGNOSIS — S6422XA Injury of radial nerve at wrist and hand level of left arm, initial encounter: Secondary | ICD-10-CM | POA: Diagnosis not present

## 2018-06-26 DIAGNOSIS — M1990 Unspecified osteoarthritis, unspecified site: Secondary | ICD-10-CM | POA: Insufficient documentation

## 2018-06-26 DIAGNOSIS — S64491A Injury of digital nerve of left index finger, initial encounter: Secondary | ICD-10-CM | POA: Diagnosis not present

## 2018-06-26 DIAGNOSIS — M549 Dorsalgia, unspecified: Secondary | ICD-10-CM | POA: Insufficient documentation

## 2018-06-26 DIAGNOSIS — R569 Unspecified convulsions: Secondary | ICD-10-CM | POA: Diagnosis not present

## 2018-06-26 DIAGNOSIS — W312XXA Contact with powered woodworking and forming machines, initial encounter: Secondary | ICD-10-CM | POA: Diagnosis not present

## 2018-06-26 HISTORY — PX: CLOSED REDUCTION FINGER WITH PERCUTANEOUS PINNING: SHX5612

## 2018-06-26 HISTORY — PX: I&D EXTREMITY: SHX5045

## 2018-06-26 HISTORY — PX: NERVE AND TENDON REPAIR: SHX5693

## 2018-06-26 SURGERY — IRRIGATION AND DEBRIDEMENT EXTREMITY
Anesthesia: Monitor Anesthesia Care | Laterality: Left

## 2018-06-26 MED ORDER — ACETAMINOPHEN 160 MG/5ML PO SOLN: 325.0000 mg | ORAL | Status: DC | PRN

## 2018-06-26 MED ORDER — MIDAZOLAM HCL 2 MG/2ML IJ SOLN
1.0000 mg | INTRAMUSCULAR | Status: DC | PRN
  Administered 2018-06-26: 1 mg via INTRAVENOUS

## 2018-06-26 MED ORDER — PROPOFOL 500 MG/50ML IV EMUL
INTRAVENOUS | Status: AC
  Filled 2018-06-26: qty 50

## 2018-06-26 MED ORDER — MEPERIDINE HCL 25 MG/ML IJ SOLN: 6.2500 mg | INTRAMUSCULAR | Status: DC | PRN

## 2018-06-26 MED ORDER — LIDOCAINE 2% (20 MG/ML) 5 ML SYRINGE
INTRAMUSCULAR | Status: AC
  Filled 2018-06-26: qty 5

## 2018-06-26 MED ORDER — PROPOFOL 500 MG/50ML IV EMUL
INTRAVENOUS | Status: DC | PRN
  Administered 2018-06-26: 25 ug/kg/min via INTRAVENOUS

## 2018-06-26 MED ORDER — ONDANSETRON HCL 4 MG/2ML IJ SOLN: 4.0000 mg | Freq: Once | INTRAMUSCULAR | Status: DC | PRN

## 2018-06-26 MED ORDER — CEFAZOLIN SODIUM-DEXTROSE 2-4 GM/100ML-% IV SOLN
2.0000 g | INTRAVENOUS | Status: AC
  Administered 2018-06-26: 2 g via INTRAVENOUS

## 2018-06-26 MED ORDER — KETOROLAC TROMETHAMINE 30 MG/ML IJ SOLN
INTRAMUSCULAR | Status: AC
  Filled 2018-06-26: qty 1

## 2018-06-26 MED ORDER — HYDROCODONE-ACETAMINOPHEN 5-325 MG PO TABS: 1.0000 | ORAL_TABLET | Freq: Four times a day (QID) | ORAL | 0 refills | Status: AC | PRN

## 2018-06-26 MED ORDER — SCOPOLAMINE 1 MG/3DAYS TD PT72: 1.0000 | MEDICATED_PATCH | Freq: Once | TRANSDERMAL | Status: DC | PRN

## 2018-06-26 MED ORDER — ACETAMINOPHEN 325 MG PO TABS: 325.0000 mg | ORAL_TABLET | ORAL | Status: DC | PRN

## 2018-06-26 MED ORDER — OXYCODONE HCL 5 MG/5ML PO SOLN: 5.0000 mg | Freq: Once | ORAL | Status: DC | PRN

## 2018-06-26 MED ORDER — FENTANYL CITRATE (PF) 100 MCG/2ML IJ SOLN
INTRAMUSCULAR | Status: AC
  Filled 2018-06-26: qty 2

## 2018-06-26 MED ORDER — FENTANYL CITRATE (PF) 100 MCG/2ML IJ SOLN: 25.0000 ug | INTRAMUSCULAR | Status: DC | PRN

## 2018-06-26 MED ORDER — MIDAZOLAM HCL 2 MG/2ML IJ SOLN
INTRAMUSCULAR | Status: AC
  Filled 2018-06-26: qty 2

## 2018-06-26 MED ORDER — FENTANYL CITRATE (PF) 100 MCG/2ML IJ SOLN
50.0000 ug | INTRAMUSCULAR | Status: DC | PRN
  Administered 2018-06-26: 100 ug via INTRAVENOUS

## 2018-06-26 MED ORDER — CLONIDINE HCL (ANALGESIA) 100 MCG/ML EP SOLN
EPIDURAL | Status: DC | PRN
  Administered 2018-06-26: 100 ug

## 2018-06-26 MED ORDER — LACTATED RINGERS IV SOLN
INTRAVENOUS | Status: DC
  Administered 2018-06-26: 08:00:00 via INTRAVENOUS

## 2018-06-26 MED ORDER — LIDOCAINE 2% (20 MG/ML) 5 ML SYRINGE
INTRAMUSCULAR | Status: DC | PRN
  Administered 2018-06-26: 25 mg via INTRAVENOUS

## 2018-06-26 MED ORDER — LIDOCAINE HCL (PF) 1 % IJ SOLN
INTRAMUSCULAR | Status: AC
  Filled 2018-06-26: qty 30

## 2018-06-26 MED ORDER — CEFAZOLIN SODIUM-DEXTROSE 2-4 GM/100ML-% IV SOLN
INTRAVENOUS | Status: AC
  Filled 2018-06-26: qty 100

## 2018-06-26 MED ORDER — ROPIVACAINE HCL 7.5 MG/ML IJ SOLN
INTRAMUSCULAR | Status: DC | PRN
  Administered 2018-06-26: 20 mL via PERINEURAL

## 2018-06-26 MED ORDER — CHLORHEXIDINE GLUCONATE 4 % EX LIQD: 60.0000 mL | Freq: Once | CUTANEOUS | Status: DC

## 2018-06-26 MED ORDER — OXYCODONE HCL 5 MG PO TABS: 5.0000 mg | ORAL_TABLET | Freq: Once | ORAL | Status: DC | PRN

## 2018-06-26 MED ORDER — CEPHALEXIN 500 MG PO CAPS: 500.0000 mg | ORAL_CAPSULE | Freq: Four times a day (QID) | ORAL | 0 refills | Status: AC

## 2018-06-26 SURGICAL SUPPLY — 57 items
BANDAGE ACE 3X5.8 VEL STRL LF (GAUZE/BANDAGES/DRESSINGS) ×3 IMPLANT
BLADE SURG 15 STRL LF DISP TIS (BLADE) ×2 IMPLANT
BLADE SURG 15 STRL SS (BLADE) ×4
BNDG CONFORM 2 STRL LF (GAUZE/BANDAGES/DRESSINGS) IMPLANT
BNDG ESMARK 4X9 LF (GAUZE/BANDAGES/DRESSINGS) ×3 IMPLANT
BNDG GAUZE ELAST 4 BULKY (GAUZE/BANDAGES/DRESSINGS) ×3 IMPLANT
CANISTER SUCT 1200ML W/VALVE (MISCELLANEOUS) ×3 IMPLANT
CAP PIN PROTECTOR ORTHO WHT (CAP) ×3 IMPLANT
CONNECTOR NERVE AXOGARD 1.5X15 (Nerve Graft) ×3 IMPLANT
CORD BIPOLAR FORCEPS 12FT (ELECTRODE) ×3 IMPLANT
COVER BACK TABLE REUSABLE LG (DRAPES) ×3 IMPLANT
COVER WAND RF STERILE (DRAPES) IMPLANT
CUFF TOURN SGL QUICK 18X4 (TOURNIQUET CUFF) ×3 IMPLANT
DECANTER SPIKE VIAL GLASS SM (MISCELLANEOUS) IMPLANT
DRAPE EXTREMITY T 121X128X90 (DISPOSABLE) ×3 IMPLANT
DRAPE SURG 17X23 STRL (DRAPES) ×3 IMPLANT
GAUZE 4X4 16PLY RFD (DISPOSABLE) ×3 IMPLANT
GAUZE SPONGE 4X4 12PLY STRL LF (GAUZE/BANDAGES/DRESSINGS) ×3 IMPLANT
GAUZE XEROFORM 1X8 LF (GAUZE/BANDAGES/DRESSINGS) ×3 IMPLANT
GLOVE BIOGEL PI IND STRL 8 (GLOVE) IMPLANT
GLOVE BIOGEL PI INDICATOR 8 (GLOVE)
GLOVE SURG SYN 7.5  E (GLOVE)
GLOVE SURG SYN 7.5 E (GLOVE) IMPLANT
GOWN STRL REUS W/ TWL LRG LVL3 (GOWN DISPOSABLE) ×2 IMPLANT
GOWN STRL REUS W/TWL LRG LVL3 (GOWN DISPOSABLE) ×4
K-WIRE .035X4 (WIRE) ×6 IMPLANT
NEEDLE HYPO 22GX1.5 SAFETY (NEEDLE) IMPLANT
NEEDLE HYPO 25X1 1.5 SAFETY (NEEDLE) ×3 IMPLANT
NS IRRIG 1000ML POUR BTL (IV SOLUTION) ×3 IMPLANT
PACK BASIN DAY SURGERY FS (CUSTOM PROCEDURE TRAY) ×3 IMPLANT
PADDING CAST ABS 3INX4YD NS (CAST SUPPLIES) ×2
PADDING CAST ABS 4INX4YD NS (CAST SUPPLIES)
PADDING CAST ABS COTTON 3X4 (CAST SUPPLIES) ×1 IMPLANT
PADDING CAST ABS COTTON 4X4 ST (CAST SUPPLIES) IMPLANT
SHEET MEDIUM DRAPE 40X70 STRL (DRAPES) ×3 IMPLANT
SLEEVE SCD COMPRESS KNEE MED (MISCELLANEOUS) ×3 IMPLANT
SLING ARM FOAM STRAP LRG (SOFTGOODS) ×3 IMPLANT
SPLINT FIBERGLASS 3X35 (CAST SUPPLIES) ×3 IMPLANT
SPLINT PLASTER CAST XFAST 3X15 (CAST SUPPLIES) ×1 IMPLANT
SPLINT PLASTER XTRA FASTSET 3X (CAST SUPPLIES) ×2
STOCKINETTE 4X48 STRL (DRAPES) ×3 IMPLANT
STOCKINETTE SYNTHETIC 3 UNSTER (CAST SUPPLIES) ×3 IMPLANT
SUCTION FRAZIER HANDLE 10FR (MISCELLANEOUS) ×2
SUCTION TUBE FRAZIER 10FR DISP (MISCELLANEOUS) ×1 IMPLANT
SUT ETHIBOND 3-0 V-5 (SUTURE) ×3 IMPLANT
SUT ETHILON 6 0 P 1 (SUTURE) ×3 IMPLANT
SUT ETHILON 8 0 BV130 4 (SUTURE) ×3 IMPLANT
SUT PROLENE 4 0 PS 2 18 (SUTURE) ×9 IMPLANT
SUT SUPRAMID 3-0 (SUTURE) ×6 IMPLANT
SYR BULB 3OZ (MISCELLANEOUS) ×3 IMPLANT
SYR CONTROL 10ML LL (SYRINGE) ×6 IMPLANT
TAPE SURG TRANSPORE 1 IN (GAUZE/BANDAGES/DRESSINGS) ×1 IMPLANT
TAPE SURGICAL TRANSPORE 1 IN (GAUZE/BANDAGES/DRESSINGS) ×2
TOWEL GREEN STERILE FF (TOWEL DISPOSABLE) ×6 IMPLANT
TUBE CONNECTING 20'X1/4 (TUBING) ×1
TUBE CONNECTING 20X1/4 (TUBING) ×2 IMPLANT
UNDERPAD 30X30 (UNDERPADS AND DIAPERS) ×3 IMPLANT

## 2018-06-26 NOTE — Progress Notes (Signed)
Assisted Dr. Tacy Dura with left ultra sound guideded supraclavicular block. Side rails up, monitors on throughout procedure. See vital signs in flow sheet. Tolerated Procedure well.

## 2018-06-26 NOTE — Discharge Instructions (Signed)
Discharge Instructions  - Keep dressings in place. Do not remove them. - The dressings must stay dry - Take all medication as prescribed. Transition to over the counter pain medication as your pain improves - Keep the hand elevated over the next 48-72 hours to help with pain and swelling - Move all digits not restricted by the dressings regularly to prevent stiffness - Please call to schedule a follow up appointment with Dr. Roney Mans at [phone] for 5-7days following surgery    Post Anesthesia Home Care Instructions  Activity: Get plenty of rest for the remainder of the day. A responsible individual must stay with you for 24 hours following the procedure.  For the next 24 hours, DO NOT: -Drive a car -Advertising copywriter -Drink alcoholic beverages -Take any medication unless instructed by your physician -Make any legal decisions or sign important papers.  Meals: Start with liquid foods such as gelatin or soup. Progress to regular foods as tolerated. Avoid greasy, spicy, heavy foods. If nausea and/or vomiting occur, drink only clear liquids until the nausea and/or vomiting subsides. Call your physician if vomiting continues.  Special Instructions/Symptoms: Your throat may feel dry or sore from the anesthesia or the breathing tube placed in your throat during surgery. If this causes discomfort, gargle with warm salt water. The discomfort should disappear within 24 hours.  If you had a scopolamine patch placed behind your ear for the management of post- operative nausea and/or vomiting:  1. The medication in the patch is effective for 72 hours, after which it should be removed.  Wrap patch in a tissue and discard in the trash. Wash hands thoroughly with soap and water. 2. You may remove the patch earlier than 72 hours if you experience unpleasant side effects which may include dry mouth, dizziness or visual disturbances. 3. Avoid touching the patch. Wash your hands with soap and  water after contact with the patch.        Regional Anesthesia Blocks  1. Numbness or the inability to move the "blocked" extremity may last from 3-48 hours after placement. The length of time depends on the medication injected and your individual response to the medication. If the numbness is not going away after 48 hours, call your surgeon.  2. The extremity that is blocked will need to be protected until the numbness is gone and the  Strength has returned. Because you cannot feel it, you will need to take extra care to avoid injury. Because it may be weak, you may have difficulty moving it or using it. You may not know what position it is in without looking at it while the block is in effect.  3. For blocks in the legs and feet, returning to weight bearing and walking needs to be done carefully. You will need to wait until the numbness is entirely gone and the strength has returned. You should be able to move your leg and foot normally before you try and bear weight or walk. You will need someone to be with you when you first try to ensure you do not fall and possibly risk injury.  4. Bruising and tenderness at the needle site are common side effects and will resolve in a few days.  5. Persistent numbness or new problems with movement should be communicated to the surgeon or the [facility] [phone] [facility] [phone]).

## 2018-06-26 NOTE — Transfer of Care (Signed)
Immediate Anesthesia Transfer of Care Note  Patient: Cory Johnson  Procedure(s) Performed: Left Index finger wound exploration, irrigation and debridement, fracture fixation, flexor tendon repair, digital nerve repair with conduit as needed and surgery as indicated (Left )  Patient Location: PACU  Anesthesia Type:MAC combined with regional for post-op pain  Level of Consciousness: sedated  Airway & Oxygen Therapy: Patient Spontanous Breathing and Patient connected to face mask oxygen  Post-op Assessment: Report given to RN and Post -op Vital signs reviewed and stable  Post vital signs: Reviewed and stable  Last Vitals:  Vitals Value Taken Time  BP 136/102 06/26/2018 10:45 AM  Temp    Pulse 52 06/26/2018 10:46 AM  Resp    SpO2 100 % 06/26/2018 10:46 AM  Vitals shown include unvalidated device data.  Last Pain:  Vitals:   06/26/18 0739  TempSrc: Oral  PainSc: 9       Patients Stated Pain Goal: 9 (65/53/74 8270)  Complications: No apparent anesthesia complications

## 2018-06-26 NOTE — Anesthesia Procedure Notes (Signed)
Procedure Name: MAC Date/Time: 06/26/2018 8:35 AM Performed by: Bonney Aid, CRNA Pre-anesthesia Checklist: Patient identified, Emergency Drugs available, Suction available, Patient being monitored and Timeout performed Patient Re-evaluated:Patient Re-evaluated prior to induction Oxygen Delivery Method: Simple face mask Placement Confirmation: positive ETCO2

## 2018-06-26 NOTE — Anesthesia Procedure Notes (Signed)
Anesthesia Regional Block: Supraclavicular block   Pre-Anesthetic Checklist: ,, timeout performed, Correct Patient, Correct Site, Correct Laterality, Correct Procedure, Correct Position, site marked, Risks and benefits discussed,  Surgical consent,  Pre-op evaluation,  At surgeon's request and post-op pain management  Laterality: Left  Prep: chloraprep       Needles:  Injection technique: Single-shot  Needle Type: Echogenic Stimulator Needle     Needle Length: 5cm  Needle Gauge: 22     Additional Needles:   Procedures:, nerve stimulator,,, ultrasound used (permanent image in chart),,,,   Nerve Stimulator or Paresthesia:  Response: hand, 0.45 mA,   Additional Responses:   Narrative:  Start time: 06/26/2018 8:12 AM End time: 06/26/2018 8:16 AM Injection made incrementally with aspirations every 5 mL.  Performed by: Personally  Anesthesiologist: Bethena Midget, MD  Additional Notes: Functioning IV was confirmed and monitors were applied.  A 45mm 22ga Arrow echogenic stimulator needle was used. Sterile prep and drape,hand hygiene and sterile gloves were used. Ultrasound guidance: relevant anatomy identified, needle position confirmed, local anesthetic spread visualized around nerve(s)., vascular puncture avoided.  Image printed for medical record. Negative aspiration and negative test dose prior to incremental administration of local anesthetic. The patient tolerated the procedure well.

## 2018-06-26 NOTE — Op Note (Signed)
PREOPERATIVE DIAGNOSIS: Left index finger circular saw injury  POSTOPERATIVE DIAGNOSIS: Same  ATTENDING PHYSICIAN: Maudry Mayhew. Jeannie Fend, III, MD who was present and scrubbed for the entire case   ASSISTANT SURGEON: None.   ANESTHESIA: MAC with regional  SURGICAL PROCEDURES:  1.  Irrigation debridement of skin, subcutaneous tissue and bone for open fracture of the middle phalanx to the index finger 2.  Close reduction percutaneous pin fixation of left index finger middle phalanx fracture 3.  Flexor digitorum profundus repair in zone 2 4.  Nerve tube assisted repair of the radial digital nerve  SURGICAL INDICATIONS: Patient is a 63 year old male who over the weekend hit his left index finger with a circular saw.  He sustained a volar laceration as well as a displaced fracture to the middle phalanx.  He was evaluated in clinic and was found to have a flexor digitorum profundus laceration as well as a displaced middle phalanx fracture.  Additionally he had decreased sensation to the tip of the finger primarily in the radial nerve distribution.  I discussed with him treatment options and he did recommend proceeding forward with operative fixation of the finger.  FINDING: Displaced fracture of the index finger middle phalanx with near anatomic reduction following open reduction and internal fixation with 0.035 K wires x2.  Complete transection of the flexor digitorum profundus at the level of the fracture.  The fracture was at the insertion of the FDS with portions of the tendon still attached to the proximal fragment.  The FDP tendon was fixed within zone 2 with a 6 strand repair.  There was complete transection of both radial and ulnar digital nerve and artery.  There is a 5 mm defect to the radial digital nerve and substantial damage which would have resulted in a 2 cm defect to the ulnar digital nerve.  Nerve tube assisted repair was performed to the radial digital nerve.    DESCRIPTION OF PROCEDURE:  Patient was identified in preoperative holding area where the risk benefits and alternatives of the procedure were discussed with patient.  These include but are not limited to infection, bleeding, damage to surrounding structures include blood vessels nerves, pains, stiffness, malunion, nonunion and need for additional procedures.  Informed consent was obtained at that time the patient's left index finger was marked with surgical marking pen.  Left upper extremity nerve block was then performed and he was brought back to operative suite where a timeout was performed identifying the correct patient operative site.  He is positioned supine operative table induced under MAC sedation.  A tourniquet was placed on the upper arm the arm was then prepped and draped in usual sterile fashion.  Limb was exsanguinated and tourniquet was inflated.  The middle phalanx fracture was reduced.  Two 0.35 K wires were inserted into the proximal phalanx at the articular margin and advanced across the fracture site into the subchondral bone distally.  Fluoroscopic images were obtained which showed near anatomic reduction of the fracture and appropriate pin positioning.  The K wires were cut and caps were placed.  The hand was then flipped and the volar laceration was identified.  This was extended both proximally and distally in a Vining fashion.  The radial and ulnar digital nerves and arteries were identified proximally and traced distally to the laceration site.  Both radial and ulnar neurovascular bundles were transected.  There was substantial damage to the ulnar digital nerve with streaking throughout the nerve back approximately 2 cm.  There was  less significant damage to the radial digital nerve.  Additionally the flexor digitorum profundus tendon was lacerated at the area of the fracture site.  Deep to that the FDS tendon was visualized and found to have fibers still attached to the proximal fragment.  There were some  fragments of bone within the tendon sheath which were removed with a rondure.  The wound was then copiously irrigated with normal saline.  It was debrided gently using a rondure to remove loose bony fragments.  This debridement included skin, subcutaneous tissue and bone.  The A2 pulley was vented approximately 5 mm and the FDP tendon proximally was retrieved and utilizing a Prolene suture and the end was advanced underneath the intact pulley to the laceration site.  Utilizing a 3-0 looped Supramid suture x2 a M. Tang flexor tendon repair technique was performed with good approximation of the tendon edges.  This resulted in a 6 strand core suture repair.  A running 6-0 nylon epitendinous stitch was also performed smoothing out the contours of the jagged tendon edges.  We then turned our attention to the digital nerves.  Both proximal and distal ends of the radial digital nerve were identified.  Under 3.5 times loupe magnification the nerve ends were debrided back until healthy nerve fibers were visualized.  This left with approximately 5 mm nerve defects.  Decision was then made to use a nerve tube to allow for the nerve to regenerate distally.  A 1.5 mm nerve tube was then placed and stitched to the proximal nerve and using 8-0 nylon suture x2.  Nerve tube was then cut to the its appropriate length and the distal nerve was stitched to the nerve tube as well using an 8-0 nylon suture.  This was a Contractor.  The wound was then copiously irrigated once again.  The skin edges were then reapproximated and closed with interrupted 4-0 Prolene sutures.  Xeroform, 4 x 4's, Kerlix and a well-padded splint incorporating all digits was placed.  Prior to wound closure the tourniquet was released and the patient had return of brisk cap refill to the digit.  The patient was awoken from his sedation and taken to the PACU in stable condition.  He tolerated the procedure well and there were no  complications.  RADIOGRAPHIC INTERPRETATION: 2 views of the left index finger including PA and lateral images were obtained Intra-Op.  These show 2 K wires traversing the fracture site with the fracture and near anatomic alignment.  Joints are well aligned and there are no dislocation.  ESTIMATED BLOOD LOSS: 15 mL's  TOURNIQUET TIME: Less than 90 minutes  SPECIMENS: None  POSTOPERATIVE PLAN: The patient will be discharged home and seen back  in the office in approximately 5-7 days for wound check, and then be sent to a therapist for wound care, splint fabrication and attempted protected range of motion.  IMPLANTS: 0.035 K wires x2

## 2018-06-26 NOTE — Anesthesia Postprocedure Evaluation (Signed)
Anesthesia Post Note  Patient: Cory Johnson  Procedure(s) Performed: Left Index finger wound exploration, irrigation and debridement, fracture fixation, flexor tendon repair, digital nerve repair with conduit as needed and surgery as indicated (Left )     Patient location during evaluation: PACU Anesthesia Type: Regional and MAC Level of consciousness: awake and alert Pain management: pain level controlled Vital Signs Assessment: post-procedure vital signs reviewed and stable Respiratory status: spontaneous breathing, nonlabored ventilation, respiratory function stable and patient connected to nasal cannula oxygen Cardiovascular status: stable and blood pressure returned to baseline Postop Assessment: no apparent nausea or vomiting Anesthetic complications: no    Last Vitals:  Vitals:   06/26/18 1130 06/26/18 1200  BP:    Pulse:  (!) 52  Resp: 18 20  Temp:  36.6 C  SpO2: 100% 97%    Last Pain:  Vitals:   06/26/18 1200  TempSrc:   PainSc: 0-No pain                 Eleanora Guinyard

## 2018-06-26 NOTE — H&P (Signed)
Cory Johnson is an 63 y.o. male.  HPI: Patient is a 63 year old male who presents today for treatment of his left index finger.  He sustained a circular saw injury over the weekend involving a middle phalanx fracture as well as digital nerve and flexor tendon injury.  Yesterday in clinic we discussed surgical interventions and he presents today for this.  Past Medical History:  Diagnosis Date  . Acute pulmonary embolus (HCC) 11/07/2009   pt denies  . Arthritis    neck - limited range of motion; back, shoulders  . Chronic back pain   . Difficult intubation   . DVT of lower extremity (deep venous thrombosis) (HCC) 11/07/2009  . Occasional tremors 03/2017   HANDS   . Right inguinal hernia 02/2011  . Seizures (HCC)    pt denies  . Syncope and collapse 03/26/2017  . Umbilical hernia 02/2011    Past Surgical History:  Procedure Laterality Date  . HERNIA REPAIR    . INGUINAL HERNIA REPAIR  03/17/2011   Procedure: HERNIA REPAIR INGUINAL ADULT;  Surgeon: Ernestene Mention, MD;  Location: [facility];  Service: General;  Laterality: Right;  right inguinal hernia repair with mesh  . KNEE SURGERY  1977   left  . UMBILICAL HERNIA REPAIR  03/17/2011   Procedure: HERNIA REPAIR UMBILICAL ADULT;  Surgeon: Ernestene Mention, MD;  Location: [facility];  Service: General;  Laterality: N/A;  umbilical hernia repair with mesh    Family History  Problem Relation Age of Onset  . Hypertension Other     Social History:  reports that he has never smoked. He has never used smokeless tobacco. He reports that he does not drink alcohol or use drugs.  Allergies: No Known Allergies  Medications: I have reviewed the patient's current medications.  No results found for this or any previous visit (from the past 48 hour(s)).  No results found.  Review of Systems  Musculoskeletal: Positive for joint pain.  Neurological: Positive for tremors.  All other systems reviewed and  are negative.  Blood pressure (!) 124/106, pulse (!) 59, temperature 98.1 F (36.7 C), temperature source Oral, resp. rate (!) 46, height 6\' 2"  (1.88 m), weight 83.5 kg, SpO2 100 %.   aaox3 nad resp nonlabored rrr LUE: index finger with dressing in place. Pain with attempted ROM. Decreased sensation to the tip. Finger tip wwp with bcr.  Assessment/Plan: 63 year old male with left index finger saw laceration with middle phalanx fracture, flexor tendon laceration and digital nerve laceration  Plan to proceed the OR today for irrigation debridement of his open wound, fracture fixation, flexor tendon repair and digital nerve repair.  The risk and benefits were discussed with the patient yesterday in clinic which include but are not limited to infection, bleeding, damage to surrounding structures including blood vessels and nerves, pain, stiffness, malunion, nonunion and need for additional procedures.  Informed consent was obtained today and the left index finger was marked.  Patient will be discharged home afterwards and will follow up with me in clinic next week.  Cory Johnson 06/26/2018, 8:03 AM

## 2018-06-28 ENCOUNTER — Encounter (HOSPITAL_BASED_OUTPATIENT_CLINIC_OR_DEPARTMENT_OTHER): Payer: Self-pay | Admitting: Orthopaedic Surgery

## 2018-07-05 DIAGNOSIS — S66121D Laceration of flexor muscle, fascia and tendon of left index finger at wrist and hand level, subsequent encounter: Secondary | ICD-10-CM | POA: Diagnosis not present

## 2018-07-05 DIAGNOSIS — M79642 Pain in left hand: Secondary | ICD-10-CM | POA: Diagnosis not present

## 2018-07-09 DIAGNOSIS — M79642 Pain in left hand: Secondary | ICD-10-CM | POA: Diagnosis not present

## 2018-07-22 ENCOUNTER — Encounter (HOSPITAL_BASED_OUTPATIENT_CLINIC_OR_DEPARTMENT_OTHER): Payer: Self-pay | Admitting: Orthopaedic Surgery

## 2018-09-09 DIAGNOSIS — R3915 Urgency of urination: Secondary | ICD-10-CM | POA: Diagnosis not present

## 2018-09-09 DIAGNOSIS — R8279 Other abnormal findings on microbiological examination of urine: Secondary | ICD-10-CM | POA: Diagnosis not present

## 2018-09-10 ENCOUNTER — Ambulatory Visit: Payer: Self-pay | Admitting: *Deleted

## 2018-09-10 ENCOUNTER — Other Ambulatory Visit: Payer: Self-pay

## 2018-09-10 ENCOUNTER — Inpatient Hospital Stay (HOSPITAL_COMMUNITY)
Admission: EM | Admit: 2018-09-10 | Discharge: 2018-09-12 | DRG: 871 | Disposition: A | Discharge status: Home Health | Payer: Medicare Other | Attending: Internal Medicine | Admitting: Internal Medicine

## 2018-09-10 ENCOUNTER — Emergency Department (HOSPITAL_COMMUNITY): Payer: Medicare Other

## 2018-09-10 DIAGNOSIS — R269 Unspecified abnormalities of gait and mobility: Secondary | ICD-10-CM | POA: Diagnosis not present

## 2018-09-10 DIAGNOSIS — A419 Sepsis, unspecified organism: Secondary | ICD-10-CM | POA: Diagnosis not present

## 2018-09-10 DIAGNOSIS — Z1159 Encounter for screening for other viral diseases: Secondary | ICD-10-CM | POA: Diagnosis not present

## 2018-09-10 DIAGNOSIS — Z86711 Personal history of pulmonary embolism: Secondary | ICD-10-CM | POA: Diagnosis not present

## 2018-09-10 DIAGNOSIS — M1990 Unspecified osteoarthritis, unspecified site: Secondary | ICD-10-CM | POA: Diagnosis present

## 2018-09-10 DIAGNOSIS — R1084 Generalized abdominal pain: Secondary | ICD-10-CM | POA: Diagnosis not present

## 2018-09-10 DIAGNOSIS — D696 Thrombocytopenia, unspecified: Secondary | ICD-10-CM

## 2018-09-10 DIAGNOSIS — G9340 Encephalopathy, unspecified: Secondary | ICD-10-CM | POA: Diagnosis not present

## 2018-09-10 DIAGNOSIS — R319 Hematuria, unspecified: Secondary | ICD-10-CM

## 2018-09-10 DIAGNOSIS — B9689 Other specified bacterial agents as the cause of diseases classified elsewhere: Secondary | ICD-10-CM | POA: Diagnosis present

## 2018-09-10 DIAGNOSIS — R4020 Unspecified coma: Secondary | ICD-10-CM | POA: Diagnosis not present

## 2018-09-10 DIAGNOSIS — R17 Unspecified jaundice: Secondary | ICD-10-CM

## 2018-09-10 DIAGNOSIS — Z86718 Personal history of other venous thrombosis and embolism: Secondary | ICD-10-CM | POA: Diagnosis not present

## 2018-09-10 DIAGNOSIS — M549 Dorsalgia, unspecified: Secondary | ICD-10-CM | POA: Diagnosis not present

## 2018-09-10 DIAGNOSIS — R6520 Severe sepsis without septic shock: Secondary | ICD-10-CM | POA: Diagnosis not present

## 2018-09-10 DIAGNOSIS — G9341 Metabolic encephalopathy: Secondary | ICD-10-CM

## 2018-09-10 DIAGNOSIS — R410 Disorientation, unspecified: Secondary | ICD-10-CM | POA: Diagnosis not present

## 2018-09-10 DIAGNOSIS — R1030 Lower abdominal pain, unspecified: Secondary | ICD-10-CM | POA: Diagnosis not present

## 2018-09-10 DIAGNOSIS — N3001 Acute cystitis with hematuria: Secondary | ICD-10-CM | POA: Diagnosis not present

## 2018-09-10 DIAGNOSIS — I499 Cardiac arrhythmia, unspecified: Secondary | ICD-10-CM | POA: Diagnosis not present

## 2018-09-10 DIAGNOSIS — N390 Urinary tract infection, site not specified: Secondary | ICD-10-CM | POA: Diagnosis not present

## 2018-09-10 DIAGNOSIS — R4182 Altered mental status, unspecified: Secondary | ICD-10-CM | POA: Diagnosis not present

## 2018-09-10 DIAGNOSIS — G8929 Other chronic pain: Secondary | ICD-10-CM | POA: Diagnosis present

## 2018-09-10 DIAGNOSIS — I491 Atrial premature depolarization: Secondary | ICD-10-CM | POA: Diagnosis not present

## 2018-09-10 LAB — URINALYSIS, ROUTINE W REFLEX MICROSCOPIC
Glucose, UA: 250 mg/dL — AB
Ketones, ur: 15 mg/dL — AB
Nitrite: POSITIVE — AB
Protein, ur: >300 mg/dL — AB
Specific Gravity, Urine: 1.025 (ref 1.005–1.030)
pH: 6.5 (ref 5.0–8.0)

## 2018-09-10 LAB — CBC WITH DIFFERENTIAL/PLATELET
Basophils Absolute: 0 10*3/uL (ref 0.0–0.1)
Basophils Relative: 0 %
Eosinophils Absolute: 0 10*3/uL (ref 0.0–0.5)
Eosinophils Relative: 0 %
HCT: 49.6 % (ref 39.0–52.0)
Hemoglobin: 15.3 g/dL (ref 13.0–17.0)
Lymphocytes Relative: 8 %
Lymphs Abs: 1 10*3/uL (ref 0.7–4.0)
MCH: 29.5 pg (ref 26.0–34.0)
MCHC: 30.8 g/dL (ref 30.0–36.0)
MCV: 95.8 fL (ref 80.0–100.0)
Monocytes Absolute: 1.3 10*3/uL — ABNORMAL HIGH (ref 0.1–1.0)
Monocytes Relative: 11 %
Neutro Abs: 10.1 10*3/uL — ABNORMAL HIGH (ref 1.7–7.7)
Neutrophils Relative %: 81 %
Platelets: 73 10*3/uL — ABNORMAL LOW (ref 150–400)
RBC: 5.18 MIL/uL (ref 4.22–5.81)
RDW: 12.9 % (ref 11.5–15.5)
WBC: 12.5 10*3/uL — ABNORMAL HIGH (ref 4.0–10.5)

## 2018-09-10 LAB — COMPREHENSIVE METABOLIC PANEL
ALT: 6 U/L (ref 0–44)
AST: 19 U/L (ref 15–41)
Albumin: 3.4 g/dL — ABNORMAL LOW (ref 3.5–5.0)
Alkaline Phosphatase: 70 U/L (ref 38–126)
Anion gap: 11 (ref 5–15)
BUN: 13 mg/dL (ref 8–23)
CO2: 22 mmol/L (ref 22–32)
Calcium: 9 mg/dL (ref 8.9–10.3)
Chloride: 104 mmol/L (ref 98–111)
Creatinine, Ser: 0.98 mg/dL (ref 0.61–1.24)
GFR calc Af Amer: >60 mL/min (ref >60)
GFR calc non Af Amer: >60 mL/min (ref >60)
Glucose, Bld: 94 mg/dL (ref 70–99)
Potassium: 5.1 mmol/L (ref 3.5–5.1)
Sodium: 137 mmol/L (ref 135–145)
Total Bilirubin: 1.5 mg/dL — ABNORMAL HIGH (ref 0.3–1.2)
Total Protein: 6.8 g/dL (ref 6.5–8.1)

## 2018-09-10 LAB — URINALYSIS, MICROSCOPIC (REFLEX): Squamous Epithelial / HPF: NONE SEEN (ref 0–5)

## 2018-09-10 LAB — SARS CORONAVIRUS 2 BY RT PCR (HOSPITAL ORDER, PERFORMED IN ~~LOC~~ HOSPITAL LAB): SARS Coronavirus 2: NEGATIVE

## 2018-09-10 LAB — LACTIC ACID, PLASMA
Lactic Acid, Venous: 2.8 mmol/L (ref 0.5–1.9)
Lactic Acid, Venous: 1.1 mmol/L (ref 0.5–1.9)

## 2018-09-10 MED ORDER — SODIUM CHLORIDE 0.9 % IV SOLN
1.0000 g | INTRAVENOUS | Status: DC
  Administered 2018-09-10 – 2018-09-11 (×2): 1 g via INTRAVENOUS
  Filled 2018-09-10: qty 1
  Filled 2018-09-10 (×2): qty 10

## 2018-09-10 MED ORDER — ACETAMINOPHEN 650 MG RE SUPP: 650.0000 mg | Freq: Four times a day (QID) | RECTAL | Status: DC | PRN

## 2018-09-10 MED ORDER — ACETAMINOPHEN 325 MG PO TABS
650.0000 mg | ORAL_TABLET | Freq: Four times a day (QID) | ORAL | Status: DC | PRN
  Administered 2018-09-11 – 2018-09-12 (×4): 650 mg via ORAL
  Filled 2018-09-10 (×4): qty 2

## 2018-09-10 MED ORDER — SODIUM CHLORIDE 0.9 % IV BOLUS
1000.0000 mL | Freq: Once | INTRAVENOUS | Status: AC
  Administered 2018-09-10: 1000 mL via INTRAVENOUS

## 2018-09-10 MED ORDER — SODIUM CHLORIDE 0.9 % IV SOLN
INTRAVENOUS | Status: AC
  Administered 2018-09-11: 01:00:00 via INTRAVENOUS

## 2018-09-10 MED ORDER — SODIUM CHLORIDE 0.9 % IV BOLUS
500.0000 mL | Freq: Once | INTRAVENOUS | Status: AC
  Administered 2018-09-10: 500 mL via INTRAVENOUS

## 2018-09-10 NOTE — ED Triage Notes (Signed)
Pt from home. Pt recently diagnosed with UTI. Family states he has become more confused and altered. Pt is A&O x4. Pt was ambulatory to stretcher. Pt has tremors due to parkinson.

## 2018-09-10 NOTE — ED Notes (Signed)
ED TO INPATIENT HANDOFF REPORT  ED Nurse Name and Phone #: [phone]  S Name/Age/Gender Cory DressKenneth B Schumacher Sr. 63 y.o. male Room/Bed: 041C/041C  Code Status   Code Status: Full Code  Home/SNF/Other Home Patient oriented to: self, place and time Is this baseline? No   Triage Complete: Triage complete  Chief Complaint UTI w/ irregular heart rhythm  Triage Note Pt from home. Pt recently diagnosed with UTI. Family states he has become more confused and altered. Pt is A&O x4. Pt was ambulatory to stretcher. Pt has tremors due to parkinson.    Allergies No Known Allergies  Level of Care/Admitting Diagnosis ED Disposition    ED Disposition Condition Comment   Admit  Hospital Area: [facility] [[id number]]  Level of Care: Telemetry Medical [104]  Covid Evaluation: Confirmed COVID Negative  Diagnosis: UTI (urinary tract infection) [[id number]][[id number]]  Admitting Physician: John GiovanniATHORE, VASUNDHRA [[id number]]  Attending Physician: John GiovanniATHORE, VASUNDHRA [[id number]]  Estimated length of stay: past midnight tomorrow  Certification:: I certify this patient will need inpatient services for at least 2 midnights  PT Class (Do Not Modify): Inpatient [101]  PT Acc Code (Do Not Modify): Private [1]       B Medical/Surgery History Past Medical History:  Diagnosis Date  . Acute pulmonary embolus (HCC) 11/07/2009   pt denies  . Arthritis    neck - limited range of motion; back, shoulders  . Chronic back pain   . Difficult intubation   . DVT of lower extremity (deep venous thrombosis) (HCC) 11/07/2009  . Occasional tremors 03/2017   HANDS   . Right inguinal hernia 02/2011  . Seizures (HCC)    pt denies  . Syncope and collapse 03/26/2017  . Umbilical hernia 02/2011   Past Surgical History:  Procedure Laterality Date  . CLOSED REDUCTION FINGER WITH PERCUTANEOUS PINNING Left 06/26/2018   Procedure: CLOSED REDUCTION FINGER WITH PERCUTANEOUS PINNING;  Surgeon: Ernest Mallickreighton, James J III, MD;   Location: [facility];  Service: Orthopedics;  Laterality: Left;  . HERNIA REPAIR    . I&D EXTREMITY Left 06/26/2018   Procedure: Left Index finger wound exploration, irrigation and debridement, fracture fixation, flexor tendon repair, digital nerve repair with conduit as needed and surgery as indicated;  Surgeon: Ernest Mallickreighton, James J III, MD;  Location: [facility];  Service: Orthopedics;  Laterality: Left;  90min  . INGUINAL HERNIA REPAIR  03/17/2011   Procedure: HERNIA REPAIR INGUINAL ADULT;  Surgeon: Ernestene MentionHaywood M Ingram, MD;  Location: [facility];  Service: General;  Laterality: Right;  right inguinal hernia repair with mesh  . KNEE SURGERY  1977   left  . NERVE AND TENDON REPAIR Left 06/26/2018   Procedure: NERVE AND TENDON REPAIR;  Surgeon: Ernest Mallickreighton, James J III, MD;  Location: [facility];  Service: Orthopedics;  Laterality: Left;  . UMBILICAL HERNIA REPAIR  03/17/2011   Procedure: HERNIA REPAIR UMBILICAL ADULT;  Surgeon: Ernestene MentionHaywood M Ingram, MD;  Location: [facility];  Service: General;  Laterality: N/A;  umbilical hernia repair with mesh     A IV Location/Drains/Wounds Patient Lines/Drains/Airways Status   Active Line/Drains/Airways    Name:   Placement date:   Placement time:   Site:   Days:   Peripheral IV 06/26/18 Right Arm   06/26/18    0741    Arm   76   Peripheral IV 09/10/18 Right Hand   09/10/18    2050    Hand   less  than 1   Incision 03/17/11 Umbilicus Other (Comment)   03/17/11    1040     2734   Incision 03/17/11 Abdomen Right   03/17/11    1040     2734   Incision (Closed) 06/26/18 Finger (Comment which one) Left   06/26/18    0940     76          Intake/Output Last 24 hours  Intake/Output Summary (Last 24 hours) at 09/10/2018 2329 Last data filed at 09/10/2018 2217 Gross per 24 hour  Intake 98.69 ml  Output -  Net 98.69 ml    Labs/Imaging Results for orders placed or performed during the hospital  encounter of 09/10/18 (from the past 48 hour(s))  Urinalysis, Routine w reflex microscopic     Status: Abnormal   Collection Time: 09/10/18  4:41 PM  Result Value Ref Range   Color, Urine AMBER (A) YELLOW    Comment: BIOCHEMICALS MAY BE AFFECTED BY COLOR   APPearance CLOUDY (A) CLEAR   Specific Gravity, Urine 1.025 1.005 - 1.030   pH 6.5 5.0 - 8.0   Glucose, UA 250 (A) NEGATIVE mg/dL   Hgb urine dipstick LARGE (A) NEGATIVE   Bilirubin Urine MODERATE (A) NEGATIVE   Ketones, ur 15 (A) NEGATIVE mg/dL   Protein, ur >440>300 (A) NEGATIVE mg/dL   Nitrite POSITIVE (A) NEGATIVE   Leukocytes,Ua MODERATE (A) NEGATIVE    Comment: Performed at [facility], [address] [place], [place] [address]  Urinalysis, Microscopic (reflex)     Status: Abnormal   Collection Time: 09/10/18  4:41 PM  Result Value Ref Range   RBC / HPF 21-50 0 - 5 RBC/hpf   WBC, UA 11-20 0 - 5 WBC/hpf    Comment: MICROSCOPIC PERFORMED ON UNSPUN URINE   Bacteria, UA RARE (A) NONE SEEN   Squamous Epithelial / LPF NONE SEEN 0 - 5    Comment: Performed at [facility], [address] [place], [place] [address]  SARS Coronavirus 2 (CEPHEID- Performed in [facility] hospital lab), Hosp Order     Status: None   Collection Time: 09/10/18  5:44 PM   Specimen: Nasopharyngeal Swab  Result Value Ref Range   SARS Coronavirus 2 NEGATIVE NEGATIVE    Comment: (NOTE) If result is NEGATIVE SARS-CoV-2 target nucleic acids are NOT DETECTED. The SARS-CoV-2 RNA is generally detectable in upper and lower  respiratory specimens during the acute phase of infection. The lowest  concentration of SARS-CoV-2 viral copies this assay can detect is 250  copies / mL. A negative result does not preclude SARS-CoV-2 infection  and should not be used as the sole basis for treatment or other  patient management decisions.  A negative result may occur with  improper specimen collection / handling, submission of specimen other  than  nasopharyngeal swab, presence of viral mutation(s) within the  areas targeted by this assay, and inadequate number of viral copies  (<250 copies / mL). A negative result must be combined with clinical  observations, patient history, and epidemiological information. If result is POSITIVE SARS-CoV-2 target nucleic acids are DETECTED. The SARS-CoV-2 RNA is generally detectable in upper and lower  respiratory specimens dur ing the acute phase of infection.  Positive  results are indicative of active infection with SARS-CoV-2.  Clinical  correlation with patient history and other diagnostic information is  necessary to determine patient infection status.  Positive results do  not rule out bacterial infection or co-infection  with other viruses. If result is PRESUMPTIVE POSTIVE SARS-CoV-2 nucleic acids MAY BE PRESENT.   A presumptive positive result was obtained on the submitted specimen  and confirmed on repeat testing.  While 2019 novel coronavirus  (SARS-CoV-2) nucleic acids may be present in the submitted sample  additional confirmatory testing may be necessary for epidemiological  and / or clinical management purposes  to differentiate between  SARS-CoV-2 and other Sarbecovirus currently known to infect humans.  If clinically indicated additional testing with an alternate test  methodology [phone]([id number]) is advised. The SARS-CoV-2 RNA is generally  detectable in upper and lower respiratory sp ecimens during the acute  phase of infection. The expected result is Negative. Fact Sheet for Patients:  BoilerBrush.com.cyhttps://www.fda.gov/media/136312/download Fact Sheet for Healthcare Providers: https://pope.com/https://www.fda.gov/media/136313/download This test is not yet approved or cleared by the [place] FDA and has been authorized for detection and/or diagnosis of SARS-CoV-2 by FDA under an Emergency Use Authorization (EUA).  This EUA will remain in effect (meaning this test can be used) for the duration of  the COVID-19 declaration under Section 564(b)(1) of the Act, 21 U.S.C. section 360bbb-3(b)(1), unless the authorization is terminated or revoked sooner. Performed at [facility], [address] [place], [place] [address]   CBC with Differential     Status: Abnormal   Collection Time: 09/10/18  7:22 PM  Result Value Ref Range   WBC 12.5 (H) 4.0 - 10.5 K/uL   RBC 5.18 4.22 - 5.81 MIL/uL   Hemoglobin 15.3 13.0 - 17.0 g/dL   HCT 81.149.6 91.439.0 - 78.252.0 %   MCV 95.8 80.0 - 100.0 fL   MCH 29.5 26.0 - 34.0 pg   MCHC 30.8 30.0 - 36.0 g/dL   RDW 95.612.9 21.311.5 - 08.615.5 %   Platelets 73 (L) 150 - 400 K/uL    Comment: REPEATED TO VERIFY SPECIMEN CHECKED FOR CLOTS PLATELET COUNT CONFIRMED BY SMEAR    Neutrophils Relative % 81 %   Neutro Abs 10.1 (H) 1.7 - 7.7 K/uL   Lymphocytes Relative 8 %   Lymphs Abs 1.0 0.7 - 4.0 K/uL   Monocytes Relative 11 %   Monocytes Absolute 1.3 (H) 0.1 - 1.0 K/uL   Eosinophils Relative 0 %   Eosinophils Absolute 0.0 0.0 - 0.5 K/uL   Basophils Relative 0 %   Basophils Absolute 0.0 0.0 - 0.1 K/uL    Comment: Performed at [facility], [address] [place], [place] [address]  Comprehensive metabolic panel     Status: Abnormal   Collection Time: 09/10/18  7:22 PM  Result Value Ref Range   Sodium 137 135 - 145 mmol/L   Potassium 5.1 3.5 - 5.1 mmol/L   Chloride 104 98 - 111 mmol/L   CO2 22 22 - 32 mmol/L   Glucose, Bld 94 70 - 99 mg/dL   BUN 13 8 - 23 mg/dL   Creatinine, Ser 9.620.98 0.61 - 1.24 mg/dL   Calcium 9.0 8.9 - 95.210.3 mg/dL   Total Protein 6.8 6.5 - 8.1 g/dL   Albumin 3.4 (L) 3.5 - 5.0 g/dL   AST 19 15 - 41 U/L   ALT 6 0 - 44 U/L   Alkaline Phosphatase 70 38 - 126 U/L   Total Bilirubin 1.5 (H) 0.3 - 1.2 mg/dL   GFR calc non Af Amer >60 >60 mL/min   GFR calc Af Amer >60 >60 mL/min   Anion gap 11 5 - 15    Comment: Performed at [facility], [address], [place], [place] [address]  Lactic acid, plasma     Status: Abnormal   Collection  Time: 09/10/18  7:22 PM  Result Value Ref Range   Lactic Acid, Venous 2.8 (HH) 0.5 - 1.9 mmol/L    Comment: CRITICAL RESULT CALLED TO, READ BACK BY AND VERIFIED WITH: D.Hearl Heikes RN 2015 09/10/2018 MCCORMICK K Performed at [facility], [address], [place], [place] [address]   Lactic acid, plasma     Status: None   Collection Time: 09/10/18  9:38 PM  Result Value Ref Range   Lactic Acid, Venous 1.1 0.5 - 1.9 mmol/L    Comment: Performed at [facility], [address] [place], [place] [address]   Ct Head Wo Contrast  Result Date: 09/10/2018 CLINICAL DATA:  Altered LOC EXAM: CT HEAD WITHOUT CONTRAST TECHNIQUE: Contiguous axial images were obtained from the base of the skull through the vertex without intravenous contrast. COMPARISON:  CT brain 01/23/2016 FINDINGS: Brain: Significant motion degradation. No gross mass or hemorrhage. Nonenlarged ventricles. Vascular: No unexpected calcification Skull: No fracture Sinuses/Orbits: Bony expansion of the anterior wall of right maxillary sinus with internal low-density. No acute fluid levels in the sinuses. Other: None IMPRESSION: 1. Motion degraded study. This limits evaluation of the brain. No gross acute intracranial abnormality. 2. Slightly expanded appearance of the anterior wall of right maxillary sinus with associated lucent areas, possibly an area of fibrous dysplasia or pagetoid change. Electronically Signed   By: Jasmine Pang M.D.   On: 09/10/2018 20:56   Dg Chest Portable 1 View  Result Date: 09/10/2018 CLINICAL DATA:  63 year old male recently diagnosed with UTI. Confusion. EXAM: PORTABLE CHEST 1 VIEW COMPARISON:  09/24/2017 and earlier. FINDINGS: Portable AP semi upright view at 1813 hours. Larger lung volumes. Stable mildly tortuous thoracic aorta. Other mediastinal contours are within normal limits. Visualized tracheal air column is within normal limits. Allowing for portable technique the lungs are clear. No pneumothorax or  pleural effusion. No acute osseous abnormality identified. IMPRESSION: No acute cardiopulmonary abnormality. Electronically Signed   By: Odessa Fleming M.D.   On: 09/10/2018 18:37    Pending Labs Unresulted Labs (From admission, onward)    Start     Ordered   09/11/18 0500  CBC  Tomorrow morning,   R     09/10/18 2137   09/11/18 0500  Hepatic function panel  Tomorrow morning,   R     09/10/18 2137   09/10/18 2208  TSH  Once,   STAT     09/10/18 2207   09/10/18 2208  Vitamin B12  Once,   STAT     09/10/18 2207   09/10/18 2208  Ammonia  Once,   STAT     09/10/18 2207   09/10/18 2138  Troponin I (High Sensitivity)  ONCE - STAT,   STAT    Question Answer Comment  Indication Other   Specify indication Abnormal EKG      09/10/18 2137   09/10/18 2136  HIV antibody (Routine Testing)  Once,   STAT     09/10/18 2137   09/10/18 2019  Blood Culture (routine x 2)  BLOOD CULTURE X 2,   STAT     09/10/18 2019   09/10/18 1706  Urine culture  ONCE - STAT,   STAT     09/10/18 1705          Vitals/Pain Today's Vitals   09/10/18 2200 09/10/18 2215 09/10/18  2230 09/10/18 2256  BP: 123/78 107/67 110/68 111/73  Pulse:    85  Resp: 20 (!) 24 (!) 22 20  Temp:      TempSrc:      SpO2:    100%  PainSc:        Isolation Precautions No active isolations  Medications Medications  cefTRIAXone (ROCEPHIN) 1 g in sodium chloride 0.9 % 100 mL IVPB (0 g Intravenous Stopped 09/10/18 2217)  acetaminophen (TYLENOL) tablet 650 mg (has no administration in time range)    Or  acetaminophen (TYLENOL) suppository 650 mg (has no administration in time range)  0.9 %  sodium chloride infusion (has no administration in time range)  sodium chloride 0.9 % bolus 500 mL (500 mLs Intravenous New Bag/Given 09/10/18 2309)  sodium chloride 0.9 % bolus 1,000 mL (1,000 mLs Intravenous New Bag/Given 09/10/18 2145)    Mobility walks High fall risk   Focused Assessments Cardiac Assessment Handoff:  Cardiac Rhythm: Normal  sinus rhythm Lab Results  Component Value Date   CKTOTAL 32 (L) 03/27/2017   CKMB 1.2 11/08/2009   TROPONINI <0.03 03/27/2017   Lab Results  Component Value Date   DDIMER 0.64 (H) 03/27/2017   Does the Patient currently have chest pain? No     R Recommendations: See Admitting Provider Note  Report given to:   Additional Notes:

## 2018-09-10 NOTE — Telephone Encounter (Signed)
Wife called with the patient having weakness and just wanting to sleep. He has been restless today and wanting to go back to bed. Started yesterday. Is disoriented. Drooling and weak. Not talking. The wife and the son had to help him walk down the hall and outside.  Advised to call 911. Pt could be possible has had a stroke. She will call 911. Routing to PCP for review.  Reason for Disposition . [1] SEVERE weakness (i.e., unable to walk or barely able to walk, requires support) AND [2] new onset or worsening  Protocols used: NEUROLOGIC DEFICIT-A-AH

## 2018-09-10 NOTE — ED Notes (Signed)
Spoke with the admitting doctor and she said that the only admission lab that she is concerned about is the troponin but that can be held a couple of hours since the patient is a hard stick.

## 2018-09-10 NOTE — ED Notes (Addendum)
Phlebotomy attempted blood draw and was unsuccessful. This RN attempted to start an IV and was unsuccessful . IV team attempted to start an IV and was unsuccessful. EDP notified.

## 2018-09-10 NOTE — H&P (Signed)
History and Physical    Cory DressKenneth B Townsel Sr. WUJ:[id number] DOB: 12/17/1955 DOA: 09/10/2018  PCP: Shade FloodGreene, Jeffrey R, MD Patient coming from: Home  Chief Complaint: Altered mental status  HPI: Cory DressKenneth B Mata Sr. is a 63 y.o. male with medical history significant of VTE, seizures, chronic back pain, arthritis, essential tremor presenting to the hospital for altered mental status.  Patient is AAO x3 but not sure why he is here.  He does not have any complaints.  Denies any fevers, chills, chest pain, shortness of breath, cough, nausea, vomiting, abdominal pain, or diarrhea.  He is not able to tell me if he is having dysuria or urinary frequency/urgency. Per chart review, patient's wife had called his PCPs office today and reported that patient is having weakness and is just wanting to sleep.  He has been restless today and wanting to go back to bed.  He was disoriented yesterday, drooling, and weak.  Not talking.  Wife and his son had to help him walk down the hall and outside.  ED Course: Afebrile.  Not tachycardic.  Slightly tachypneic.  Not hypotensive.  COVID-19 rapid test negative.  White count 12.5.  Lactic acid 2.8.  Platelet count 73,000, was 166,000 eleven months ago.  T bili mildly elevated at 1.5, remainder of LFTs normal.  UA with positive nitrite, moderate leukocytes, 21-50 RBCs, 11-20 WBCs, and rare bacteria.  Urine culture pending.  Blood culture x2 pending.  Chest x-ray showing no acute cardiopulmonary abnormality.  Head CT (motion degraded) showing no acute intracranial abnormality. Ceftriaxone and 1.5 L fluid boluses ordered in the ED.   Review of Systems:  All systems reviewed and apart from history of presenting illness, are negative.  Past Medical History:  Diagnosis Date  . Acute pulmonary embolus (HCC) 11/07/2009   pt denies  . Arthritis    neck - limited range of motion; back, shoulders  . Chronic back pain   . Difficult intubation   . DVT of lower extremity (deep  venous thrombosis) (HCC) 11/07/2009  . Occasional tremors 03/2017   HANDS   . Right inguinal hernia 02/2011  . Seizures (HCC)    pt denies  . Syncope and collapse 03/26/2017  . Umbilical hernia 02/2011    Past Surgical History:  Procedure Laterality Date  . CLOSED REDUCTION FINGER WITH PERCUTANEOUS PINNING Left 06/26/2018   Procedure: CLOSED REDUCTION FINGER WITH PERCUTANEOUS PINNING;  Surgeon: Ernest Mallickreighton, James J III, MD;  Location: [facility];  Service: Orthopedics;  Laterality: Left;  . HERNIA REPAIR    . I&D EXTREMITY Left 06/26/2018   Procedure: Left Index finger wound exploration, irrigation and debridement, fracture fixation, flexor tendon repair, digital nerve repair with conduit as needed and surgery as indicated;  Surgeon: Ernest Mallickreighton, James J III, MD;  Location: [facility];  Service: Orthopedics;  Laterality: Left;  90min  . INGUINAL HERNIA REPAIR  03/17/2011   Procedure: HERNIA REPAIR INGUINAL ADULT;  Surgeon: Ernestene MentionHaywood M Ingram, MD;  Location: [facility];  Service: General;  Laterality: Right;  right inguinal hernia repair with mesh  . KNEE SURGERY  1977   left  . NERVE AND TENDON REPAIR Left 06/26/2018   Procedure: NERVE AND TENDON REPAIR;  Surgeon: Ernest Mallickreighton, James J III, MD;  Location: [facility];  Service: Orthopedics;  Laterality: Left;  . UMBILICAL HERNIA REPAIR  03/17/2011   Procedure: HERNIA REPAIR UMBILICAL ADULT;  Surgeon: Ernestene MentionHaywood M Ingram, MD;  Location:  SURGERY CENTER;  Service: General;  Laterality: N/A;  umbilical hernia repair with mesh     reports that he has never smoked. He has never used smokeless tobacco. He reports that he does not drink alcohol or use drugs.  No Known Allergies  Family History  Problem Relation Age of Onset  . Hypertension Other     Prior to Admission medications   Not on File    Physical Exam: Vitals:   09/10/18 1800 09/10/18 1815 09/10/18 1949 09/10/18 2148  BP:  (!) 170/152 113/76 109/85 118/75  Pulse:   79 95  Resp: (!) 25 17 18 18   Temp:      TempSrc:      SpO2:   100% 100%    Physical Exam  Constitutional: He is oriented to person, place, and time. No distress.  Bilateral hand tremors Appears very lethargic  HENT:  Head: Normocephalic.  Dry mucous membranes  Eyes: Right eye exhibits no discharge. Left eye exhibits no discharge.  Neck: Neck supple.  Cardiovascular: Normal rate, regular rhythm and intact distal pulses.  Pulmonary/Chest: Effort normal and breath sounds normal. No respiratory distress. He has no wheezes. He has no rales.  Anterior lung fields clear to auscultation  Abdominal: Soft. Bowel sounds are normal. He exhibits no distension. There is no abdominal tenderness. There is no guarding.  Musculoskeletal:        General: No edema.  Neurological: He is alert and oriented to person, place, and time.  Tongue midline, no facial droop Strength 5 out of 5 in bilateral upper and lower extremities. Sensation to light touch intact throughout.  Skin: Skin is warm and dry. He is not diaphoretic.     Labs on Admission: I have personally reviewed following labs and imaging studies  CBC: Recent Labs  Lab 09/10/18 1922  WBC 12.5*  NEUTROABS 10.1*  HGB 15.3  HCT 49.6  MCV 95.8  PLT 73*   Basic Metabolic Panel: Recent Labs  Lab 09/10/18 1922  NA 137  K 5.1  CL 104  CO2 22  GLUCOSE 94  BUN 13  CREATININE 0.98  CALCIUM 9.0   GFR: CrCl cannot be calculated (Unknown ideal weight.). Liver Function Tests: Recent Labs  Lab 09/10/18 1922  AST 19  ALT 6  ALKPHOS 70  BILITOT 1.5*  PROT 6.8  ALBUMIN 3.4*   No results for input(s): LIPASE, AMYLASE in the last 168 hours. No results for input(s): AMMONIA in the last 168 hours. Coagulation Profile: No results for input(s): INR, PROTIME in the last 168 hours. Cardiac Enzymes: No results for input(s): CKTOTAL, CKMB, CKMBINDEX, TROPONINI in the last 168 hours. BNP  (last 3 results) No results for input(s): PROBNP in the last 8760 hours. HbA1C: No results for input(s): HGBA1C in the last 72 hours. CBG: No results for input(s): GLUCAP in the last 168 hours. Lipid Profile: No results for input(s): CHOL, HDL, LDLCALC, TRIG, CHOLHDL, LDLDIRECT in the last 72 hours. Thyroid Function Tests: No results for input(s): TSH, T4TOTAL, FREET4, T3FREE, THYROIDAB in the last 72 hours. Anemia Panel: No results for input(s): VITAMINB12, FOLATE, FERRITIN, TIBC, IRON, RETICCTPCT in the last 72 hours. Urine analysis:    Component Value Date/Time   COLORURINE AMBER (A) 09/10/2018 1641   APPEARANCEUR CLOUDY (A) 09/10/2018 1641   LABSPEC 1.025 09/10/2018 1641   PHURINE 6.5 09/10/2018 1641   GLUCOSEU 250 (A) 09/10/2018 1641   HGBUR LARGE (A) 09/10/2018 1641   BILIRUBINUR MODERATE (A) 09/10/2018 1641   BILIRUBINUR negative 10/13/2017 1155  BILIRUBINUR NEG 12/04/2013 1458   KETONESUR 15 (A) 09/10/2018 1641   PROTEINUR >300 (A) 09/10/2018 1641   UROBILINOGEN 0.2 10/13/2017 1155   NITRITE POSITIVE (A) 09/10/2018 1641   LEUKOCYTESUR MODERATE (A) 09/10/2018 1641    Radiological Exams on Admission: Ct Head Wo Contrast  Result Date: 09/10/2018 CLINICAL DATA:  Altered LOC EXAM: CT HEAD WITHOUT CONTRAST TECHNIQUE: Contiguous axial images were obtained from the base of the skull through the vertex without intravenous contrast. COMPARISON:  CT brain 01/23/2016 FINDINGS: Brain: Significant motion degradation. No gross mass or hemorrhage. Nonenlarged ventricles. Vascular: No unexpected calcification Skull: No fracture Sinuses/Orbits: Bony expansion of the anterior wall of right maxillary sinus with internal low-density. No acute fluid levels in the sinuses. Other: None IMPRESSION: 1. Motion degraded study. This limits evaluation of the brain. No gross acute intracranial abnormality. 2. Slightly expanded appearance of the anterior wall of right maxillary sinus with associated  lucent areas, possibly an area of fibrous dysplasia or pagetoid change. Electronically Signed   By: Donavan Foil M.D.   On: 09/10/2018 20:56   Dg Chest Portable 1 View  Result Date: 09/10/2018 CLINICAL DATA:  63 year old male recently diagnosed with UTI. Confusion. EXAM: PORTABLE CHEST 1 VIEW COMPARISON:  09/24/2017 and earlier. FINDINGS: Portable AP semi upright view at 1813 hours. Larger lung volumes. Stable mildly tortuous thoracic aorta. Other mediastinal contours are within normal limits. Visualized tracheal air column is within normal limits. Allowing for portable technique the lungs are clear. No pneumothorax or pleural effusion. No acute osseous abnormality identified. IMPRESSION: No acute cardiopulmonary abnormality. Electronically Signed   By: Genevie Ann M.D.   On: 09/10/2018 18:37    EKG: Independently reviewed.  Sinus rhythm, PVCs, borderline T wave abnormality in inferior and lateral leads.  Assessment/Plan Principal Problem:   UTI (urinary tract infection) Active Problems:   Sepsis ([place])   Acute metabolic encephalopathy   Thrombocytopenia (HCC)   Total bilirubin, elevated   Sepsis secondary to UTI Afebrile.  Slightly tachypneic initially in the ED and white count 12.5.  Tachypnea has now resolved.  Lactic acid 2.8. UA with positive nitrite, moderate leukocytes, 21-50 RBCs, 11-20 WBCs, and rare bacteria. -Continue IV fluid -Continue ceftriaxone -Continue to trend lactic acid -Monitor CBC -Urine culture pending -Blood culture x2 pending  Acute metabolic encephalopathy Likely secondary to UTI.  Neuro exam nonfocal.  Head CT negative for acute finding.  Currently AAO x3 but appears very lethargic and is not able to tell me why he is here. -Management of UTI as mentioned above.  If no improvement, consider obtaining brain MRI. -Check TSH, B12, and ammonia levels  Thrombocytopenia Platelet count 73,000, was 166,000 eleven months ago.  No signs of active bleeding. -Repeat CBC  in am to confirm  Mildly elevated T bili T bili mildly elevated at 1.5, remainder of LFTs normal.   Possibly related to sepsis. -Continue to monitor LFTs  Bilateral hand tremors Followed by outpatient neurology and tremors are thought to be a variation of essential tremor.  He was referred to neurology at [facility] for further evaluation.  Abnormal CT finding of sinuse Head CT showing slightly expanded appearance of the anterior wall of right maxillary sinus with associated lucent areas, possibly an area of fibrous dysplasia or pagetoid change. -Outpatient follow-up with PCP  Abnormal EKG EKG with borderline T wave abnormality in inferior and lateral leads.  Patient denies any chest pain and appears comfortable on exam. -Cardiac monitoring -Check high-sensitivity troponin level  Physical deconditioning Likely  related to underlying infection/UTI. -PT evaluation  DVT prophylaxis: SCDs given thrombocytopenia Code Status: Full code Family Communication: No family available at this time. Disposition Plan: Anticipate discharge after clinical improvement. Consults called: None Admission status: It is my clinical opinion that admission to INPATIENT is reasonable and necessary in this 63 y.o. male . presenting with sepsis and acute metabolic encephalopathy secondary to UTI.  Currently on IV ceftriaxone and will need continued monitoring for improvement in mental status.  If no improvement with treatment of UTI, will need further work-up.  Given the aforementioned, the predictability of an adverse outcome is felt to be significant. I expect that the patient will require at least 2 midnights in the hospital to treat this condition.   The medical decision making on this patient was of high complexity and the patient is at high risk for clinical deterioration, therefore this is a level 3 visit.  John GiovanniVasundhra Christyna Letendre MD [facility] Pager [phone]- [phone]  If 7PM-7AM, please contact  night-coverage [email or web address] Password [email or web address]  09/10/2018, 10:09 PM

## 2018-09-10 NOTE — ED Notes (Signed)
IV team at bedside 

## 2018-09-10 NOTE — ED Notes (Addendum)
Patient has been stuck multiple times today. Called lab to see if they can add on his additional labs. They could not.

## 2018-09-10 NOTE — ED Provider Notes (Signed)
[facility] Provider Note   CSN: [id number] Arrival date & time: 09/10/18  1634    History   Chief Complaint No chief complaint on file.   HPI Cory CoasterKenneth B Solivan Sr. is a 63 y.o. male with history of acute PE, chronic back pain, DVT, bilateral hand tremors presents for evaluation of acute onset, progressively worsening altered mental status.  The patient himself tells me that he has been feeling unwell but cannot elaborate.  He denies any syncope.  He states that he has an irregular heart rate but when I asked how he knows this he cannot answer this question.  Per chart review using care everywhere, there was a telephone encounter today to the patient's PCP which states that the patient's wife called stating the patient has been experiencing generalized weakness and malaise.  He has been wanting to sleep more and has required assistance by his wife and son to help him walk down the hall and outside.  He was advised to come to the ED for further evaluation.  On my assessment the patient denies any headache, lightheadedness, chest pain, shortness of breath, abdominal pain, vomiting, or urinary symptoms.  He does tell me he feels a little nauseated.      The history is provided by the patient and medical records.    Past Medical History:  Diagnosis Date  . Acute pulmonary embolus (HCC) 11/07/2009   pt denies  . Arthritis    neck - limited range of motion; back, shoulders  . Chronic back pain   . Difficult intubation   . DVT of lower extremity (deep venous thrombosis) (HCC) 11/07/2009  . Occasional tremors 03/2017   HANDS   . Right inguinal hernia 02/2011  . Seizures (HCC)    pt denies  . Syncope and collapse 03/26/2017  . Umbilical hernia 02/2011    Patient Active Problem List   Diagnosis Date Noted  . UTI (urinary tract infection) 09/10/2018  . 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 REM sleep behavior  disorder 09/17/2017  . Tremor 05/08/2017  . Gait abnormality 05/08/2017  . Transient hypotension 05/05/2017  . Malnutrition of moderate degree 03/28/2017  . Syncope and collapse 03/26/2017  . Subungual hematoma of great toe of right foot 12/13/2016  . Pain of toe of right foot 12/13/2016  . Paresthesia 06/29/2016  . Tremor of right hand 06/29/2016  . Mild cognitive impairment 06/29/2016  . Tremors of nervous system 06/22/2016  . Drooling 06/22/2016  . Loss of weight 06/22/2016  . Family history of throat cancer 06/22/2016  . Abnormality of gait 06/09/2016  . Acute bronchitis 03/20/2016  . Memory loss 12/31/2013  . Back pain 08/18/2013  . Arthritis   . Myoclonic jerking   . Right inguinal hernia 03/17/2011  . Umbilical hernia 03/17/2011  . Acute pulmonary embolus (HCC) 11/07/2009  . DVT of lower extremity (deep venous thrombosis) (HCC) 11/07/2009    Past Surgical History:  Procedure Laterality Date  . CLOSED REDUCTION FINGER WITH PERCUTANEOUS PINNING Left 06/26/2018   Procedure: CLOSED REDUCTION FINGER WITH PERCUTANEOUS PINNING;  Surgeon: Ernest Mallickreighton, James J III, MD;  Location: [facility];  Service: Orthopedics;  Laterality: Left;  . HERNIA REPAIR    . I&D EXTREMITY Left 06/26/2018   Procedure: Left Index finger wound exploration, irrigation and debridement, fracture fixation, flexor tendon repair, digital nerve repair with conduit as needed and surgery as indicated;  Surgeon: Ernest Mallickreighton, James J III, MD;  Location:  [place];  Service: Orthopedics;  Laterality: Left;  72min  . INGUINAL HERNIA REPAIR  03/17/2011   Procedure: HERNIA REPAIR INGUINAL ADULT;  Surgeon: Adin Hector, MD;  Location: [place];  Service: General;  Laterality: Right;  right inguinal hernia repair with mesh  . [place]   left  . NERVE AND TENDON REPAIR Left 06/26/2018   Procedure: NERVE AND TENDON REPAIR;  Surgeon: Verner Mould, MD;   Location: [place];  Service: Orthopedics;  Laterality: Left;  . UMBILICAL HERNIA REPAIR  03/17/2011   Procedure: HERNIA REPAIR UMBILICAL ADULT;  Surgeon: Adin Hector, MD;  Location: [place];  Service: General;  Laterality: N/A;  umbilical hernia repair with mesh        Home Medications    Prior to Admission medications   Not on File    Family History Family History  Problem Relation Age of Onset  . Hypertension Other     Social History Social History   Tobacco Use  . Smoking status: Never Smoker  . Smokeless tobacco: Never Used  Substance Use Topics  . Alcohol use: No  . Drug use: No     Allergies   Patient has no known allergies.   Review of Systems Review of Systems  Constitutional: Positive for fatigue. Negative for chills and fever.  Respiratory: Negative for shortness of breath.   Cardiovascular: Negative for chest pain.  Gastrointestinal: Positive for nausea. Negative for abdominal pain and vomiting.  Neurological: Positive for weakness and light-headedness. Negative for syncope.  Psychiatric/Behavioral: Positive for confusion.  All other systems reviewed and are negative.    Physical Exam Updated Vital Signs BP 118/75 (BP Location: Left Arm)   Pulse 95   Temp 98.5 F (36.9 C) (Oral)   Resp 18   SpO2 100%   Physical Exam Vitals signs and nursing note reviewed.  Constitutional:      General: He is not in acute distress.    Appearance: He is well-developed.  HENT:     Head: Normocephalic and atraumatic.  Eyes:     General:        Right eye: No discharge.        Left eye: No discharge.     Extraocular Movements: Extraocular movements intact.     Conjunctiva/sclera: Conjunctivae normal.     Pupils: Pupils are equal, round, and reactive to light.  Neck:     Vascular: No JVD.     Trachea: No tracheal deviation.  Cardiovascular:     Rate and Rhythm: Normal rate and regular rhythm.     Pulses: Normal  pulses.     Heart sounds: Normal heart sounds.  Pulmonary:     Effort: Pulmonary effort is normal.     Breath sounds: Normal breath sounds.  Abdominal:     General: Bowel sounds are normal. There is no distension.     Palpations: Abdomen is soft.     Tenderness: There is no abdominal tenderness. There is no guarding or rebound.     Comments: Condom cath in place  Skin:    General: Skin is warm and dry.     Findings: No erythema.  Neurological:     Mental Status: He is alert and oriented to person, place, and time. He is confused.     GCS: GCS eye subscore is 4. GCS verbal subscore is 5. GCS motor subscore is 6.     Motor:  Tremor present.     Comments: Intermittent tremulousness of the bilateral upper extremities.  Improves on active use of his hands.  5/5 strength of BUE and BLE major muscle groups.Sensation intact to soft touch of face and extremities.  Cranial nerves II through XII tested and intact.  Patient slow to answer some questions but he is alert and oriented to person, place, and time.  He is confused as to events resulting in his presentation to the ED.  He follows commands without difficulty.    Psychiatric:        Behavior: Behavior normal.      ED Treatments / Results  Labs (all labs ordered are listed, but only abnormal results are displayed) Labs Reviewed  CBC WITH DIFFERENTIAL/PLATELET - Abnormal; Notable for the following components:      Result Value   WBC 12.5 (*)    Platelets 73 (*)    Neutro Abs 10.1 (*)    Monocytes Absolute 1.3 (*)    All other components within normal limits  COMPREHENSIVE METABOLIC PANEL - Abnormal; Notable for the following components:   Albumin 3.4 (*)    Total Bilirubin 1.5 (*)    All other components within normal limits  URINALYSIS, ROUTINE W REFLEX MICROSCOPIC - Abnormal; Notable for the following components:   Color, Urine AMBER (*)    APPearance CLOUDY (*)    Glucose, UA 250 (*)    Hgb urine dipstick LARGE (*)     Bilirubin Urine MODERATE (*)    Ketones, ur 15 (*)    Protein, ur >300 (*)    Nitrite POSITIVE (*)    Leukocytes,Ua MODERATE (*)    All other components within normal limits  LACTIC ACID, PLASMA - Abnormal; Notable for the following components:   Lactic Acid, Venous 2.8 (*)    All other components within normal limits  URINALYSIS, MICROSCOPIC (REFLEX) - Abnormal; Notable for the following components:   Bacteria, UA RARE (*)    All other components within normal limits  SARS CORONAVIRUS 2 (HOSPITAL ORDER, PERFORMED IN [facility])  URINE CULTURE  CULTURE, BLOOD (ROUTINE X 2)  CULTURE, BLOOD (ROUTINE X 2)  HIV ANTIBODY (ROUTINE TESTING W REFLEX)  CBC  HEPATIC FUNCTION PANEL  LACTIC ACID, PLASMA  TROPONIN I (HIGH SENSITIVITY)    EKG None  Radiology Ct Head Wo Contrast  Result Date: 09/10/2018 CLINICAL DATA:  Altered LOC EXAM: CT HEAD WITHOUT CONTRAST TECHNIQUE: Contiguous axial images were obtained from the base of the skull through the vertex without intravenous contrast. COMPARISON:  CT brain 01/23/2016 FINDINGS: Brain: Significant motion degradation. No gross mass or hemorrhage. Nonenlarged ventricles. Vascular: No unexpected calcification Skull: No fracture Sinuses/Orbits: Bony expansion of the anterior wall of right maxillary sinus with internal low-density. No acute fluid levels in the sinuses. Other: None IMPRESSION: 1. Motion degraded study. This limits evaluation of the brain. No gross acute intracranial abnormality. 2. Slightly expanded appearance of the anterior wall of right maxillary sinus with associated lucent areas, possibly an area of fibrous dysplasia or pagetoid change. Electronically Signed   By: Jasmine PangKim  Fujinaga M.D.   On: 09/10/2018 20:56   Dg Chest Portable 1 View  Result Date: 09/10/2018 CLINICAL DATA:  63 year old male recently diagnosed with UTI. Confusion. EXAM: PORTABLE CHEST 1 VIEW COMPARISON:  09/24/2017 and earlier. FINDINGS: Portable AP semi  upright view at 1813 hours. Larger lung volumes. Stable mildly tortuous thoracic aorta. Other mediastinal contours are within normal limits. Visualized tracheal air column is  within normal limits. Allowing for portable technique the lungs are clear. No pneumothorax or pleural effusion. No acute osseous abnormality identified. IMPRESSION: No acute cardiopulmonary abnormality. Electronically Signed   By: Odessa FlemingH  Hall M.D.   On: 09/10/2018 18:37    Procedures Procedures (including critical care time)  Medications Ordered in ED Medications  sodium chloride 0.9 % bolus 500 mL (has no administration in time range)  cefTRIAXone (ROCEPHIN) 1 g in sodium chloride 0.9 % 100 mL IVPB (1 g Intravenous New Bag/Given 09/10/18 2147)  acetaminophen (TYLENOL) tablet 650 mg (has no administration in time range)    Or  acetaminophen (TYLENOL) suppository 650 mg (has no administration in time range)  sodium chloride 0.9 % bolus 1,000 mL (1,000 mLs Intravenous New Bag/Given 09/10/18 2145)     Initial Impression / Assessment and Plan / ED Course  I have reviewed the triage vital signs and the nursing notes.  Pertinent labs & imaging results that were available during my care of the patient were reviewed by me and considered in my medical decision making (see chart for details).        Patient presents for evaluation of malaise, altered mental status.  He is afebrile, vital signs stable.  Nontoxic in appearance.  Abdomen soft and nontender.  No focal neurologic deficits on examination.  He has a UTI on UA, will culture and start antibiotics.  Lab work significant for leukocytosis and mildly elevated lactate thus meet sepsis criteria.  Will start IV fluids, IV antibiotics, obtain blood cultures, and admit to the hospitalist service.  His abdomen is soft and nontender and I doubt acute surgical abdominal pathology.  Head CT shows no acute intracranial abnormality however is motion degraded.  Doubt CVA given reassuring  neurologic examination. Chest x-ray shows no acute cardiopulmonary abnormalities.  No evidence of pneumonia.  Antibiotics and fluid administration were delayed somewhat as it was difficult to obtain IV access on the patient.  Spoke with Dr. Loney Lohathore with [facility] hospitalist service who agrees to assume care of patient and bring him into the hospital for further evaluation and management.  Final Clinical Impressions(s) / ED Diagnoses   Final diagnoses:  Acute cystitis with hematuria  Acute confusion    ED Discharge Orders    None       Bennye AlmFawze, [place] Prout A, PA-C 09/10/18 2201    Donnetta Hutchingook, Brian, MD 09/17/18 [phone]

## 2018-09-11 ENCOUNTER — Encounter (HOSPITAL_COMMUNITY): Payer: Self-pay | Admitting: *Deleted

## 2018-09-11 DIAGNOSIS — G9341 Metabolic encephalopathy: Secondary | ICD-10-CM

## 2018-09-11 DIAGNOSIS — R6520 Severe sepsis without septic shock: Secondary | ICD-10-CM

## 2018-09-11 DIAGNOSIS — G9340 Encephalopathy, unspecified: Secondary | ICD-10-CM

## 2018-09-11 DIAGNOSIS — D696 Thrombocytopenia, unspecified: Secondary | ICD-10-CM

## 2018-09-11 DIAGNOSIS — A419 Sepsis, unspecified organism: Principal | ICD-10-CM

## 2018-09-11 LAB — HEPATIC FUNCTION PANEL
ALT: 7 U/L (ref 0–44)
AST: 11 U/L — ABNORMAL LOW (ref 15–41)
Albumin: 2.9 g/dL — ABNORMAL LOW (ref 3.5–5.0)
Alkaline Phosphatase: 57 U/L (ref 38–126)
Bilirubin, Direct: 0.4 mg/dL — ABNORMAL HIGH (ref 0.0–0.2)
Indirect Bilirubin: 0.8 mg/dL (ref 0.3–0.9)
Total Bilirubin: 1.2 mg/dL (ref 0.3–1.2)
Total Protein: 5.9 g/dL — ABNORMAL LOW (ref 6.5–8.1)

## 2018-09-11 LAB — HIV ANTIBODY (ROUTINE TESTING W REFLEX): HIV Screen 4th Generation wRfx: NONREACTIVE

## 2018-09-11 LAB — CBC
HCT: 39 % (ref 39.0–52.0)
Hemoglobin: 12.7 g/dL — ABNORMAL LOW (ref 13.0–17.0)
MCH: 29.4 pg (ref 26.0–34.0)
MCHC: 32.6 g/dL (ref 30.0–36.0)
MCV: 90.3 fL (ref 80.0–100.0)
Platelets: 145 10*3/uL — ABNORMAL LOW (ref 150–400)
RBC: 4.32 MIL/uL (ref 4.22–5.81)
RDW: 13.1 % (ref 11.5–15.5)
WBC: 11.4 10*3/uL — ABNORMAL HIGH (ref 4.0–10.5)
nRBC: 0 % (ref 0.0–0.2)

## 2018-09-11 LAB — AMMONIA: Ammonia: 12 umol/L (ref 9–35)

## 2018-09-11 LAB — TROPONIN I (HIGH SENSITIVITY): Troponin I (High Sensitivity): 4 ng/L (ref <18)

## 2018-09-11 LAB — TSH: TSH: 0.268 u[IU]/mL — ABNORMAL LOW (ref 0.350–4.500)

## 2018-09-11 LAB — VITAMIN B12: Vitamin B-12: 350 pg/mL (ref 180–914)

## 2018-09-11 MED ORDER — PROMETHAZINE HCL 25 MG/ML IJ SOLN
12.5000 mg | Freq: Once | INTRAMUSCULAR | Status: AC
  Administered 2018-09-12: 12.5 mg via INTRAVENOUS
  Filled 2018-09-11: qty 1

## 2018-09-11 MED ORDER — ENOXAPARIN SODIUM 40 MG/0.4ML ~~LOC~~ SOLN
40.0000 mg | SUBCUTANEOUS | Status: DC
  Administered 2018-09-11: 40 mg via SUBCUTANEOUS
  Filled 2018-09-11: qty 0.4

## 2018-09-11 MED ORDER — VITAMIN B-12 100 MCG PO TABS
500.0000 ug | ORAL_TABLET | Freq: Every day | ORAL | Status: DC
  Administered 2018-09-11 – 2018-09-12 (×2): 500 ug via ORAL
  Filled 2018-09-11 (×2): qty 5

## 2018-09-11 MED ORDER — ONDANSETRON HCL 4 MG/2ML IJ SOLN: 4.0000 mg | Freq: Four times a day (QID) | INTRAMUSCULAR | Status: DC | PRN

## 2018-09-11 MED ORDER — SODIUM CHLORIDE 0.9 % IV SOLN
INTRAVENOUS | Status: AC
  Administered 2018-09-11: 21:00:00 via INTRAVENOUS

## 2018-09-11 NOTE — Evaluation (Signed)
Physical Therapy Evaluation Patient Details Name: Cory ULYSSE Sr. MRN: [id number] DOB: 03/26/1955 Today's Date: 09/11/2018   History of Present Illness  Cory ROSTEN Sr. is a 63 y.o. male with medical history significant of VTE, seizures, chronic back pain, arthritis, essential tremor presenting to the hospital for altered mental status.  Patient is AAO x3 but not sure why he is here. Found to be septic 2/2 UTI     Clinical Impression  Pt admitted with above diagnosis. Pt currently with functional limitations due to the deficits listed below (see PT Problem List). PTA, pt independent at home living with wife in 2 story home, ambulating without AD. Today slow processing and mobility, several posterior LOB requiring hands on guarding to correct. anticipate he will progress well with therapy, at this time unsafe to return home.  Pt will benefit from skilled PT to increase their independence and safety with mobility to allow discharge to the venue listed below.       Follow Up Recommendations SNF(may be able to progress to HHPT)    Equipment Recommendations  (TBD pending progress)    Recommendations for Other Services OT consult     Precautions / Restrictions Precautions Precautions: Fall Restrictions Weight Bearing Restrictions: No      Mobility  Bed Mobility Overal bed mobility: Modified Independent                Transfers Overall transfer level: Needs assistance Equipment used: Rolling walker (2 wheeled) Transfers: Sit to/from Stand Sit to Stand: Min assist;Min guard         General transfer comment: 2x LOB upon standing posterior back onto bed. poor use of RW and hand placement. cues throughout  Ambulation/Gait Ambulation/Gait assistance: Min assist Gait Distance (Feet): 15 Feet Assistive device: Rolling walker (2 wheeled) Gait Pattern/deviations: Step-to pattern Gait velocity: decreased   General Gait Details: 10' to the bathroom and back,  extensive time in bathroom due to slow movement/processing.   Stairs            Wheelchair Mobility    Modified Rankin (Stroke Patients Only)       Balance Overall balance assessment: Needs assistance   Sitting balance-Leahy Scale: Fair       Standing balance-Leahy Scale: Poor                               Pertinent Vitals/Pain Pain Assessment: No/denies pain    Home Living Family/patient expects to be discharged to:: Private residence Living Arrangements: Spouse/significant other Available Help at Discharge: Family Type of Home: House       Home Layout: One level Home Equipment: None      Prior Function Level of Independence: Independent               Hand Dominance   Dominant Hand: Right    Extremity/Trunk Assessment   Upper Extremity Assessment Upper Extremity Assessment: Generalized weakness    Lower Extremity Assessment Lower Extremity Assessment: Generalized weakness       Communication   Communication: No difficulties  Cognition Arousal/Alertness: Awake/alert Behavior During Therapy: WFL for tasks assessed/performed;Flat affect Overall Cognitive Status: No family/caregiver present to determine baseline cognitive functioning                                 General Comments: AOx3 slow processing and at times a bit impulsive  General Comments      Exercises     Assessment/Plan    PT Assessment Patient needs continued PT services  PT Problem List Decreased strength       PT Treatment Interventions DME instruction;Stair training;Gait training;Functional mobility training;Therapeutic activities;Therapeutic exercise;Balance training    PT Goals (Current goals can be found in the Care Plan section)  Acute Rehab PT Goals Patient Stated Goal: go home PT Goal Formulation: With patient Time For Goal Achievement: 09/25/18 Potential to Achieve Goals: Good    Frequency Min 3X/week   Barriers to  discharge        Co-evaluation               AM-PAC PT "6 Clicks" Mobility  Outcome Measure Help needed turning from your back to your side while in a flat bed without using bedrails?: A Little Help needed moving from lying on your back to sitting on the side of a flat bed without using bedrails?: A Little Help needed moving to and from a bed to a chair (including a wheelchair)?: A Lot Help needed standing up from a chair using your arms (e.g., wheelchair or bedside chair)?: A Lot Help needed to walk in hospital room?: A Lot Help needed climbing 3-5 steps with a railing? : Total 6 Click Score: 13    End of Session Equipment Utilized During Treatment: Gait belt Activity Tolerance: Patient tolerated treatment well Patient left: in bed;with call bell/phone within reach;with bed alarm set Nurse Communication: Mobility status PT Visit Diagnosis: Unsteadiness on feet (R26.81)    Time: 1720-1745 PT Time Calculation (min) (ACUTE ONLY): 25 min   Charges:   PT Evaluation $PT Eval Moderate Complexity: 1 Mod PT Treatments $Therapeutic Activity: 8-22 mins       Cory Johnson, PT, DPT Acute Rehabilitation Services Pager: [phone] Office: [phone]   Cory Johnson 09/11/2018, 5:45 PM

## 2018-09-11 NOTE — Progress Notes (Signed)
Pt wife updated on pt of care.

## 2018-09-11 NOTE — Progress Notes (Addendum)
PROGRESS NOTE    Cory CURRIER Sr.  HBZ:[id number] DOB: 08-02-1955 DOA: 09/10/2018 PCP: [name], MD    Brief Narrative:  63 year old male with a history of chronic back pain, seizures, brought to the hospital with altered mental status.  Found to have sepsis secondary to urinary tract infection.  Admitted for intravenous antibiotics and IV fluids.   Assessment & Plan:   Principal Problem:   UTI (urinary tract infection) Active Problems:   Sepsis ([place])   Acute metabolic encephalopathy   Thrombocytopenia ([facility])   Total bilirubin, elevated   1. Sepsis secondary to urinary tract infection.  Clinically is improving.  Hemodynamics are stabilizing.  He is currently on ceftriaxone.  Urine cultures in process.  Continue current treatments. He does describe a longstanding history of recurrent UTI with treatment courses and is followed by urology. This raises concern for a resistant organism. Given he is still having periods of confusion, to reduce his risk of treatment failure, it is my medical opinion that further inpatient treatment with IV antibiotics while awaiting definitive culture results would be the safest option for this patient.  2. Thrombocytopenia.  Secondary to sepsis.  This is improved. 3. Acute metabolic encephalopathy.  Secondary to sepsis.  Mental status appears to be returning to baseline, although he still has periods of confusion per staff.  He is currently awake alert and appears to be coherent.  Vitamin B12 checked and noted to be in the lower range of normal.  Will start on replacement. 4. Generalized weakness.  Patient seems unsteady on his feet. Will request PT evaluation to see if he qualifies for SNF placement.   DVT prophylaxis: Lovenox Code Status: Full code Family Communication: Left voicemail for patient's wife Disposition Plan: Pending physical therapy evaluation   Consultants:     Procedures:     Antimicrobials:   Ceftriaxone 6/30 >    Subjective: Staff reports that he is still having intermittent periods of confusion, but overall he is confusion has mildly improved since admission  Objective: Vitals:   09/11/18 0359 09/11/18 0557 09/11/18 0954 09/11/18 1559  BP: 96/62  121/77 113/73  Pulse: 71  70 60  Resp: 16  19 (!) 21  Temp: 98.7 F (37.1 C)  98.6 F (37 C) 98.2 F (36.8 C)  TempSrc: Oral  Oral Oral  SpO2: 100%  100% 100%  Weight:  81.3 kg    Height:        Intake/Output Summary (Last 24 hours) at 09/11/2018 1944 Last data filed at 09/11/2018 1859 Gross per 24 hour  Intake 1219.14 ml  Output 750 ml  Net 469.14 ml   Filed Weights   09/11/18 0557  Weight: 81.3 kg    Examination:  General exam: Appears calm and comfortable  Respiratory system: Clear to auscultation. Respiratory effort normal. Cardiovascular system: S1 & S2 heard, RRR. No JVD, murmurs, rubs, gallops or clicks. No pedal edema. Gastrointestinal system: Abdomen is nondistended, soft and nontender. No organomegaly or masses felt. Normal bowel sounds heard. Central nervous system: Alert and oriented. No focal neurological deficits.  Resting tremor in right upper extremity Extremities: Symmetric 5 x 5 power. Skin: No rashes, lesions or ulcers Psychiatry: Judgement and insight appear normal. Mood & affect appropriate.     Data Reviewed: I have personally reviewed following labs and imaging studies  CBC: Recent Labs  Lab 09/10/18 1922 09/11/18 0525  WBC 12.5* 11.4*  NEUTROABS 10.1*  --   HGB 15.3 12.7*  HCT 49.6 39.0  MCV 95.8 90.3  PLT 73* 145*   Basic Metabolic Panel: Recent Labs  Lab 09/10/18 1922  NA 137  K 5.1  CL 104  CO2 22  GLUCOSE 94  BUN 13  CREATININE 0.98  CALCIUM 9.0   GFR: Estimated Creatinine Clearance: 86.4 mL/min (by C-G formula based on SCr of 0.98 mg/dL). Liver Function Tests: Recent Labs  Lab 09/10/18 1922 09/11/18 0525  AST 19 11*  ALT 6 7  ALKPHOS 70 57  BILITOT 1.5* 1.2  PROT 6.8 5.9*   ALBUMIN 3.4* 2.9*   No results for input(s): LIPASE, AMYLASE in the last 168 hours. Recent Labs  Lab 09/11/18 0000  AMMONIA 12   Coagulation Profile: No results for input(s): INR, PROTIME in the last 168 hours. Cardiac Enzymes: No results for input(s): CKTOTAL, CKMB, CKMBINDEX, TROPONINI in the last 168 hours. BNP (last 3 results) No results for input(s): PROBNP in the last 8760 hours. HbA1C: No results for input(s): HGBA1C in the last 72 hours. CBG: No results for input(s): GLUCAP in the last 168 hours. Lipid Profile: No results for input(s): CHOL, HDL, LDLCALC, TRIG, CHOLHDL, LDLDIRECT in the last 72 hours. Thyroid Function Tests: Recent Labs    09/11/18 0000  TSH 0.268*   Anemia Panel: Recent Labs    09/11/18 0000  VITAMINB12 350   Sepsis Labs: Recent Labs  Lab 09/10/18 1922 09/10/18 2138  LATICACIDVEN 2.8* 1.1    Recent Results (from the past 240 hour(s))  SARS Coronavirus 2 (CEPHEID- Performed in [facility] lab), Hosp Order     Status: None   Collection Time: 09/10/18  5:44 PM   Specimen: Nasopharyngeal Swab  Result Value Ref Range Status   SARS Coronavirus 2 NEGATIVE NEGATIVE Final    Comment: (NOTE) If result is NEGATIVE SARS-CoV-2 target nucleic acids are NOT DETECTED. The SARS-CoV-2 RNA is generally detectable in upper and lower  respiratory specimens during the acute phase of infection. The lowest  concentration of SARS-CoV-2 viral copies this assay can detect is 250  copies / mL. A negative result does not preclude SARS-CoV-2 infection  and should not be used as the sole basis for treatment or other  patient management decisions.  A negative result may occur with  improper specimen collection / handling, submission of specimen other  than nasopharyngeal swab, presence of viral mutation(s) within the  areas targeted by this assay, and inadequate number of viral copies  (<250 copies / mL). A negative result must be combined with clinical   observations, patient history, and epidemiological information. If result is POSITIVE SARS-CoV-2 target nucleic acids are DETECTED. The SARS-CoV-2 RNA is generally detectable in upper and lower  respiratory specimens dur ing the acute phase of infection.  Positive  results are indicative of active infection with SARS-CoV-2.  Clinical  correlation with patient history and other diagnostic information is  necessary to determine patient infection status.  Positive results do  not rule out bacterial infection or co-infection with other viruses. If result is PRESUMPTIVE POSTIVE SARS-CoV-2 nucleic acids MAY BE PRESENT.   A presumptive positive result was obtained on the submitted specimen  and confirmed on repeat testing.  While 2019 novel coronavirus  (SARS-CoV-2) nucleic acids may be present in the submitted sample  additional confirmatory testing may be necessary for epidemiological  and / or clinical management purposes  to differentiate between  SARS-CoV-2 and other Sarbecovirus currently known to infect humans.  If clinically indicated additional testing with an alternate test  methodology (  [id number]) is advised. The SARS-CoV-2 RNA is generally  detectable in upper and lower respiratory sp ecimens during the acute  phase of infection. The expected result is Negative. Fact Sheet for Patients:  BoilerBrush.com.cyhttps://www.fda.gov/media/136312/download Fact Sheet for Healthcare Providers: https://pope.com/https://www.fda.gov/media/136313/download This test is not yet approved or cleared by the [place] FDA and has been authorized for detection and/or diagnosis of SARS-CoV-2 by FDA under an Emergency Use Authorization (EUA).  This EUA will remain in effect (meaning this test can be used) for the duration of the COVID-19 declaration under Section 564(b)(1) of the Act, 21 U.S.C. section 360bbb-3(b)(1), unless the authorization is terminated or revoked sooner. Performed at [facility], [address]  [place], [place] [address]   Blood Culture (routine x 2)     Status: None (Preliminary result)   Collection Time: 09/10/18  9:33 PM   Specimen: BLOOD  Result Value Ref Range Status   Specimen Description BLOOD LEFT ARM  Final   Special Requests   Final    BOTTLES DRAWN AEROBIC AND ANAEROBIC Blood Culture adequate volume   Culture   Final    NO GROWTH < 12 HOURS Performed at [facility], [address] [place], [place] [address]    Report Status PENDING  Incomplete  Blood Culture (routine x 2)     Status: None (Preliminary result)   Collection Time: 09/10/18  9:33 PM   Specimen: BLOOD  Result Value Ref Range Status   Specimen Description BLOOD RIGHT ARM  Final   Special Requests   Final    BOTTLES DRAWN AEROBIC ONLY Blood Culture results may not be optimal due to an excessive volume of blood received in culture bottles   Culture   Final    NO GROWTH < 12 HOURS Performed at [facility], [address] [place], [place] [address]    Report Status PENDING  Incomplete         Radiology Studies: Ct Head Wo Contrast  Result Date: 09/10/2018 CLINICAL DATA:  Altered LOC EXAM: CT HEAD WITHOUT CONTRAST TECHNIQUE: Contiguous axial images were obtained from the base of the skull through the vertex without intravenous contrast. COMPARISON:  CT brain 01/23/2016 FINDINGS: Brain: Significant motion degradation. No gross mass or hemorrhage. Nonenlarged ventricles. Vascular: No unexpected calcification Skull: No fracture Sinuses/Orbits: Bony expansion of the anterior wall of right maxillary sinus with internal low-density. No acute fluid levels in the sinuses. Other: None IMPRESSION: 1. Motion degraded study. This limits evaluation of the brain. No gross acute intracranial abnormality. 2. Slightly expanded appearance of the anterior wall of right maxillary sinus with associated lucent areas, possibly an area of fibrous dysplasia or pagetoid change. Electronically Signed   By: Jasmine PangKim  Fujinaga  M.D.   On: 09/10/2018 20:56   Dg Chest Portable 1 View  Result Date: 09/10/2018 CLINICAL DATA:  63 year old male recently diagnosed with UTI. Confusion. EXAM: PORTABLE CHEST 1 VIEW COMPARISON:  09/24/2017 and earlier. FINDINGS: Portable AP semi upright view at 1813 hours. Larger lung volumes. Stable mildly tortuous thoracic aorta. Other mediastinal contours are within normal limits. Visualized tracheal air column is within normal limits. Allowing for portable technique the lungs are clear. No pneumothorax or pleural effusion. No acute osseous abnormality identified. IMPRESSION: No acute cardiopulmonary abnormality. Electronically Signed   By: Odessa FlemingH  Hall M.D.   On: 09/10/2018 18:37        Scheduled Meds: Continuous Infusions: . cefTRIAXone (ROCEPHIN)  IV Stopped (09/10/18 2217)  LOS: 1 day    Time spent: 35mins    Erick BlinksJehanzeb TRUE Shackleford, MD [facility]   If 7PM-7AM, please contact night-coverage [email or web address]  09/11/2018, 7:44 PM

## 2018-09-12 LAB — BASIC METABOLIC PANEL
Anion gap: 8 (ref 5–15)
BUN: 13 mg/dL (ref 8–23)
CO2: 28 mmol/L (ref 22–32)
Calcium: 8.9 mg/dL (ref 8.9–10.3)
Chloride: 103 mmol/L (ref 98–111)
Creatinine, Ser: 0.75 mg/dL (ref 0.61–1.24)
GFR calc Af Amer: >60 mL/min (ref >60)
GFR calc non Af Amer: >60 mL/min (ref >60)
Glucose, Bld: 122 mg/dL — ABNORMAL HIGH (ref 70–99)
Potassium: 3.3 mmol/L — ABNORMAL LOW (ref 3.5–5.1)
Sodium: 139 mmol/L (ref 135–145)

## 2018-09-12 LAB — CBC
HCT: 40.7 % (ref 39.0–52.0)
Hemoglobin: 13.3 g/dL (ref 13.0–17.0)
MCH: 29.2 pg (ref 26.0–34.0)
MCHC: 32.7 g/dL (ref 30.0–36.0)
MCV: 89.5 fL (ref 80.0–100.0)
Platelets: 148 10*3/uL — ABNORMAL LOW (ref 150–400)
RBC: 4.55 MIL/uL (ref 4.22–5.81)
RDW: 12.9 % (ref 11.5–15.5)
WBC: 10.7 10*3/uL — ABNORMAL HIGH (ref 4.0–10.5)
nRBC: 0 % (ref 0.0–0.2)

## 2018-09-12 LAB — URINE CULTURE: Culture: 10000 — AB

## 2018-09-12 MED ORDER — CYANOCOBALAMIN 500 MCG PO TABS: 500.0000 ug | ORAL_TABLET | Freq: Every day | ORAL | 0 refills | Status: DC

## 2018-09-12 MED ORDER — POTASSIUM CHLORIDE CRYS ER 20 MEQ PO TBCR
40.0000 meq | EXTENDED_RELEASE_TABLET | Freq: Once | ORAL | Status: AC
  Administered 2018-09-12: 40 meq via ORAL
  Filled 2018-09-12: qty 2

## 2018-09-12 MED ORDER — SULFAMETHOXAZOLE-TRIMETHOPRIM 800-160 MG PO TABS: 1.0000 | ORAL_TABLET | Freq: Two times a day (BID) | ORAL | 0 refills | Status: DC

## 2018-09-12 NOTE — Progress Notes (Signed)
DISCHARGE NOTE HOME Cory Enriquez Schepp Sr. to be discharged Home per MD order. Discussed prescriptions and follow up appointments with the patient. Prescriptions given to patient's wife; medication list explained in detail. Patient wife verbalized understanding.  Skin clean, dry and intact without evidence of skin break down, no evidence of skin tears noted. IV catheter discontinued intact. Site without signs and symptoms of complications. Dressing and pressure applied. Pt denies pain at the site currently. No complaints noted.  Patient free of lines, drains, and wounds.   An After Visit Summary (AVS) was printed and given to the patient. Patient escorted via wheelchair, and discharged home via private auto.  [name], RN

## 2018-09-12 NOTE — TOC Transition Note (Addendum)
Transition of Care [facility]) - CM/SW Discharge Note   Patient Details  Name: Cory KLUTZ Sr. MRN: [id number] Date of Birth: 06-23-1955  Transition of Care [facility]) CM/SW Contact:  Zenon Mayo, RN Phone Number: 09/12/2018, 1:43 PM   Clinical Narrative:    Patient is for discharge today, will need HHPT, rolling walker and BSC.  NCM offered choice, patient chose [facility] referral made to [place] , she will get back with this NCM to let know if she can take referral.  Also referral made to [facility] with [employer] for rolling walker and bsc, he will bring up to patient room prior to discharge. Awaiting to hear back from [place] regarding [place]. Received call back from [place] stating that they can take referral but soc will be on next Wed.     Final next level of care: [place] Barriers to Discharge: No Barriers Identified   Patient Goals and CMS Choice Patient states their goals for this hospitalization and ongoing recovery are:: to go home CMS Medicare.gov Compare Post Acute Care list provided to:: Patient Choice offered to / list presented to : Patient  Discharge Placement                       Discharge Plan and Services                DME Arranged: Bedside commode, Walker rolling DME Agency: [employer] Date DME Agency Contacted: 09/12/18 Time DME Agency Contacted: [phone] Representative spoke with at DME Agency: zack HH Arranged: PT [place]: [facility] [facility] [employer] Date [place]: 09/12/18 Time [place]: [place] Representative spoke with at [place]: [place] ([place]) Interventions     Readmission Risk Interventions No flowsheet data found.

## 2018-09-12 NOTE — Discharge Summary (Signed)
Physician Discharge Summary  Cory BATY Sr. XBJ:[id number] DOB: [birth year] DOA: 09/10/2018  PCP: Shade Flood, MD  Admit date: 09/10/2018 Discharge date: 09/12/2018  Admitted From: home Disposition:  home  Recommendations for Outpatient Follow-up:  1. Follow up with PCP in 1-2 weeks 2. Please obtain BMP/CBC in one week 3. Follow up with urology Dr. Vernie Ammons in 2 weeks  Home Health: home health PT/OT Equipment/Devices: walker, bedside commode  Discharge Condition:stable CODE STATUS:full code Diet recommendation: heart healthy  Brief/Interim Summary: 63 year old male with a history of chronic back pain, seizures, brought to the hospital with altered mental status.  Found to have sepsis secondary to urinary tract infection.  Admitted for intravenous antibiotics and IV fluids.  Discharge Diagnoses:  Principal Problem:   UTI (urinary tract infection) Active Problems:   Sepsis (HCC)   Acute metabolic encephalopathy   Thrombocytopenia (HCC)   Total bilirubin, elevated  1. Sepsis secondary to urinary tract infection.  Patient was treated with IV hydration and intravenous antibiotics.  Urine culture returned positive for Citrobacter koseri.  This was also confirmed with his urologist who had performed a urine culture on the day prior to admission which grew the same bacteria.  Based on antibiotic sensitivity, he has been transitioned to oral Bactrim.  The patient is now afebrile. 2. Thrombocytopenia.  Secondary to sepsis.  This is improved. 3. Acute metabolic encephalopathy.  Secondary to sepsis.  Overall mental status has improved with treatment.  He is currently awake alert and appears to be coherent.  Vitamin B12 checked and noted to be in the lower range of normal.    He was started on B12 replacement 4. Generalized weakness.   Seen by physical therapy who recommended skilled nursing facility placement.  Patient is adamant about being discharged home.  His home situation was  reviewed with his wife who is a retired Engineer, civil (consulting).  She feels comfortable, with the help of her son to care for the patient at home.  Will set up home health physical therapy, Occupational Therapy and DME.  Discharge Instructions  Discharge Instructions    Diet - low sodium heart healthy   Complete by: As directed    Face-to-face encounter (required for Medicare/Medicaid patients)   Complete by: As directed    I Erick Blinks certify that this patient is under my care and that I, or a nurse practitioner or physician's assistant working with me, had a face-to-face encounter that meets the physician face-to-face encounter requirements with this patient on 09/12/2018. The encounter with the patient was in whole, or in part for the following medical condition(s) which is the primary reason for home health care (List medical condition): generalized weakness, UTI   The encounter with the patient was in whole, or in part, for the following medical condition, which is the primary reason for home health care: UTI, generalized weakness   I certify that, based on my findings, the following services are medically necessary home health services: Physical therapy   Reason for Medically Necessary Home Health Services: Therapy- Therapeutic Exercises to Increase Strength and Endurance   My clinical findings support the need for the above services: Unable to leave home safely without assistance and/or assistive device   Further, I certify that my clinical findings support that this patient is homebound due to: Unable to leave home safely without assistance   Home Health   Complete by: As directed    To provide the following care/treatments:  PT OT     Increase  activity slowly   Complete by: As directed      Allergies as of 09/12/2018   No Known Allergies     Medication List    TAKE these medications   HYDROcodone-acetaminophen 10-325 MG tablet Commonly known as: NORCO Take 1 tablet by mouth 5 (five) times  daily as needed for moderate pain.   sulfamethoxazole-trimethoprim 800-160 MG tablet Commonly known as: BACTRIM DS Take 1 tablet by mouth 2 (two) times daily.   vitamin B-12 500 MCG tablet Commonly known as: CYANOCOBALAMIN Take 1 tablet (500 mcg total) by mouth daily. Start taking on: September 13, 2018            Durable Medical Equipment  (From admission, onward)         Start     Ordered   09/12/18 1332  For home use only DME Walker rolling  Once    Question:  Patient needs a walker to treat with the following condition  Answer:  Generalized weakness   09/12/18 1331   09/12/18 1332  For home use only DME Bedside commode  Once    Question:  Patient needs a bedside commode to treat with the following condition  Answer:  Generalized weakness   09/12/18 1331         Follow-up Information    Ihor Gully, MD. Schedule an appointment as soon as possible for a visit in 2 week(s).   Specialty: Urology Contact information: [address] [place] [address] [phone]          No Known Allergies  Consultations:     Procedures/Studies: Ct Head Wo Contrast  Result Date: 09/10/2018 CLINICAL DATA:  Altered LOC EXAM: CT HEAD WITHOUT CONTRAST TECHNIQUE: Contiguous axial images were obtained from the base of the skull through the vertex without intravenous contrast. COMPARISON:  CT brain 01/23/2016 FINDINGS: Brain: Significant motion degradation. No gross mass or hemorrhage. Nonenlarged ventricles. Vascular: No unexpected calcification Skull: No fracture Sinuses/Orbits: Bony expansion of the anterior wall of right maxillary sinus with internal low-density. No acute fluid levels in the sinuses. Other: None IMPRESSION: 1. Motion degraded study. This limits evaluation of the brain. No gross acute intracranial abnormality. 2. Slightly expanded appearance of the anterior wall of right maxillary sinus with associated lucent areas, possibly an area of fibrous dysplasia or pagetoid  change. Electronically Signed   By: Jasmine Pang M.D.   On: 09/10/2018 20:56   Dg Chest Portable 1 View  Result Date: 09/10/2018 CLINICAL DATA:  63 year old male recently diagnosed with UTI. Confusion. EXAM: PORTABLE CHEST 1 VIEW COMPARISON:  09/24/2017 and earlier. FINDINGS: Portable AP semi upright view at 1813 hours. Larger lung volumes. Stable mildly tortuous thoracic aorta. Other mediastinal contours are within normal limits. Visualized tracheal air column is within normal limits. Allowing for portable technique the lungs are clear. No pneumothorax or pleural effusion. No acute osseous abnormality identified. IMPRESSION: No acute cardiopulmonary abnormality. Electronically Signed   By: Odessa Fleming M.D.   On: 09/10/2018 18:37       Subjective: Patient reports that he is feeling better. He is awake and alert. He wants to go home today. He is not interested in SNF placement  Discharge Exam: Vitals:   09/11/18 0954 09/11/18 1559 09/11/18 2044 09/12/18 0918  BP: 121/77 113/73 107/64 102/61  Pulse: 70 60 72 62  Resp: 19 (!) Temp: 98.6 F (37 C) 98.2 F (36.8 C) 97.7 F (36.5 C) 98.2 F (36.8 C)  TempSrc:  Oral Oral  Oral  SpO2: 100% 100% 99% 100%  Weight:   81.2 kg   Height:        General: Pt is alert, awake, not in acute distress Cardiovascular: RRR, S1/S2 +, no rubs, no gallops Respiratory: CTA bilaterally, no wheezing, no rhonchi Abdominal: Soft, NT, ND, bowel sounds + Extremities: no edema, no cyanosis    The results of significant diagnostics from this hospitalization (including imaging, microbiology, ancillary and laboratory) are listed below for reference.     Microbiology: Recent Results (from the past 240 hour(s))  SARS Coronavirus 2 (CEPHEID- Performed in [place] hospital lab), Hosp Order     Status: None   Collection Time: 09/10/18  5:44 PM   Specimen: Nasopharyngeal Swab  Result Value Ref Range Status   SARS Coronavirus 2 NEGATIVE NEGATIVE Final     Comment: (NOTE) If result is NEGATIVE SARS-CoV-2 target nucleic acids are NOT DETECTED. The SARS-CoV-2 RNA is generally detectable in upper and lower  respiratory specimens during the acute phase of infection. The lowest  concentration of SARS-CoV-2 viral copies this assay can detect is 250  copies / mL. A negative result does not preclude SARS-CoV-2 infection  and should not be used as the sole basis for treatment or other  patient management decisions.  A negative result may occur with  improper specimen collection / handling, submission of specimen other  than nasopharyngeal swab, presence of viral mutation(s) within the  areas targeted by this assay, and inadequate number of viral copies  (<250 copies / mL). A negative result must be combined with clinical  observations, patient history, and epidemiological information. If result is POSITIVE SARS-CoV-2 target nucleic acids are DETECTED. The SARS-CoV-2 RNA is generally detectable in upper and lower  respiratory specimens dur ing the acute phase of infection.  Positive  results are indicative of active infection with SARS-CoV-2.  Clinical  correlation with patient history and other diagnostic information is  necessary to determine patient infection status.  Positive results do  not rule out bacterial infection or co-infection with other viruses. If result is PRESUMPTIVE POSTIVE SARS-CoV-2 nucleic acids MAY BE PRESENT.   A presumptive positive result was obtained on the submitted specimen  and confirmed on repeat testing.  While 2019 novel coronavirus  (SARS-CoV-2) nucleic acids may be present in the submitted sample  additional confirmatory testing may be necessary for epidemiological  and / or clinical management purposes  to differentiate between  SARS-CoV-2 and other Sarbecovirus currently known to infect humans.  If clinically indicated additional testing with an alternate test  methodology [phone]) is advised. The SARS-CoV-2  RNA is generally  detectable in upper and lower respiratory sp ecimens during the acute  phase of infection. The expected result is Negative. Fact Sheet for Patients:  StrictlyIdeas.no Fact Sheet for Healthcare Providers: BankingDealers.co.za This test is not yet approved or cleared by the [place] FDA and has been authorized for detection and/or diagnosis of SARS-CoV-2 by FDA under an Emergency Use Authorization (EUA).  This EUA will remain in effect (meaning this test can be used) for the duration of the COVID-19 declaration under Section 564(b)(1) of the Act, 21 U.S.C. section 360bbb-3(b)(1), unless the authorization is terminated or revoked sooner. Performed at [facility], [address] [place], [place] [address]   Urine culture     Status: Abnormal   Collection Time: 09/10/18  7:58 PM   Specimen: Urine, Random  Result Value Ref Range Status   Specimen Description  URINE, RANDOM  Final   Special Requests   Final    NONE Performed at [facility], [address] [place], [place] [address]    Culture 10,000 COLONIES/mL CITROBACTER KOSERI (A)  Final   Report Status 09/12/2018 FINAL  Final   Organism ID, Bacteria CITROBACTER KOSERI (A)  Final      Susceptibility   Citrobacter koseri - MIC*    CEFAZOLIN <=4 SENSITIVE Sensitive     CEFTRIAXONE <=1 SENSITIVE Sensitive     CIPROFLOXACIN <=0.25 SENSITIVE Sensitive     GENTAMICIN <=1 SENSITIVE Sensitive     IMIPENEM <=0.25 SENSITIVE Sensitive     NITROFURANTOIN 32 SENSITIVE Sensitive     TRIMETH/SULFA <=20 SENSITIVE Sensitive     PIP/TAZO <=4 SENSITIVE Sensitive     * 10,000 COLONIES/mL CITROBACTER KOSERI  Blood Culture (routine x 2)     Status: None (Preliminary result)   Collection Time: 09/10/18  9:33 PM   Specimen: BLOOD  Result Value Ref Range Status   Specimen Description BLOOD LEFT ARM  Final   Special Requests   Final    BOTTLES DRAWN AEROBIC AND  ANAEROBIC Blood Culture adequate volume   Culture   Final    NO GROWTH 2 DAYS Performed at [facility], [address] [place], [place] [address]    Report Status PENDING  Incomplete  Blood Culture (routine x 2)     Status: None (Preliminary result)   Collection Time: 09/10/18  9:33 PM   Specimen: BLOOD  Result Value Ref Range Status   Specimen Description BLOOD RIGHT ARM  Final   Special Requests   Final    BOTTLES DRAWN AEROBIC ONLY Blood Culture results may not be optimal due to an excessive volume of blood received in culture bottles   Culture   Final    NO GROWTH 2 DAYS Performed at [facility], [address] [place], [place] [address]    Report Status PENDING  Incomplete     Labs: BNP (last 3 results) No results for input(s): BNP in the last 8760 hours. Basic Metabolic Panel: Recent Labs  Lab 09/10/18 1922 09/12/18 0456  NA 137 139  K 5.1 3.3*  CL 104 103  CO2 22 28  GLUCOSE 94 122*  BUN 13 13  CREATININE 0.98 0.75  CALCIUM 9.0 8.9   Liver Function Tests: Recent Labs  Lab 09/10/18 1922 09/11/18 0525  AST 19 11*  ALT 6 7  ALKPHOS 70 57  BILITOT 1.5* 1.2  PROT 6.8 5.9*  ALBUMIN 3.4* 2.9*   No results for input(s): LIPASE, AMYLASE in the last 168 hours. Recent Labs  Lab 09/11/18 0000  AMMONIA 12   CBC: Recent Labs  Lab 09/10/18 1922 09/11/18 0525 09/12/18 0456  WBC 12.5* 11.4* 10.7*  NEUTROABS 10.1*  --   --   HGB 15.3 12.7* 13.3  HCT 49.6 39.0 40.7  MCV 95.8 90.3 89.5  PLT 73* 145* 148*   Cardiac Enzymes: No results for input(s): CKTOTAL, CKMB, CKMBINDEX, TROPONINI in the last 168 hours. BNP: Invalid input(s): POCBNP CBG: No results for input(s): GLUCAP in the last 168 hours. D-Dimer No results for input(s): DDIMER in the last 72 hours. Hgb A1c No results for input(s): HGBA1C in the last 72 hours. Lipid Profile No results for input(s): CHOL, HDL, LDLCALC, TRIG, CHOLHDL, LDLDIRECT in the last 72 hours. Thyroid  function studies Recent Labs    09/11/18 0000  TSH 0.268*   Anemia  work up [facility]    09/11/18 0000  VITAMINB12 350   Urinalysis    Component Value Date/Time   COLORURINE AMBER (A) 09/10/2018 1641   APPEARANCEUR CLOUDY (A) 09/10/2018 1641   LABSPEC 1.025 09/10/2018 1641   PHURINE 6.5 09/10/2018 1641   GLUCOSEU 250 (A) 09/10/2018 1641   HGBUR LARGE (A) 09/10/2018 1641   BILIRUBINUR MODERATE (A) 09/10/2018 1641   BILIRUBINUR negative 10/13/2017 1155   BILIRUBINUR NEG 12/04/2013 1458   KETONESUR 15 (A) 09/10/2018 1641   PROTEINUR >300 (A) 09/10/2018 1641   UROBILINOGEN 0.2 10/13/2017 1155   NITRITE POSITIVE (A) 09/10/2018 1641   LEUKOCYTESUR MODERATE (A) 09/10/2018 1641   Sepsis Labs Invalid input(s): PROCALCITONIN,  WBC,  LACTICIDVEN Microbiology Recent Results (from the past 240 hour(s))  SARS Coronavirus 2 (CEPHEID- Performed in [facility] lab), Hosp Order     Status: None   Collection Time: 09/10/18  5:44 PM   Specimen: Nasopharyngeal Swab  Result Value Ref Range Status   SARS Coronavirus 2 NEGATIVE NEGATIVE Final    Comment: (NOTE) If result is NEGATIVE SARS-CoV-2 target nucleic acids are NOT DETECTED. The SARS-CoV-2 RNA is generally detectable in upper and lower  respiratory specimens during the acute phase of infection. The lowest  concentration of SARS-CoV-2 viral copies this assay can detect is 250  copies / mL. A negative result does not preclude SARS-CoV-2 infection  and should not be used as the sole basis for treatment or other  patient management decisions.  A negative result may occur with  improper specimen collection / handling, submission of specimen other  than nasopharyngeal swab, presence of viral mutation(s) within the  areas targeted by this assay, and inadequate number of viral copies  (<250 copies / mL). A negative result must be combined with clinical  observations, patient history, and epidemiological information. If result is  POSITIVE SARS-CoV-2 target nucleic acids are DETECTED. The SARS-CoV-2 RNA is generally detectable in upper and lower  respiratory specimens dur ing the acute phase of infection.  Positive  results are indicative of active infection with SARS-CoV-2.  Clinical  correlation with patient history and other diagnostic information is  necessary to determine patient infection status.  Positive results do  not rule out bacterial infection or co-infection with other viruses. If result is PRESUMPTIVE POSTIVE SARS-CoV-2 nucleic acids MAY BE PRESENT.   A presumptive positive result was obtained on the submitted specimen  and confirmed on repeat testing.  While 2019 novel coronavirus  (SARS-CoV-2) nucleic acids may be present in the submitted sample  additional confirmatory testing may be necessary for epidemiological  and / or clinical management purposes  to differentiate between  SARS-CoV-2 and other Sarbecovirus currently known to infect humans.  If clinically indicated additional testing with an alternate test  methodology [phone] is advised. The SARS-CoV-2 RNA is generally  detectable in upper and lower respiratory sp ecimens during the acute  phase of infection. The expected result is Negative. Fact Sheet for Patients:  BoilerBrush.com.cyhttps://www.fda.gov/media/136312/download Fact Sheet for Healthcare Providers: https://pope.com/https://www.fda.gov/media/136313/download This test is not yet approved or cleared by the [place] FDA and has been authorized for detection and/or diagnosis of SARS-CoV-2 by FDA under an Emergency Use Authorization (EUA).  This EUA will remain in effect (meaning this test can be used) for the duration of the COVID-19 declaration under Section 564(b)(1) of the Act, 21 U.S.C. section 360bbb-3(b)(1), unless the authorization is terminated or revoked sooner. Performed at [facility], [address]  [place], [place] [address]   Urine culture     Status: Abnormal   Collection Time:  09/10/18  7:58 PM   Specimen: Urine, Random  Result Value Ref Range Status   Specimen Description URINE, RANDOM  Final   Special Requests   Final    NONE Performed at [facility], [address] [place], [place] [address]    Culture 10,000 COLONIES/mL CITROBACTER KOSERI (A)  Final   Report Status 09/12/2018 FINAL  Final   Organism ID, Bacteria CITROBACTER KOSERI (A)  Final      Susceptibility   Citrobacter koseri - MIC*    CEFAZOLIN <=4 SENSITIVE Sensitive     CEFTRIAXONE <=1 SENSITIVE Sensitive     CIPROFLOXACIN <=0.25 SENSITIVE Sensitive     GENTAMICIN <=1 SENSITIVE Sensitive     IMIPENEM <=0.25 SENSITIVE Sensitive     NITROFURANTOIN 32 SENSITIVE Sensitive     TRIMETH/SULFA <=20 SENSITIVE Sensitive     PIP/TAZO <=4 SENSITIVE Sensitive     * 10,000 COLONIES/mL CITROBACTER KOSERI  Blood Culture (routine x 2)     Status: None (Preliminary result)   Collection Time: 09/10/18  9:33 PM   Specimen: BLOOD  Result Value Ref Range Status   Specimen Description BLOOD LEFT ARM  Final   Special Requests   Final    BOTTLES DRAWN AEROBIC AND ANAEROBIC Blood Culture adequate volume   Culture   Final    NO GROWTH 2 DAYS Performed at [facility], [address] [place], [place] [address]    Report Status PENDING  Incomplete  Blood Culture (routine x 2)     Status: None (Preliminary result)   Collection Time: 09/10/18  9:33 PM   Specimen: BLOOD  Result Value Ref Range Status   Specimen Description BLOOD RIGHT ARM  Final   Special Requests   Final    BOTTLES DRAWN AEROBIC ONLY Blood Culture results may not be optimal due to an excessive volume of blood received in culture bottles   Culture   Final    NO GROWTH 2 DAYS Performed at [facility], [address] [place], [place] [address]    Report Status PENDING  Incomplete     Time coordinating discharge: 35mins  SIGNED:   Erick BlinksJehanzeb Memon, MD  [facility] 09/12/2018, 1:35 PM   If 7PM-7AM, please  contact night-coverage [email or web address]

## 2018-09-12 NOTE — Plan of Care (Signed)
  Problem: Activity: Goal: Risk for activity intolerance will decrease Outcome: Progressing   Problem: Nutrition: Goal: Adequate nutrition will be maintained Outcome: Progressing   

## 2018-09-12 NOTE — Progress Notes (Signed)
Physical Therapy Treatment Patient Details Name: Cory Johnson Kindley Sr. MRN: [id number] DOB: Aug 30, 1955 Today's Date: 09/12/2018    History of Present Illness Cory Johnson Harrigan Sr. is a 63 y.o. male with medical history significant of VTE, seizures, chronic back pain, arthritis, essential tremor presenting to the hospital for altered mental status.  Patient is AAO x3 but not sure why he is here    PT Comments    Discussed with RN, patient to go home today. Patient with slow mobility and processing, replying appropriately at times but unclear historian (stated he has a RW at home  but per RN/Wife he does not). RN informs me that wife is retired Engineer, civil (consulting)nurse and is capable of 24/7 assist, which he will need as he currently requires hands on assistance standing. If unsupervised, patient is high fall risk at this time. Able to ambulate short distances with guarding and use of gait belt. rec RW and BSC.     Follow Up Recommendations  SNF(may be able to progress to HHPT)     Equipment Recommendations  Rolling Walker,  [facility]   Recommendations for Other Services OT consult     Precautions / Restrictions Precautions Precautions: Fall Restrictions Weight Bearing Restrictions: No    Mobility  Bed Mobility Overal bed mobility: Modified Independent                Transfers Overall transfer level: Needs assistance Equipment used: Rolling walker (2 wheeled) Transfers: Sit to/from Stand Sit to Stand: Min assist;Min guard         General transfer comment: 2x LOB upon standing posterior back onto bed. poor use of RW and hand placement. cues throughout  Ambulation/Gait Ambulation/Gait assistance: Min assist   Assistive device: Rolling walker (2 wheeled) Gait Pattern/deviations: Step-to pattern Gait velocity: decreased   General Gait Details: 10' to the bathroom and back, extensive time in bathroom due to slow movement/processing.    Stairs             Wheelchair Mobility     Modified Rankin (Stroke Patients Only)       Balance Overall balance assessment: Needs assistance   Sitting balance-Leahy Scale: Fair       Standing balance-Leahy Scale: Poor                              Cognition Arousal/Alertness: Awake/alert Behavior During Therapy: WFL for tasks assessed/performed;Flat affect Overall Cognitive Status: No family/caregiver present to determine baseline cognitive functioning                                 General Comments: AOx3 slow processing and at times a bit impulsive       Exercises      General Comments        Pertinent Vitals/Pain      Home Living                      Prior Function            PT Goals (current goals can now be found in the care plan section) Acute Rehab PT Goals Patient Stated Goal: go home PT Goal Formulation: With patient Time For Goal Achievement: 09/25/18 Potential to Achieve Goals: Good    Frequency    Min 3X/week      PT Plan      Co-evaluation  AM-PAC PT "6 Clicks" Mobility   Outcome Measure  Help needed turning from your back to your side while in a flat bed without using bedrails?: A Little Help needed moving from lying on your back to sitting on the side of a flat bed without using bedrails?: A Little Help needed moving to and from a bed to a chair (including a wheelchair)?: A Lot Help needed standing up from a chair using your arms (e.g., wheelchair or bedside chair)?: A Lot Help needed to walk in hospital room?: A Lot Help needed climbing 3-5 steps with a railing? : Total 6 Click Score: 13    End of Session Equipment Utilized During Treatment: Gait belt Activity Tolerance: Patient tolerated treatment well Patient left: in bed;with call bell/phone within reach;with bed alarm set Nurse Communication: Mobility status PT Visit Diagnosis: Unsteadiness on feet (R26.81)     Time: 7416-3845 PT Time Calculation (min)  (ACUTE ONLY): 23 min  Charges:  $Gait Training: 8-22 mins $Therapeutic Activity: 8-22 mins                     Reinaldo Berber, PT, DPT Acute Rehabilitation Services Pager: [phone] Office: [place] 09/12/2018, 1:32 PM

## 2018-09-15 LAB — CULTURE, BLOOD (ROUTINE X 2)
Culture: NO GROWTH
Culture: NO GROWTH
Special Requests: ADEQUATE

## 2018-09-17 ENCOUNTER — Telehealth: Payer: Self-pay | Admitting: Family Medicine

## 2018-09-17 NOTE — Telephone Encounter (Signed)
Pt's spouse called in to be advised. Spouse says that pt was admitted to the ED for blood in his stool. Spouse says that they were unable to get pt's results from the hospital and would like to know if provider could assist them with results as well as advise further? Spouse says that she has noticed that not only is pt still having blood in stool since discharge but pt has also been losing weight.    CB: [phone] - Mrs. Olver

## 2018-09-17 NOTE — Telephone Encounter (Signed)
Pt has been scheduled with Dr. Carlota Raspberry 09/19/2018

## 2018-09-17 NOTE — Telephone Encounter (Signed)
Called and LVM for pt to call office about concerns for her husband.

## 2018-09-18 DIAGNOSIS — G20 Parkinson's disease: Secondary | ICD-10-CM | POA: Diagnosis not present

## 2018-09-19 ENCOUNTER — Other Ambulatory Visit: Payer: Self-pay

## 2018-09-19 ENCOUNTER — Telehealth (INDEPENDENT_AMBULATORY_CARE_PROVIDER_SITE_OTHER): Payer: Medicare Other | Admitting: Family Medicine

## 2018-09-19 DIAGNOSIS — F0390 Unspecified dementia without behavioral disturbance: Secondary | ICD-10-CM | POA: Diagnosis not present

## 2018-09-19 DIAGNOSIS — R195 Other fecal abnormalities: Secondary | ICD-10-CM | POA: Diagnosis not present

## 2018-09-19 DIAGNOSIS — A419 Sepsis, unspecified organism: Secondary | ICD-10-CM

## 2018-09-19 DIAGNOSIS — R531 Weakness: Secondary | ICD-10-CM

## 2018-09-19 DIAGNOSIS — N390 Urinary tract infection, site not specified: Secondary | ICD-10-CM

## 2018-09-19 NOTE — Progress Notes (Signed)
Virtual Visit via Telephone Note  I connected with Cory CoasterKenneth B Coplen on 09/19/18 at 5:33 PM by telephone and verified that I am speaking with the correct person using two identifiers. Speaking to patient and spouse Cory SpurrWalissa.    I discussed the limitations, risks, security and privacy concerns of performing an evaluation and management service by telephone and the availability of in person appointments. I also discussed with the patient that there may be a patient responsible charge related to this service. The patient expressed understanding and agreed to proceed, consent obtained.   Chief complaint: Weight loss:  History of Present Illness: Cory DressKenneth B Cloer is a 63 y.o. male  Concerns regarding recent CT scan, weight loss, recent lab tests.   Recently admitted at [name] from June 30 through July 2 for altered mental status.  Found to have sepsis secondary to urinary tract infection.  Treated with IV hydration and IV antibiotics.  Urine culture positive for Citrobacter which was confirmed on culture by urologist day prior to admission.  Was transitioned to oral Bactrim.  Thrombocytopenia secondary to sepsis improved.  B12 replacement started as B12 level on the lower range of normal.  Mental status improved/acute metabolic encephalopathy improved secondary to sepsis.  Generalized weakness, evaluated by physical therapy and recommended nursing facility placement.  He was adamant about being discharged home, and discussed with his wife.  Plan for home health PT, OT, and durable medical equipment.  Blood cultures negative.  Home PT plans to be out there tomorrow - unknown time.   Spouse complains of black/tar colored runny stool for past week. 3-4 times during the day and again at night. 5 times total per day.  No fever. No change in mental status since ER. Doing better in that regard.  No abd pain.  Finished antibiotics 2 days ago.  No history of PUD. No BRBPR.  No bismuth or iron  supplements.  No new lightheadedness/dizziness, no chest pains or shortness of breath.  Decreased appetite since hospital. No abd pain.   Evaluated yesterday [facility], Dr. Rubin PayorSiddiqui.    Diagnoses of Lewy body dementia with behavioral disturbance, Parkinson's disease, possible paralysis agitans.  Other specifics of visit are not available at this time. started on Sinemet 25/100.  He has been evaluated Dr. [name] locally in 2019, trial of Sinemet in the past which did not help symptoms, treated with Inderal for tremor.  Referred to [facility] neuro at that time.   CT head obtained June 30 for altered LOC - motion degraded study but no gross acute intracranial abnormality.  Slight expanded appearance of the anterior wall of the right maxillary sinus with associated lucent areas possibly fibrous dysplasia versus pagetoid change. Has some concerns about how CT scan was obtained. Will discuss further in office.      Patient Active Problem List   Diagnosis Date Noted  . UTI (urinary tract infection) 09/10/2018  . Sepsis (HCC) 09/10/2018  . Acute metabolic encephalopathy 09/10/2018  . Thrombocytopenia (HCC) 09/10/2018  . Total bilirubin, elevated 09/10/2018  . 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 REM sleep behavior disorder 09/17/2017  . Tremor 05/08/2017  . Gait abnormality 05/08/2017  . Transient hypotension 05/05/2017  . Malnutrition of moderate degree 03/28/2017  . Syncope and collapse 03/26/2017  . Subungual hematoma of great toe of right foot 12/13/2016  . Pain of toe of right foot 12/13/2016  . Paresthesia 06/29/2016  . Tremor of right  hand 06/29/2016  . Mild cognitive impairment 06/29/2016  . Tremors of nervous system 06/22/2016  . Drooling 06/22/2016  . Loss of weight 06/22/2016  . Family history of throat cancer 06/22/2016  . Abnormality of gait 06/09/2016  . Acute bronchitis 03/20/2016  . Memory loss 12/31/2013   . Back pain 08/18/2013  . Arthritis   . Myoclonic jerking   . Right inguinal hernia 03/17/2011  . Umbilical hernia 03/17/2011  . Acute pulmonary embolus (HCC) 11/07/2009  . DVT of lower extremity (deep venous thrombosis) (HCC) 11/07/2009   Past Medical History:  Diagnosis Date  . Acute pulmonary embolus (HCC) 11/07/2009   pt denies  . Arthritis    neck - limited range of motion; back, shoulders  . Chronic back pain   . Difficult intubation   . DVT of lower extremity (deep venous thrombosis) (HCC) 11/07/2009  . Occasional tremors 03/2017   HANDS   . Right inguinal hernia 02/2011  . Seizures (HCC)    pt denies  . Syncope and collapse 03/26/2017  . Umbilical hernia 02/2011   Past Surgical History:  Procedure Laterality Date  . CLOSED REDUCTION FINGER WITH PERCUTANEOUS PINNING Left 06/26/2018   Procedure: CLOSED REDUCTION FINGER WITH PERCUTANEOUS PINNING;  Surgeon: Ernest Mallickreighton, James J III, MD;  Location: [facility];  Service: Orthopedics;  Laterality: Left;  . HERNIA REPAIR    . I&D EXTREMITY Left 06/26/2018   Procedure: Left Index finger wound exploration, irrigation and debridement, fracture fixation, flexor tendon repair, digital nerve repair with conduit as needed and surgery as indicated;  Surgeon: Ernest Mallickreighton, James J III, MD;  Location: [facility];  Service: Orthopedics;  Laterality: Left;  90min  . INGUINAL HERNIA REPAIR  03/17/2011   Procedure: HERNIA REPAIR INGUINAL ADULT;  Surgeon: Ernestene MentionHaywood M Ingram, MD;  Location: [facility];  Service: General;  Laterality: Right;  right inguinal hernia repair with mesh  . KNEE SURGERY  1977   left  . NERVE AND TENDON REPAIR Left 06/26/2018   Procedure: NERVE AND TENDON REPAIR;  Surgeon: Ernest Mallickreighton, James J III, MD;  Location: [facility];  Service: Orthopedics;  Laterality: Left;  . UMBILICAL HERNIA REPAIR  03/17/2011   Procedure: HERNIA REPAIR UMBILICAL ADULT;  Surgeon: Ernestene MentionHaywood M  Ingram, MD;  Location: [facility];  Service: General;  Laterality: N/A;  umbilical hernia repair with mesh   No Known Allergies Prior to Admission medications   Medication Sig Start Date End Date Taking? Authorizing Provider  Carbidopa-Levodopa ER (SINEMET CR) 25-100 MG tablet controlled release Take by mouth. 09/18/18 10/02/19 Yes [provider]  HYDROcodone-acetaminophen (NORCO) 10-325 MG tablet Take 1 tablet by mouth 5 (five) times daily as needed for moderate pain. 08/02/18  Yes [provider]  vitamin B-12 (CYANOCOBALAMIN) 500 MCG tablet Take 1 tablet (500 mcg total) by mouth daily. 09/13/18  Yes Erick BlinksMemon, Jehanzeb, MD  sulfamethoxazole-trimethoprim (BACTRIM DS) 800-160 MG tablet Take 1 tablet by mouth 2 (two) times daily. Patient not taking: Reported on 09/19/2018 09/12/18   Erick BlinksMemon, Jehanzeb, MD   Social History   Socioeconomic History  . Marital status: Married    Spouse name: Rayburn GoWallissa  . Number of children: 5  . Years of education: college  . Highest education level: Not on file  Occupational History  . Occupation: DIRECTOR    Employer: [employer]  Social Needs  . Financial resource strain: Not on file  . Food insecurity    Worry: Not on  file    Inability: Not on file  . Transportation needs    Medical: Not on file    Non-medical: Not on file  Tobacco Use  . Smoking status: Never Smoker  . Smokeless tobacco: Never Used  Substance and Sexual Activity  . Alcohol use: No  . Drug use: No  . Sexual activity: Yes    Birth control/protection: Surgical  Lifestyle  . Physical activity    Days per week: Not on file    Minutes per session: Not on file  . Stress: Not on file  Relationships  . Social Herbalist on phone: Not on file    Gets together: Not on file    Attends religious service: Not on file    Active member of club or organization: Not on file    Attends meetings of clubs or organizations: Not on file     Relationship status: Not on file  . Intimate partner violence    Fear of current or ex partner: Not on file    Emotionally abused: Not on file    Physically abused: Not on file    Forced sexual activity: Not on file  Other Topics Concern  . Not on file  Social History Narrative   Patient lives at home with his wife [name]).   Disabled.   Education college.   Right handed.   Caffeine one cup of coffee daily.     Observations/Objective: Appropriate responses on phone.  Spoke with both patient and spouse.  All questions answered, understanding expressed of plan  Assessment and Plan: Dementia without behavioral disturbance, unspecified dementia type ([place]) - Plan:  General weakness - Plan:  Sepsis secondary to UTI ([place]) - Plan:  Loose stools - Plan:  Dark stools - Plan:   In office eval recommend in 1 day - overnight ER precautions given.   Follow Up Instructions:    I discussed the assessment and treatment plan with the patient. The patient was provided an opportunity to ask questions and all were answered. The patient agreed with the plan and demonstrated an understanding of the instructions.   The patient was advised to call back or seek an in-person evaluation if the symptoms worsen or if the condition fails to improve as anticipated.  I provided 23 minutes of non-face-to-face time during this encounter.  Signed,   [name], MD Primary Care at [place] [facility].  09/19/18

## 2018-09-19 NOTE — Progress Notes (Signed)
CC: discuss lab results, CT scan and weight loss per spouse.  No recent weight or bp taken.  No travel outside [place] or [place] in the past 3 weeks.

## 2018-09-19 NOTE — Patient Instructions (Signed)
° ° ° °  If you have lab work done today you will be contacted with your lab results within the next 2 weeks.  If you have not heard from us then please contact us. The fastest way to get your results is to register for My Chart. ° ° °IF you received an x-ray today, you will receive an invoice from [facility]. Please contact [facility] at [phone] with questions or concerns regarding your invoice.  ° °IF you received labwork today, you will receive an invoice from [facility]. Please contact [facility] at [phone] with questions or concerns regarding your invoice.  ° °Our billing staff will not be able to assist you with questions regarding bills from these companies. ° °You will be contacted with the lab results as soon as they are available. The fastest way to get your results is to activate your My Chart account. Instructions are located on the last page of this paperwork. If you have not heard from us regarding the results in 2 weeks, please contact this office. °  ° ° ° °

## 2018-09-20 ENCOUNTER — Encounter (HOSPITAL_COMMUNITY): Payer: Self-pay

## 2018-09-20 ENCOUNTER — Ambulatory Visit (INDEPENDENT_AMBULATORY_CARE_PROVIDER_SITE_OTHER): Payer: Medicare Other | Admitting: Family Medicine

## 2018-09-20 ENCOUNTER — Encounter: Payer: Self-pay | Admitting: Family Medicine

## 2018-09-20 ENCOUNTER — Other Ambulatory Visit: Payer: Self-pay

## 2018-09-20 ENCOUNTER — Emergency Department (HOSPITAL_COMMUNITY): Payer: Medicare Other

## 2018-09-20 ENCOUNTER — Emergency Department (HOSPITAL_COMMUNITY)
Admission: EM | Admit: 2018-09-20 | Discharge: 2018-09-20 | Disposition: A | Discharge status: Home / Self Care | Payer: Medicare Other | Attending: Emergency Medicine | Admitting: Emergency Medicine

## 2018-09-20 VITALS — BP 112/72 | HR 78 | Temp 98.2°F | Resp 18 | Ht 74.0 in | Wt 170.4 lb

## 2018-09-20 DIAGNOSIS — Z5321 Procedure and treatment not carried out due to patient leaving prior to being seen by health care provider: Secondary | ICD-10-CM | POA: Diagnosis not present

## 2018-09-20 DIAGNOSIS — R5383 Other fatigue: Secondary | ICD-10-CM

## 2018-09-20 DIAGNOSIS — I499 Cardiac arrhythmia, unspecified: Secondary | ICD-10-CM | POA: Diagnosis not present

## 2018-09-20 DIAGNOSIS — R634 Abnormal weight loss: Secondary | ICD-10-CM

## 2018-09-20 DIAGNOSIS — N390 Urinary tract infection, site not specified: Secondary | ICD-10-CM

## 2018-09-20 DIAGNOSIS — R630 Anorexia: Secondary | ICD-10-CM | POA: Diagnosis not present

## 2018-09-20 DIAGNOSIS — I4949 Other premature depolarization: Secondary | ICD-10-CM | POA: Diagnosis not present

## 2018-09-20 DIAGNOSIS — A419 Sepsis, unspecified organism: Secondary | ICD-10-CM | POA: Diagnosis not present

## 2018-09-20 DIAGNOSIS — E8809 Other disorders of plasma-protein metabolism, not elsewhere classified: Secondary | ICD-10-CM

## 2018-09-20 DIAGNOSIS — R009 Unspecified abnormalities of heart beat: Secondary | ICD-10-CM | POA: Diagnosis present

## 2018-09-20 DIAGNOSIS — E876 Hypokalemia: Secondary | ICD-10-CM | POA: Diagnosis not present

## 2018-09-20 DIAGNOSIS — R195 Other fecal abnormalities: Secondary | ICD-10-CM | POA: Diagnosis not present

## 2018-09-20 LAB — BASIC METABOLIC PANEL
Anion gap: 9 (ref 5–15)
BUN: 6 mg/dL — ABNORMAL LOW (ref 8–23)
CO2: 28 mmol/L (ref 22–32)
Calcium: 9 mg/dL (ref 8.9–10.3)
Chloride: 101 mmol/L (ref 98–111)
Creatinine, Ser: 0.95 mg/dL (ref 0.61–1.24)
GFR calc Af Amer: >60 mL/min (ref >60)
GFR calc non Af Amer: >60 mL/min (ref >60)
Glucose, Bld: 126 mg/dL — ABNORMAL HIGH (ref 70–99)
Potassium: 3.6 mmol/L (ref 3.5–5.1)
Sodium: 138 mmol/L (ref 135–145)

## 2018-09-20 LAB — CBC
HCT: 40.2 % (ref 39.0–52.0)
Hemoglobin: 12.9 g/dL — ABNORMAL LOW (ref 13.0–17.0)
MCH: 29.5 pg (ref 26.0–34.0)
MCHC: 32.1 g/dL (ref 30.0–36.0)
MCV: 91.8 fL (ref 80.0–100.0)
Platelets: 249 10*3/uL (ref 150–400)
RBC: 4.38 MIL/uL (ref 4.22–5.81)
RDW: 12.9 % (ref 11.5–15.5)
WBC: 6.5 10*3/uL (ref 4.0–10.5)
nRBC: 0 % (ref 0.0–0.2)

## 2018-09-20 LAB — POCT CBC
Granulocyte percent: 78.5 %G (ref 37–80)
HCT, POC: 40.7 % (ref 29–41)
Hemoglobin: 13.6 g/dL (ref 11–14.6)
Lymph, poc: 0.8 (ref 0.6–3.4)
MCH, POC: 29 pg (ref 27–31.2)
MCHC: 33.3 g/dL (ref 31.8–35.4)
MCV: 87.1 fL (ref 76–111)
MID (cbc): 0.3 (ref 0–0.9)
MPV: 5.9 fL (ref 0–99.8)
POC Granulocyte: 4 (ref 2–6.9)
POC LYMPH PERCENT: 16.4 %L (ref 10–50)
POC MID %: 5.1 %M (ref 0–12)
Platelet Count, POC: 291 10*3/uL (ref 142–424)
RBC: 4.68 M/uL — AB (ref 4.69–6.13)
RDW, POC: 13.8 %
WBC: 5.1 10*3/uL (ref 4.6–10.2)

## 2018-09-20 LAB — IFOBT (OCCULT BLOOD): IFOBT: NEGATIVE

## 2018-09-20 MED ORDER — SODIUM CHLORIDE 0.9% FLUSH: 3.0000 mL | Freq: Once | INTRAVENOUS | Status: DC

## 2018-09-20 NOTE — ED Triage Notes (Signed)
Pt sent here from PCP for irregular heart beat, no hx of same. Pt denies any complaints at this time. Pt alert

## 2018-09-20 NOTE — ED Notes (Signed)
Pt asking about the wait times. Pt informed he is currently the longest wait and would be going to a room soon. Pt reports he would like to leave. Pt encouraged to stay as he would be going back to a room soon. Pt decided to leave.

## 2018-09-20 NOTE — Patient Instructions (Signed)
With abnormal EKG and recent loose stools, decreased intake and low potassium in hospital, I recommend further evaluation in Emergency Room today. Please proceed there after leaving our office.

## 2018-09-20 NOTE — Progress Notes (Signed)
Subjective:    Patient ID: Cory Johnson, male    DOB: [birth year], 63 y.o.   MRN: [id number]  HPI Cory Johnson is a 63 y.o. male Presents today for: Chief Complaint  Patient presents with  . Stool Color Change    black tarry stools since 07/29, no other sx    Dark stools.  See telemedicine visit yesterday.  Has noticed 4-5 [place] stools per day over the past week or so since leaving hospital.  Has completed antibiotics for urinary tract infection causing sepsis/altered mental status during the hospitalization.  No chest pain, lightheadedness, or dizziness.  Some fatigue but has also had decreased appetite since leaving hospital.  No known history of peptic ulcer disease.  Reports stool was less dark this morning.  No fevers, no abdominal pain.  Hypokalemia Potassium 3.3 on July 2 labs. Repeat planned today.  PVC in prior EKG in hospital.  Denies chest pain/palpitations, lightheadedness or dizziness.   Hypo-albuminemia: Low at 2.9, total protein 5.9 on July 1 labs.  Decreased appetite since leaving hospital.  Wt Readings from Last 3 Encounters:  09/20/18 170 lb 6.4 oz (77.3 kg)  09/11/18 179 lb 0.2 oz (81.2 kg)  06/26/18 184 lb 1.4 oz (83.5 kg)  Has had some weight loss, currently under care of neurologist at [facility] for dementia (mood Dementia, parkinsonism noted on diagnoses from yesterday).  Recently restarted on Sinemet CR.    Patient Active Problem List   Diagnosis Date Noted  . UTI (urinary tract infection) 09/10/2018  . Sepsis ([place]) 09/10/2018  . Acute metabolic encephalopathy 71/69/6789  . Thrombocytopenia ([place]) 09/10/2018  . Total bilirubin, elevated 09/10/2018  . Cerebellar ataxia in diseases classified elsewhere ([place]) 09/17/2017  . REM sleep behavior disorder 09/17/2017  . Tremor 05/08/2017  . Gait abnormality 05/08/2017  . Transient hypotension 05/05/2017  . Malnutrition of moderate degree 03/28/2017  . Syncope and collapse  03/26/2017  . Subungual hematoma of great toe of right foot 12/13/2016  . Pain of toe of right foot 12/13/2016  . Paresthesia 06/29/2016  . Tremor of right hand 06/29/2016  . Mild cognitive impairment 06/29/2016  . Tremors of nervous system 06/22/2016  . Drooling 06/22/2016  . Loss of weight 06/22/2016  . Family history of throat cancer 06/22/2016  . Abnormality of gait 06/09/2016  . Acute bronchitis 03/20/2016  . Memory loss 12/31/2013  . Back pain 08/18/2013  . Arthritis   . Myoclonic jerking   . Right inguinal hernia 03/17/2011  . Umbilical hernia 38/12/1749  . Acute pulmonary embolus ([place]) 11/07/2009  . DVT of lower extremity (deep venous thrombosis) ([place]) 11/07/2009   Past Medical History:  Diagnosis Date  . Acute pulmonary embolus ([place]) 11/07/2009   pt denies  . Arthritis    neck - limited range of motion; back, shoulders  . Chronic back pain   . Difficult intubation   . DVT of lower extremity (deep venous thrombosis) ([place]) 11/07/2009  . Occasional tremors 03/2017   HANDS   . Right inguinal hernia 02/2011  . Seizures ([place])    pt denies  . Syncope and collapse 03/26/2017  . Umbilical hernia 04/5850   Past Surgical History:  Procedure Laterality Date  . CLOSED REDUCTION FINGER WITH PERCUTANEOUS PINNING Left 06/26/2018   Procedure: CLOSED REDUCTION FINGER WITH PERCUTANEOUS PINNING;  Surgeon: Verner Mould, MD;  Location: [facility];  Service: Orthopedics;  Laterality: Left;  . HERNIA REPAIR    . I&D EXTREMITY  Left 06/26/2018   Procedure: Left Index finger wound exploration, irrigation and debridement, fracture fixation, flexor tendon repair, digital nerve repair with conduit as needed and surgery as indicated;  Surgeon: Ernest Mallickreighton, James J III, MD;  Location: [facility];  Service: Orthopedics;  Laterality: Left;  90min  . INGUINAL HERNIA REPAIR  03/17/2011   Procedure: HERNIA REPAIR INGUINAL ADULT;  Surgeon: Ernestene MentionHaywood M Ingram, MD;   Location: [facility];  Service: General;  Laterality: Right;  right inguinal hernia repair with mesh  . KNEE SURGERY  1977   left  . NERVE AND TENDON REPAIR Left 06/26/2018   Procedure: NERVE AND TENDON REPAIR;  Surgeon: Ernest Mallickreighton, James J III, MD;  Location: [facility];  Service: Orthopedics;  Laterality: Left;  . UMBILICAL HERNIA REPAIR  03/17/2011   Procedure: HERNIA REPAIR UMBILICAL ADULT;  Surgeon: Ernestene MentionHaywood M Ingram, MD;  Location:  [facility];  Service: General;  Laterality: N/A;  umbilical hernia repair with mesh   No Known Allergies Prior to Admission medications   Medication Sig Start Date End Date Taking? Authorizing Provider  Carbidopa-Levodopa ER (SINEMET CR) 25-100 MG tablet controlled release Take by mouth. 09/18/18 10/02/19 Yes [provider]  HYDROcodone-acetaminophen (NORCO) 10-325 MG tablet Take 1 tablet by mouth 5 (five) times daily as needed for moderate pain. 08/02/18  Yes [provider]  vitamin B-12 (CYANOCOBALAMIN) 500 MCG tablet Take 1 tablet (500 mcg total) by mouth daily. 09/13/18  Yes Erick BlinksMemon, Jehanzeb, MD  sulfamethoxazole-trimethoprim (BACTRIM DS) 800-160 MG tablet Take 1 tablet by mouth 2 (two) times daily. Patient not taking: Reported on 09/19/2018 09/12/18   Erick BlinksMemon, Jehanzeb, MD   Social History   Socioeconomic History  . Marital status: Married    Spouse name: Cory Johnson  . Number of children: 5  . Years of education: college  . Highest education level: Not on file  Occupational History  . Occupation: DIRECTOR    Employer: [employer]  Social Needs  . Financial resource strain: Not on file  . Food insecurity    Worry: Not on file    Inability: Not on file  . Transportation needs    Medical: Not on file    Non-medical: Not on file  Tobacco Use  . Smoking status: Never Smoker  . Smokeless tobacco: Never Used  Substance and Sexual Activity  . Alcohol use: No  . Drug use: No  . Sexual  activity: Yes    Birth control/protection: Surgical  Lifestyle  . Physical activity    Days per week: Not on file    Minutes per session: Not on file  . Stress: Not on file  Relationships  . Social Musicianconnections    Talks on phone: Not on file    Gets together: Not on file    Attends religious service: Not on file    Active member of club or organization: Not on file    Attends meetings of clubs or organizations: Not on file    Relationship status: Not on file  . Intimate partner violence    Fear of current or ex partner: Not on file    Emotionally abused: Not on file    Physically abused: Not on file    Forced sexual activity: Not on file  Other Topics Concern  . Not on file  Social History Narrative   Patient lives at home with his wife Cory Spurr([name]).   Disabled.   Education college.   Right handed.  Caffeine one cup of coffee daily.    Review of Systems Per HPI.     Objective:   Physical Exam Vitals signs reviewed.  Constitutional:      Appearance: He is well-developed. He is not ill-appearing.     Comments: Thin appearing, no acute distress.  HENT:     Head: Normocephalic and atraumatic.  Eyes:     Pupils: Pupils are equal, round, and reactive to light.  Neck:     Vascular: No carotid bruit or JVD.  Cardiovascular:     Rate and Rhythm: Normal rate. Rhythm irregular.     Comments: Few ectopic beats during exam, overall normal rate. Pulmonary:     Effort: Pulmonary effort is normal.     Breath sounds: Normal breath sounds. No rales.  Abdominal:     General: Abdomen is flat. There is no distension.     Tenderness: There is no abdominal tenderness. There is no right CVA tenderness, left CVA tenderness or guarding.  Genitourinary:    Rectum: No tenderness (Small noted dark stool with DRE, no visible blood).  Skin:    General: Skin is warm and dry.  Neurological:     Mental Status: He is alert and oriented to person, place, and time.  Psychiatric:        Mood and  Affect: Mood normal.    Vitals:   09/20/18 1212  BP: 112/72  Pulse: 78  Resp: 18  Temp: 98.2 F (36.8 C)  SpO2: 97%  Weight: 170 lb 6.4 oz (77.3 kg)  Height: 6\' 2"  (1.88 m)    Results for orders placed or performed in visit on 09/20/18  POCT CBC  Result Value Ref Range   WBC 5.1 4.6 - 10.2 K/uL   Lymph, poc 0.8 0.6 - 3.4   POC LYMPH PERCENT 16.4 10 - 50 %L   MID (cbc) 0.3 0 - 0.9   POC MID % 5.1 0 - 12 %M   POC Granulocyte 4.0 2 - 6.9   Granulocyte percent 78.5 37 - 80 %G   RBC 4.68 (A) 4.69 - 6.13 M/uL   Hemoglobin 13.6 11 - 14.6 g/dL   HCT, POC 29.540.7 29 - 41 %   MCV 87.1 76 - 111 fL   MCH, POC 29.0 27 - 31.2 pg   MCHC 33.3 31.8 - 35.4 g/dL   RDW, POC 62.113.8 %   Platelet Count, POC 291 142 - 424 K/uL   MPV 5.9 0 - 99.8 fL  IFOBT POC (occult bld, rslt in office)  Result Value Ref Range   IFOBT Negative    EKG: Difficult interpretation with baseline tremor.  Initial reading A. fib/flutter, repeat a flutter.  I do see P waves in V3 with some regularity on initial EKG, then irregular rhythm on repeat.     Assessment & Plan:  Cory Johnson is a 63 y.o. male Ectopic beat - Plan: EKG 12-Lead  Dark stools - Plan: POCT CBC, IFOBT POC (occult bld, rslt in office), IFOBT POC (occult bld, rslt in office), CANCELED: IFOBT POC (occult bld, rslt in office)  Sepsis secondary to UTI (HCC)  Decreased appetite - Plan: POCT CBC  Fatigue, unspecified type - Plan: POCT CBC, Comprehensive metabolic panel  Hypokalemia - Plan: Comprehensive metabolic panel  Hypoalbuminemia - Plan: Prealbumin, Comprehensive metabolic panel  Loss of weight - Plan: Prealbumin, Comprehensive metabolic panel  Recent hospitalization for altered mental status, sepsis due to urinary tract infection.  Urinary symptoms have cleared.  Denies fever, abdominal pain, back pain or change in mental status.  Unfortunately has had 4-5 episodes of loose stools per day with incontinence over the past week and  reports black stool each time, slightly lightening in color today. Concern for GI bleed, but heme-negative in office today and CBC appears stable without anemia.  However he did have hypokalemia in the hospital, has had decreased intake and with increased output with diarrhea, concern for electrolyte abnormality that may be contributing to arrhythmia.   -Recommended further evaluation through the emergency room for electrolytes, repeat EKG and assessment with possible cardiac monitoring.  Will send by private vehicle given stable symptoms at present.  Wife present, understanding of plan expressed with 911 precautions if any change in symptoms.   I did order prealbumin, CMP for follow-up of hypoalbuminemia, losing weight which is likely due to decreased appetite.  We will follow-up after ER visit to discuss nutrition.  We will also assess stability of symptoms with home PT/OT as nursing facility recommended after hospitalization for deconditioning.     No orders of the defined types were placed in this encounter.  There are no Patient Instructions on file for this visit. Signed,   Meredith StaggersJeffrey Chiffon Kittleson, MD Primary Care at [facility].  09/20/18 1:41 PM

## 2018-09-21 LAB — COMPREHENSIVE METABOLIC PANEL
ALT: 11 IU/L (ref 0–44)
AST: 15 IU/L (ref 0–40)
Albumin/Globulin Ratio: 1.4 (ref 1.2–2.2)
Albumin: 4 g/dL (ref 3.8–4.8)
Alkaline Phosphatase: 59 IU/L (ref 39–117)
BUN/Creatinine Ratio: 8 — ABNORMAL LOW (ref 10–24)
BUN: 7 mg/dL — ABNORMAL LOW (ref 8–27)
Bilirubin Total: 0.5 mg/dL (ref 0.0–1.2)
CO2: 25 mmol/L (ref 20–29)
Calcium: 9.2 mg/dL (ref 8.6–10.2)
Chloride: 99 mmol/L (ref 96–106)
Creatinine, Ser: 0.84 mg/dL (ref 0.76–1.27)
GFR calc Af Amer: 106 mL/min/{1.73_m2} (ref >59)
GFR calc non Af Amer: 92 mL/min/{1.73_m2} (ref >59)
Globulin, Total: 2.9 g/dL (ref 1.5–4.5)
Glucose: 79 mg/dL (ref 65–99)
Potassium: 4.1 mmol/L (ref 3.5–5.2)
Sodium: 139 mmol/L (ref 134–144)
Total Protein: 6.9 g/dL (ref 6.0–8.5)

## 2018-09-21 LAB — PREALBUMIN: PREALBUMIN: 17 mg/dL (ref 10–36)

## 2018-09-23 DIAGNOSIS — Z79899 Other long term (current) drug therapy: Secondary | ICD-10-CM | POA: Diagnosis not present

## 2018-09-23 DIAGNOSIS — Z5181 Encounter for therapeutic drug level monitoring: Secondary | ICD-10-CM | POA: Diagnosis not present

## 2018-09-25 ENCOUNTER — Ambulatory Visit: Payer: Medicare Other | Admitting: Family Medicine

## 2018-10-01 ENCOUNTER — Encounter (HOSPITAL_COMMUNITY): Payer: Self-pay | Admitting: Radiology

## 2018-10-07 DIAGNOSIS — R3915 Urgency of urination: Secondary | ICD-10-CM | POA: Diagnosis not present

## 2018-10-07 DIAGNOSIS — R8279 Other abnormal findings on microbiological examination of urine: Secondary | ICD-10-CM | POA: Diagnosis not present

## 2018-12-20 DIAGNOSIS — Z79899 Other long term (current) drug therapy: Secondary | ICD-10-CM | POA: Diagnosis not present

## 2019-01-01 ENCOUNTER — Encounter: Payer: Self-pay | Admitting: Family Medicine

## 2019-01-01 DIAGNOSIS — F0280 Dementia in other diseases classified elsewhere without behavioral disturbance: Secondary | ICD-10-CM | POA: Insufficient documentation

## 2019-01-24 DIAGNOSIS — Z79891 Long term (current) use of opiate analgesic: Secondary | ICD-10-CM | POA: Diagnosis not present

## 2019-01-24 DIAGNOSIS — M5136 Other intervertebral disc degeneration, lumbar region: Secondary | ICD-10-CM | POA: Diagnosis not present

## 2019-01-24 DIAGNOSIS — M5030 Other cervical disc degeneration, unspecified cervical region: Secondary | ICD-10-CM | POA: Diagnosis not present

## 2019-01-24 DIAGNOSIS — G894 Chronic pain syndrome: Secondary | ICD-10-CM | POA: Diagnosis not present

## 2019-01-29 ENCOUNTER — Other Ambulatory Visit: Payer: Self-pay

## 2019-01-29 ENCOUNTER — Encounter: Payer: Self-pay | Admitting: Family Medicine

## 2019-01-29 ENCOUNTER — Ambulatory Visit (INDEPENDENT_AMBULATORY_CARE_PROVIDER_SITE_OTHER): Payer: Medicare Other | Admitting: Family Medicine

## 2019-01-29 VITALS — BP 127/82 | HR 77 | Temp 98.1°F | Ht 74.0 in | Wt 173.0 lb

## 2019-01-29 DIAGNOSIS — Z0001 Encounter for general adult medical examination with abnormal findings: Secondary | ICD-10-CM | POA: Diagnosis not present

## 2019-01-29 DIAGNOSIS — G3183 Dementia with Lewy bodies: Secondary | ICD-10-CM

## 2019-01-29 DIAGNOSIS — R251 Tremor, unspecified: Secondary | ICD-10-CM | POA: Diagnosis not present

## 2019-01-29 DIAGNOSIS — Z0000 Encounter for general adult medical examination without abnormal findings: Secondary | ICD-10-CM

## 2019-01-29 DIAGNOSIS — D649 Anemia, unspecified: Secondary | ICD-10-CM | POA: Diagnosis not present

## 2019-01-29 DIAGNOSIS — Z13220 Encounter for screening for lipoid disorders: Secondary | ICD-10-CM

## 2019-01-29 DIAGNOSIS — R1031 Right lower quadrant pain: Secondary | ICD-10-CM

## 2019-01-29 DIAGNOSIS — F0281 Dementia in other diseases classified elsewhere with behavioral disturbance: Secondary | ICD-10-CM

## 2019-01-29 DIAGNOSIS — F02818 Dementia in other diseases classified elsewhere, unspecified severity, with other behavioral disturbance: Secondary | ICD-10-CM

## 2019-01-29 LAB — POCT CBC
Granulocyte percent: 84.9 %G — AB (ref 37–80)
HCT, POC: 41 % (ref 29–41)
Hemoglobin: 14.2 g/dL (ref 11–14.6)
Lymph, poc: 0.6 (ref 0.6–3.4)
MCH, POC: 30 pg (ref 27–31.2)
MCHC: 347 g/dL — AB (ref 31.8–35.4)
MCV: 86.3 fL (ref 76–111)
MID (cbc): 0.5 (ref 0–0.9)
MPV: 6.7 fL (ref 0–99.8)
POC Granulocyte: 6.2 (ref 2–6.9)
POC LYMPH PERCENT: 8.3 %L — AB (ref 10–50)
POC MID %: 6.8 %M (ref 0–12)
Platelet Count, POC: 197 10*3/uL (ref 142–424)
RBC: 4.75 M/uL (ref 4.69–6.13)
RDW, POC: 13.9 %
WBC: 7.3 10*3/uL (ref 4.6–10.2)

## 2019-01-29 NOTE — Patient Instructions (Addendum)
I will have an ultrasound ordered to evaluate for possible hernia.  If any new or worsening swelling in the groin or urinary symptoms, be seen right away.  Keep follow-up with your specialist.  I will check a screening blood count with history of anemia but continue B12 supplement.  I will also check screening cholesterol levels.  Let me know if you change your mind about flu shot or shingles shot.    Preventive Care 27 Years and Older, Male Preventive care refers to lifestyle choices and visits with your health care provider that can promote health and wellness. This includes:  A yearly physical exam. This is also called an annual well check.  Regular dental and eye exams.  Immunizations.  Screening for certain conditions.  Healthy lifestyle choices, such as diet and exercise. What can I expect for my preventive care visit? Physical exam Your health care provider will check:  Height and weight. These may be used to calculate body mass index (BMI), which is a measurement that tells if you are at a healthy weight.  Heart rate and blood pressure.  Your skin for abnormal spots. Counseling Your health care provider may ask you questions about:  Alcohol, tobacco, and drug use.  Emotional well-being.  Home and relationship well-being.  Sexual activity.  Eating habits.  History of falls.  Memory and ability to understand (cognition).  Work and work Statistician. What immunizations do I need?  Influenza (flu) vaccine  This is recommended every year. Tetanus, diphtheria, and pertussis (Tdap) vaccine  You may need a Td booster every 10 years. Varicella (chickenpox) vaccine  You may need this vaccine if you have not already been vaccinated. Zoster (shingles) vaccine  You may need this after age 47. Pneumococcal conjugate (PCV13) vaccine  One dose is recommended after age 97. Pneumococcal polysaccharide (PPSV23) vaccine  One dose is recommended after age  45. Measles, mumps, and rubella (MMR) vaccine  You may need at least one dose of MMR if you were born in 1957 or later. You may also need a second dose. Meningococcal conjugate (MenACWY) vaccine  You may need this if you have certain conditions. Hepatitis A vaccine  You may need this if you have certain conditions or if you travel or work in places where you may be exposed to hepatitis A. Hepatitis B vaccine  You may need this if you have certain conditions or if you travel or work in places where you may be exposed to hepatitis B. Haemophilus influenzae type b (Hib) vaccine  You may need this if you have certain conditions. You may receive vaccines as individual doses or as more than one vaccine together in one shot (combination vaccines). Talk with your health care provider about the risks and benefits of combination vaccines. What tests do I need? Blood tests  Lipid and cholesterol levels. These may be checked every 5 years, or more frequently depending on your overall health.  Hepatitis C test.  Hepatitis B test. Screening  Lung cancer screening. You may have this screening every year starting at age 74 if you have a 30-pack-year history of smoking and currently smoke or have quit within the past 15 years.  Colorectal cancer screening. All adults should have this screening starting at age 64 and continuing until age 69. Your health care provider may recommend screening at age 47 if you are at increased risk. You will have tests every 1-10 years, depending on your results and the type of screening test.  Prostate  cancer screening. Recommendations will vary depending on your family history and other risks.  Diabetes screening. This is done by checking your blood sugar (glucose) after you have not eaten for a while (fasting). You may have this done every 1-3 years.  Abdominal aortic aneurysm (AAA) screening. You may need this if you are a current or former smoker.  Sexually  transmitted disease (STD) testing. Follow these instructions at home: Eating and drinking  Eat a diet that includes fresh fruits and vegetables, whole grains, lean protein, and low-fat dairy products. Limit your intake of foods with high amounts of sugar, saturated fats, and salt.  Take vitamin and mineral supplements as recommended by your health care provider.  Do not drink alcohol if your health care provider tells you not to drink.  If you drink alcohol: ? Limit how much you have to 0-2 drinks a day. ? Be aware of how much alcohol is in your drink. In the [place] one drink equals one 12 oz bottle of beer (355 mL), one 5 oz glass of wine (148 mL), or one 1 oz glass of hard liquor (44 mL). Lifestyle  Take daily care of your teeth and gums.  Stay active. Exercise for at least 30 minutes on 5 or more days each week.  Do not use any products that contain nicotine or tobacco, such as cigarettes, e-cigarettes, and chewing tobacco. If you need help quitting, ask your health care provider.  If you are sexually active, practice safe sex. Use a condom or other form of protection to prevent STIs (sexually transmitted infections).  Talk with your health care provider about taking a low-dose aspirin or statin. What's next?  Visit your health care provider once a year for a well check visit.  Ask your health care provider how often you should have your eyes and teeth checked.  Stay up to date on all vaccines. This information is not intended to replace advice given to you by your health care provider. Make sure you discuss any questions you have with your health care provider. Document Released: 03/26/2015 Document Revised: 02/21/2018 Document Reviewed: 02/21/2018 Elsevier Patient Education  [employer].   If you have lab work done today you will be contacted with your lab results within the next 2 weeks.  If you have not heard from [place] then please contact us. The fastest way to get  your results is to register for My Chart.   IF you received an x-ray today, you will receive an invoice from [facility]. Please contact [facility] at [phone] with questions or concerns regarding your invoice.   IF you received labwork today, you will receive an invoice from [facility]. Please contact [facility] at [phone] with questions or concerns regarding your invoice.   Our billing staff will not be able to assist you with questions regarding bills from these companies.  You will be contacted with the lab results as soon as they are available. The fastest way to get your results is to activate your My Chart account. Instructions are located on the last page of this paperwork. If you have not heard from [place] regarding the results in 2 weeks, please contact this office.

## 2019-01-29 NOTE — Progress Notes (Signed)
Subjective:  Patient ID: Cory Johnson, male    DOB: [birth year]  Age: 63 y.o. MRN: [id number]  CC: No chief complaint on file.   HPI Cory Johnson presents for  Annual exam/physical.   Care team: Neurology, Dr. Verita Schneiders at [facility], Dr. Krista Blue locally.  Urology Dr. Karsten Ro Ortho/PMR: Dr. [name]   Lewy body dementia: Followed by neurology at [facility], Dr. Linus Mako,  appointment with PA Nyoka Cowden on 10/9.  Taking Sinemet every 7 hours at that time, worse at night with more tremors.  Denies dyskinesias.  Notes reviewed concerning accepting that diagnosis and request for neuropsych testing.  Neuropsych referral placed.  Tigan 300 mg 3 times daily with Sinemet 25/100 for improved tolerability.  12-monthfollow-up.  Taking 3 sinemet per day. Not taking tigan. No n/v.   Sepsis secondary to UTI admitted June 30-July 2 with altered mental status.  Treated with Bactrim for Citrobacter.  Also treated with B12 replacement due to lower range normal B12.  Diarrhea present at July 10 follow-up, plan for ER evaluation given hypokalemia in the hospital, and arrhythmia in office(EKG indicated possible A. fib/flutter, repeated indicating a flutter.  Initial EKG with P waves, repeat with irregular rhythm, difficult interpretation with baseline tremor). He went to ER but left after triage. Did have lower HGB on ER labs - 13.6-12.9 (same day Urologist Dr. OKarsten Ro appointment June 26.  Myrbetriq for BPH with LUTS.  Previous thrombocytopenia and improved on recheck July 10. B12 350 in July. B12 supplement daily.   Lab Results  Component Value Date   WBC 6.5 09/20/2018   HGB 12.9 (L) 09/20/2018   HCT 40.2 09/20/2018   MCV 91.8 09/20/2018   PLT 249 09/20/2018    Lab Results  Component Value Date   CHOL 160 05/24/2017   HDL 62 05/24/2017   LDLCALC 86 05/24/2017   TRIG 60 05/24/2017   CHOLHDL 2.6 05/24/2017     Chronic neck, back pain.: [facility]  Magnesium at bedtime discussed at July 13 visit.  MiraLAX for bowel regimen, continued on hydrocodone.  Assistive device for walking discussed at that visit. Feels like he does not need any assistive device.   Sore area/swollen area few weeks in R groin.  Getting a little bigger. No dysuria. No new urinary symptoms No fever. Unsure if prior hernia, no surgery.  Tx: none.   Cancer screening: Colonoscopy 05/29/2016, repeat 10 years. PSA followed by urology, Dr. OKarsten Ro history of elevated PSA.  Appt with Dr. OConsuella Loseabout a week ago. PSA pending. Not on myrbetriq.   Immunization History  Administered Date(s) Administered  . Tdap 05/24/2017, 06/21/2018  Flu vaccine:refuses.  Shingles vaccine: refuses.   Fall Risk  01/29/2019 09/19/2018 09/17/2017 08/30/2017 06/21/2017  Falls in the past year? 0 0 No No No  Comment - - - - -  Number falls in past yr: 0 0 - - -  Injury with Fall? 0 0 - - -  Comment - - - - -  Risk for fall due to : Impaired mobility;Impaired balance/gait;Mental status change - - - -  Follow up - Falls evaluation completed - - -   Loose rugs:none Lighting in home:adequate. Has assistive bars for toilet and shower.   Stairs: 1 flight, with handbar. No falls, feels stable walking stairs.   Dep[facility] screen [facility] 01/29/2019 09/19/2018 09/17/2017 08/30/2017 06/21/2017  Decreased Interest 0 0 0 0 0  Down, Depressed, Hopeless 0 0  0 0 0  PHQ - 2 Score 0 0 0 0 0    Functional Status Survey: Is the patient deaf or have difficulty hearing?: No Does the patient have difficulty seeing, even when wearing glasses/contacts?: No Does the patient have difficulty concentrating, remembering, or making decisions?: Yes Does the patient have difficulty walking or climbing stairs?: Yes Does the patient have difficulty dressing or bathing?: Yes Does the patient have difficulty doing errands alone such as visiting a doctor's office or shopping?: Yes(does not drive anymore) Denies  difficulty with stairs/walking, denies difficulty with dressing/bathing.  Not driving d/t tremor.   6CIT Screen 01/29/2019  What Year? 0 points  What month? 0 points  What time? (No Data)  Count back from 20 0 points  Months in reverse 0 points  Repeat phrase 0 points    Hearing Screening   125Hz  250Hz  500Hz  1000Hz  2000Hz  3000Hz  4000Hz  6000Hz  8000Hz   Right ear:           Left ear:           Vision Screening Comments: Did not wear glasses today    Office Visit from 01/29/2019 in Primary Care at [facility]  AUDIT-C Score  0      Dental: dentures. Prior dental clinic - over 1 year.   Exercise/activity:walking at [facility] track, neighborhood few times per week. Prior gym membership.   Advanced directives: Handout given, no current advance directive.  Full code.   History Patient Active Problem List   Diagnosis Date Noted  . Lewy body dementia ([place]) 01/01/2019  . UTI (urinary tract infection) 09/10/2018  . Sepsis ([place]) 09/10/2018  . Acute metabolic encephalopathy 85/04/7739  . Thrombocytopenia ([place]) 09/10/2018  . Total bilirubin, elevated 09/10/2018  . Cerebellar ataxia in diseases classified elsewhere ([place]) 09/17/2017  . REM sleep behavior disorder 09/17/2017  . Chronic pain syndrome 05/21/2017  . Tremor 05/08/2017  . Gait abnormality 05/08/2017  . Transient hypotension 05/05/2017  . Contracture of joint of finger 04/16/2017  . Long-term current use of opiate analgesic 04/02/2017  . Malnutrition of moderate degree 03/28/2017  . Syncope and collapse 03/26/2017  . Closed fracture of shaft of left humerus 03/19/2017  . Subungual hematoma of great toe of right foot 12/13/2016  . Pain of toe of right foot 12/13/2016  . Paresthesia 06/29/2016  . Tremor of right hand 06/29/2016  . Mild cognitive impairment 06/29/2016  . Tremors of nervous system 06/22/2016  . Drooling 06/22/2016  . Loss of weight 06/22/2016  . Family history of throat cancer 06/22/2016  . Abnormality of gait  06/09/2016  . Acute bronchitis 03/20/2016  . Memory loss 12/31/2013  . Back pain 08/18/2013  . Arthritis   . Myoclonic jerking   . Right inguinal hernia 03/17/2011  . Umbilical hernia 28/78/6767  . Acute pulmonary embolus ([place]) 11/07/2009  . DVT of lower extremity (deep venous thrombosis) ([place]) 11/07/2009   Past Medical History:  Diagnosis Date  . Acute pulmonary embolus ([place]) 11/07/2009   pt denies  . Arthritis    neck - limited range of motion; back, shoulders  . Chronic back pain   . Difficult intubation   . DVT of lower extremity (deep venous thrombosis) ([place]) 11/07/2009  . Occasional tremors 03/2017   HANDS   . Right inguinal hernia 02/2011  . Seizures ([place])    pt denies  . Syncope and collapse 03/26/2017  . Umbilical hernia 20/9470   Past Surgical History:  Procedure Laterality Date  . CLOSED REDUCTION FINGER WITH  PERCUTANEOUS PINNING Left 06/26/2018   Procedure: CLOSED REDUCTION FINGER WITH PERCUTANEOUS PINNING;  Surgeon: Verner Mould, MD;  Location: [place];  Service: Orthopedics;  Laterality: Left;  . HERNIA REPAIR    . I&D EXTREMITY Left 06/26/2018   Procedure: Left Index finger wound exploration, irrigation and debridement, fracture fixation, flexor tendon repair, digital nerve repair with conduit as needed and surgery as indicated;  Surgeon: Verner Mould, MD;  Location: [place];  Service: Orthopedics;  Laterality: Left;  48mn  . INGUINAL HERNIA REPAIR  03/17/2011   Procedure: HERNIA REPAIR INGUINAL ADULT;  Surgeon: HAdin Hector MD;  [place]  Service: General;  Laterality: Right;  right inguinal hernia repair with mesh  . [place]  left  . NERVE AND TENDON REPAIR Left 06/26/2018   Procedure: NERVE AND TENDON REPAIR;  Surgeon: CVerner Mould MD;  [facility]  Service: Orthopedics;  Laterality: Left;  . UMBILICAL HERNIA REPAIR  03/17/2011    Procedure: HERNIA REPAIR UMBILICAL ADULT;  Surgeon: HAdin Hector MD;  [facility] [facility]  Service: General;  Laterality: N/A;  umbilical hernia repair with mesh   No Known Allergies Prior to Admission medications   Medication Sig Start Date End Date Taking? Authorizing Provider  Carbidopa-Levodopa ER (SINEMET CR) 25-100 MG tablet controlled release Take by mouth. 09/18/18 10/02/19  [provider]  fexofenadine (ALLEGRA) 180 MG tablet fexofenadine 180 mg tablet  TK 1 T PO  QID    [provider]  HYDROcodone-acetaminophen (NORCO) 10-325 MG tablet Take 1 tablet by mouth 5 (five) times daily as needed for moderate pain. 08/02/18   [provider]  sulfamethoxazole-trimethoprim (BACTRIM DS) 800-160 MG tablet Take 1 tablet by mouth 2 (two) times daily. Patient not taking: Reported on 09/19/2018 09/12/18   MKathie Dike MD  vitamin B-12 (CYANOCOBALAMIN) 500 MCG tablet Take 1 tablet (500 mcg total) by mouth daily. 09/13/18   MKathie Dike MD   Social History   Socioeconomic History  . Marital status: Married    Spouse name: WDerwood Kaplan . Number of children: 5  . Years of education: college  . Highest education level: Not on file  Occupational History  . Occupation: DIRECTOR    Employer: [employer] . Financial resource strain: Not on file  . Food insecurity    Worry: Not on file    Inability: Not on file  . Transportation needs    Medical: Not on file    Non-medical: Not on file  Tobacco Use  . Smoking status: Never Smoker  . Smokeless tobacco: Never Used  Substance and Sexual Activity  . Alcohol use: No  . Drug use: No  . Sexual activity: Yes    Birth control/protection: Surgical  Lifestyle  . Physical activity    Days per week: Not on file    Minutes per session: Not on file  . Stress: Not on file  Relationships  . Social [place]: Not on file    Gets together: Not on file     Attends religious service: Not on file    Active member of club or organization: Not on file    Attends meetings of clubs or organizations: Not on file    Relationship status: Not on file  . Intimate partner violence    Fear of current or ex  partner: Not on file    Emotionally abused: Not on file    Physically abused: Not on file    Forced sexual activity: Not on file  Other Topics Concern  . Not on file  Social History Narrative   Patient lives at home with his wife [name]).   Disabled.   Education college.   Right handed.   Caffeine one cup of coffee daily.    Review of Systems Per HPI, otherwise 13 point negative.   Objective:   Vitals:   01/29/19 1412  BP: 127/82  Pulse: 70  Temp: 98.1 F (36.7 C)  SpO2: (!) 70%  Weight: 173 lb (78.5 kg)  Height: 6' 2"  (1.88 m)     Physical Exam Vitals signs reviewed.  Constitutional:      Appearance: He is well-developed.  HENT:     Head: Normocephalic and atraumatic.     Right Ear: External ear normal.     Left Ear: External ear normal.  Eyes:     Conjunctiva/sclera: Conjunctivae normal.     Pupils: Pupils are equal, round, and reactive to light.  Neck:     Musculoskeletal: Normal range of motion and neck supple.     Thyroid: No thyromegaly.  Cardiovascular:     Rate and Rhythm: Normal rate and regular rhythm.  Pulmonary:     Effort: Pulmonary effort is normal. No respiratory distress.     Breath sounds: Normal breath sounds. No wheezing.  Abdominal:     General: There is no distension.     Palpations: Abdomen is soft.     Tenderness: There is no abdominal tenderness.     Hernia: There is no hernia in the left inguinal area or right inguinal area.    Genitourinary:    Prostate: Normal.  Musculoskeletal: Normal range of motion.        General: No tenderness.  Lymphadenopathy:     Cervical: No cervical adenopathy.  Skin:    General: Skin is warm and dry.  Neurological:     Mental Status: He is alert and  oriented to person, place, and time.     Motor: Tremor present.     Deep Tendon Reflexes: Reflexes are normal and symmetric.  Psychiatric:        Behavior: Behavior normal.      Assessment & Plan:  Cory Johnson is a 63 y.o. male . Welcome to Medicare preventive visit  - - anticipatory guidance as below in AVS, screening labs if needed. Health maintenance items as above in HPI discussed/recommended as applicable.  - no concerning responses on depression, fall, or functional status screening. Any positive responses noted as above. Advanced directives discussed as in CHL.   - vaccines recommended - declined at present.   Right groin pain - Plan: US Pelvis Limited  -Check ultrasound, not definitive for hernia on exam.  RTC precautions.  Anemia, unspecified type - Plan: POCT CBC  -Continue B12, check CBC.  Lewy body dementia with behavioral disturbance (HCC) Tremor  -Tolerating meds, continue follow-up with movement specialist/neurology.  Screening for hyperlipidemia - Plan: Lipid panel   No orders of the defined types were placed in this encounter.  Patient Instructions    I will have an ultrasound ordered to evaluate for possible hernia.  If any new or worsening swelling in the groin or urinary symptoms, be seen right away.  Keep follow-up with your specialist.  I will check a screening blood count with history of anemia but continue  B12 supplement.  I will also check screening cholesterol levels.  Let me know if you change your mind about flu shot or shingles shot.    Preventive Care 31 Years and Older, Male Preventive care refers to lifestyle choices and visits with your health care provider that can promote health and wellness. This includes:  A yearly physical exam. This is also called an annual well check.  Regular dental and eye exams.  Immunizations.  Screening for certain conditions.  Healthy lifestyle choices, such as diet and exercise. What can I  expect for my preventive care visit? Physical exam Your health care provider will check:  Height and weight. These may be used to calculate body mass index (BMI), which is a measurement that tells if you are at a healthy weight.  Heart rate and blood pressure.  Your skin for abnormal spots. Counseling Your health care provider may ask you questions about:  Alcohol, tobacco, and drug use.  Emotional well-being.  Home and relationship well-being.  Sexual activity.  Eating habits.  History of falls.  Memory and ability to understand (cognition).  Work and work Statistician. What immunizations do I need?  Influenza (flu) vaccine  This is recommended every year. Tetanus, diphtheria, and pertussis (Tdap) vaccine  You may need a Td booster every 10 years. Varicella (chickenpox) vaccine  You may need this vaccine if you have not already been vaccinated. Zoster (shingles) vaccine  You may need this after age 86. Pneumococcal conjugate (PCV13) vaccine  One dose is recommended after age 42. Pneumococcal polysaccharide (PPSV23) vaccine  One dose is recommended after age 40. Measles, mumps, and rubella (MMR) vaccine  You may need at least one dose of MMR if you were born in 1957 or later. You may also need a second dose. Meningococcal conjugate (MenACWY) vaccine  You may need this if you have certain conditions. Hepatitis A vaccine  You may need this if you have certain conditions or if you travel or work in places where you may be exposed to hepatitis A. Hepatitis B vaccine  You may need this if you have certain conditions or if you travel or work in places where you may be exposed to hepatitis B. Haemophilus influenzae type b (Hib) vaccine  You may need this if you have certain conditions. You may receive vaccines as individual doses or as more than one vaccine together in one shot (combination vaccines). Talk with your health care provider about the risks and  benefits of combination vaccines. What tests do I need? Blood tests  Lipid and cholesterol levels. These may be checked every 5 years, or more frequently depending on your overall health.  Hepatitis C test.  Hepatitis B test. Screening  Lung cancer screening. You may have this screening every year starting at age 84 if you have a 30-pack-year history of smoking and currently smoke or have quit within the past 15 years.  Colorectal cancer screening. All adults should have this screening starting at age 21 and continuing until age 58. Your health care provider may recommend screening at age 31 if you are at increased risk. You will have tests every 1-10 years, depending on your results and the type of screening test.  Prostate cancer screening. Recommendations will vary depending on your family history and other risks.  Diabetes screening. This is done by checking your blood sugar (glucose) after you have not eaten for a while (fasting). You may have this done every 1-3 years.  Abdominal aortic aneurysm (AAA)  screening. You may need this if you are a current or former smoker.  Sexually transmitted disease (STD) testing. Follow these instructions at home: Eating and drinking  Eat a diet that includes fresh fruits and vegetables, whole grains, lean protein, and low-fat dairy products. Limit your intake of foods with high amounts of sugar, saturated fats, and salt.  Take vitamin and mineral supplements as recommended by your health care provider.  Do not drink alcohol if your health care provider tells you not to drink.  If you drink alcohol: ? Limit how much you have to 0-2 drinks a day. ? Be aware of how much alcohol is in your drink. In the [place] one drink equals one 12 oz bottle of beer (355 mL), one 5 oz glass of wine (148 mL), or one 1 oz glass of hard liquor (44 mL). Lifestyle  Take daily care of your teeth and gums.  Stay active. Exercise for at least 30 minutes on 5 or more  days each week.  Do not use any products that contain nicotine or tobacco, such as cigarettes, e-cigarettes, and chewing tobacco. If you need help quitting, ask your health care provider.  If you are sexually active, practice safe sex. Use a condom or other form of protection to prevent STIs (sexually transmitted infections).  Talk with your health care provider about taking a low-dose aspirin or statin. What's next?  Visit your health care provider once a year for a well check visit.  Ask your health care provider how often you should have your eyes and teeth checked.  Stay up to date on all vaccines. This information is not intended to replace advice given to you by your health care provider. Make sure you discuss any questions you have with your health care provider. Document Released: 03/26/2015 Document Revised: 02/21/2018 Document Reviewed: 02/21/2018 Elsevier Patient Education  [employer].   If you have lab work done today you will be contacted with your lab results within the next 2 weeks.  If you have not heard from [place] then please contact us. The fastest way to get your results is to register for My Chart.   IF you received an x-ray today, you will receive an invoice from [facility]. Please contact [facility] at [phone] with questions or concerns regarding your invoice.   IF you received labwork today, you will receive an invoice from [place]. Please contact [facility] at [phone] with questions or concerns regarding your invoice.   Our billing staff will not be able to assist you with questions regarding bills from these companies.  You will be contacted with the lab results as soon as they are available. The fastest way to get your results is to activate your My Chart account. Instructions are located on the last page of this paperwork. If you have not heard from [place] regarding the results in 2 weeks, please contact this office.           Signed, [name], MD [facility]

## 2019-01-30 LAB — LIPID PANEL
Chol/HDL Ratio: 2.3 ratio (ref 0.0–5.0)
Cholesterol, Total: 176 mg/dL (ref 100–199)
HDL: 76 mg/dL (ref >39)
LDL Chol Calc (NIH): 88 mg/dL (ref 0–99)
Triglycerides: 64 mg/dL (ref 0–149)
VLDL Cholesterol Cal: 12 mg/dL (ref 5–40)

## 2019-01-31 ENCOUNTER — Ambulatory Visit
Admission: RE | Admit: 2019-01-31 | Discharge: 2019-01-31 | Disposition: A | Discharge status: Home / Self Care | Payer: Medicare Other | Source: Ambulatory Visit | Attending: Family Medicine | Admitting: Family Medicine

## 2019-01-31 DIAGNOSIS — R1031 Right lower quadrant pain: Secondary | ICD-10-CM | POA: Diagnosis not present

## 2019-02-02 ENCOUNTER — Encounter: Payer: Self-pay | Admitting: Family Medicine

## 2019-02-04 ENCOUNTER — Encounter: Payer: Self-pay | Admitting: Radiology

## 2019-02-10 ENCOUNTER — Telehealth: Payer: Self-pay | Admitting: Family Medicine

## 2019-02-10 NOTE — Telephone Encounter (Signed)
Copied from [id number] [phone]. Topic: Medical Record Request - Other >> Feb 07, 2019 10:10 AM Rayann Heman wrote: Pt wife called and stated that she would like notes and labs from last physical sent over [facility]. Dr. Linus Mako (Neurology) [phone]    Route to [place] for [place] clinics. For all other clinics, route to the clinic's PEC Pool.

## 2019-02-10 NOTE — Telephone Encounter (Signed)
Copied from CRM #[id number]. Topic: Medical Record Request - Other °>> Feb 07, 2019 10:10 AM [place], Jessica L wrote: °Pt wife called and stated that she would like notes and labs from last physical sent over [facility]. Dr. Siddiqui (Neurology) °Cb#[phone] °Fax#[phone]  ° ° °Route to [facility] HIM Pool for [place] clinics. For all other clinics, route to the clinic's PEC Pool. °

## 2019-02-10 NOTE — Telephone Encounter (Signed)
Copied from CRM #[id number]. Topic: Medical Record Request - Other °>> Feb 07, 2019 10:10 AM [place], Jessica L wrote: °Pt wife called and stated that she would like notes and labs from last physical sent over [facility]. Dr. Siddiqui (Neurology) °Cb#[phone] °Fax#[phone]  ° ° °Route to [facility] HIM Pool for [place] clinics. For all other clinics, route to the clinic's PEC Pool. °

## 2019-02-13 NOTE — Telephone Encounter (Signed)
I faxed to Fax#[phone] and got a confirmation.

## 2019-02-13 NOTE — Telephone Encounter (Signed)
I spoke with pt and he gave a verbal auth to release his last cpe with labs to Dr. Linus Mako.

## 2019-03-21 IMAGING — CR DG CHEST 2V
2 series · 2 of 2 positions shown · non-contrast
Comparison: 08/20/2012

CLINICAL DATA: Syncope and dizziness

EXAM:
CHEST  2 VIEW

[chest lat]
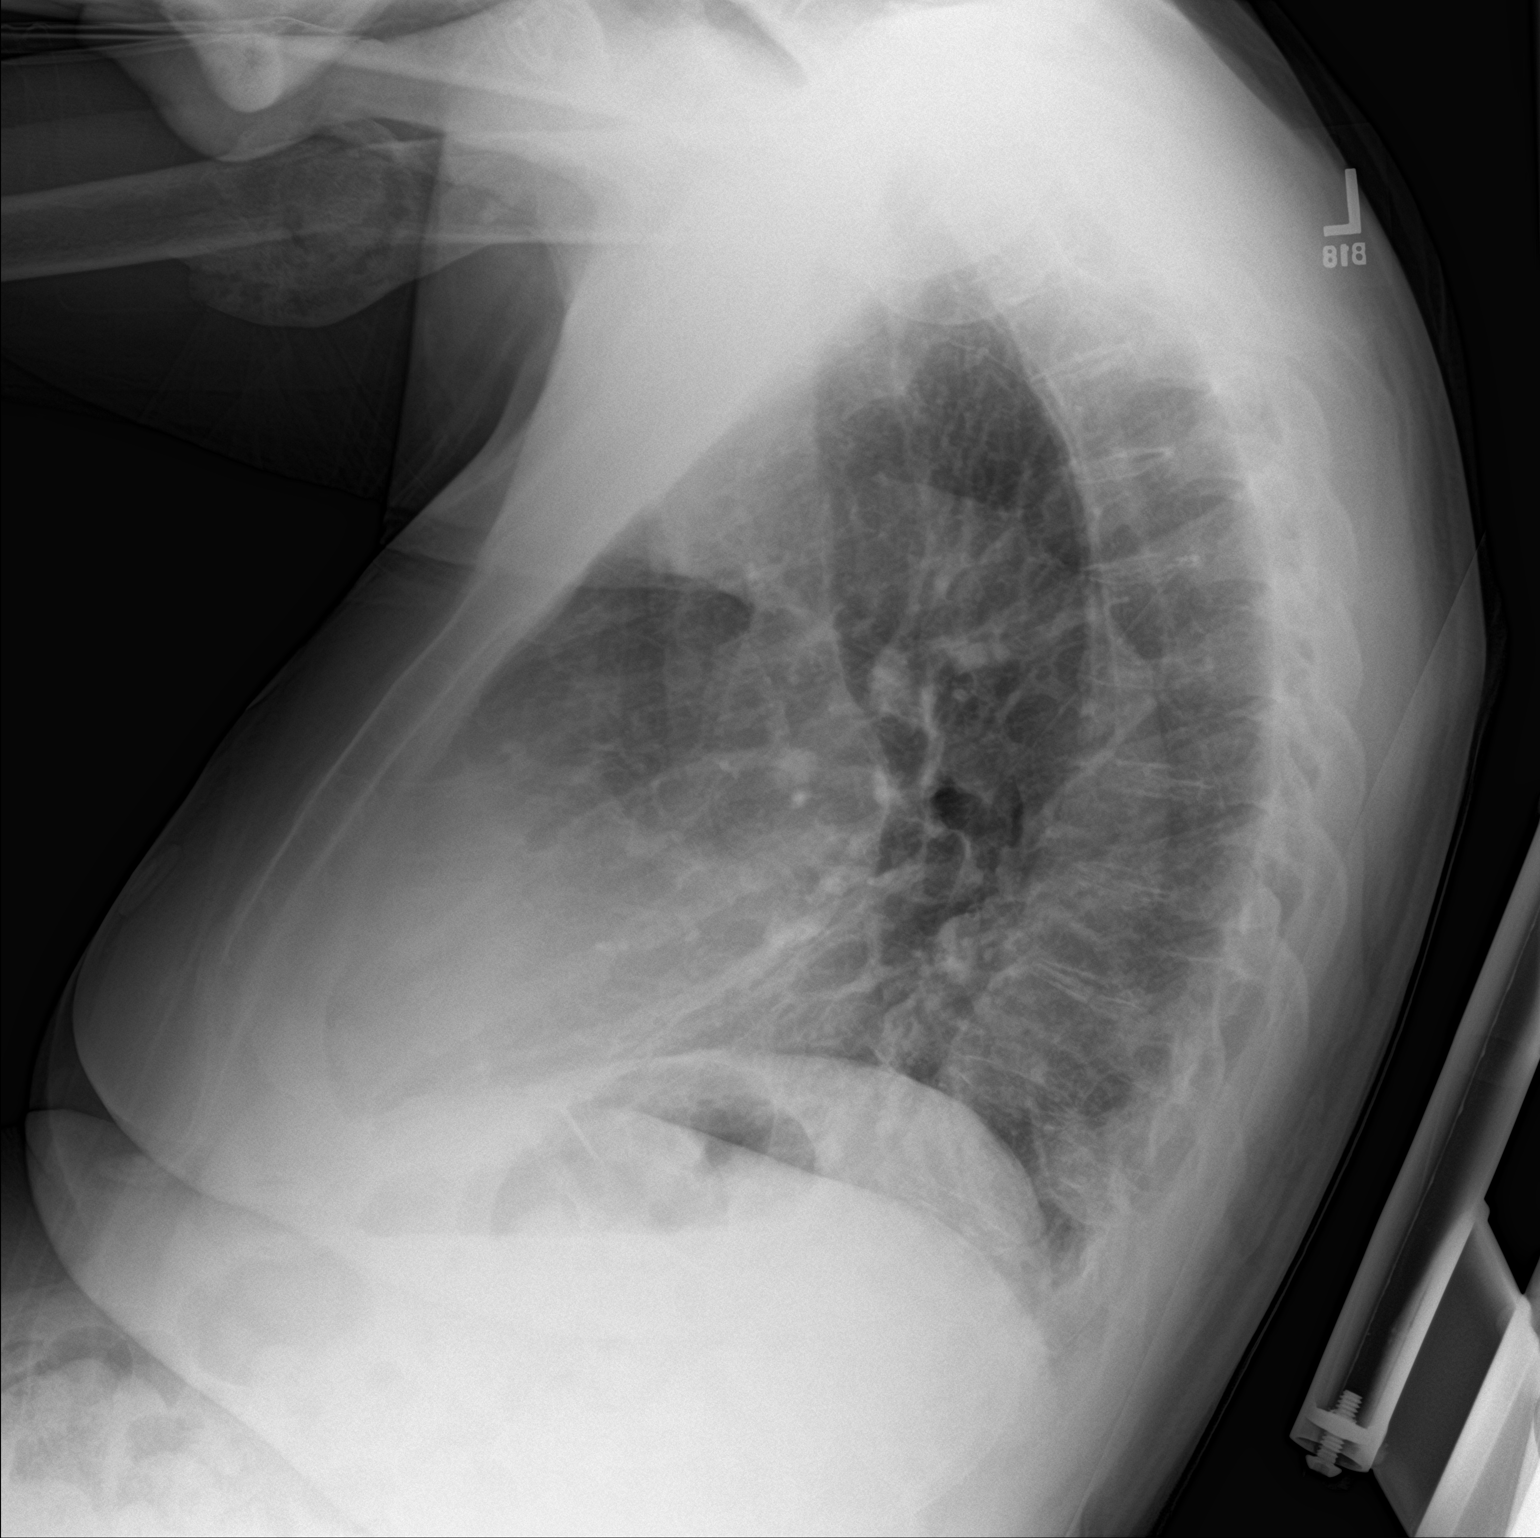

[chest ap]
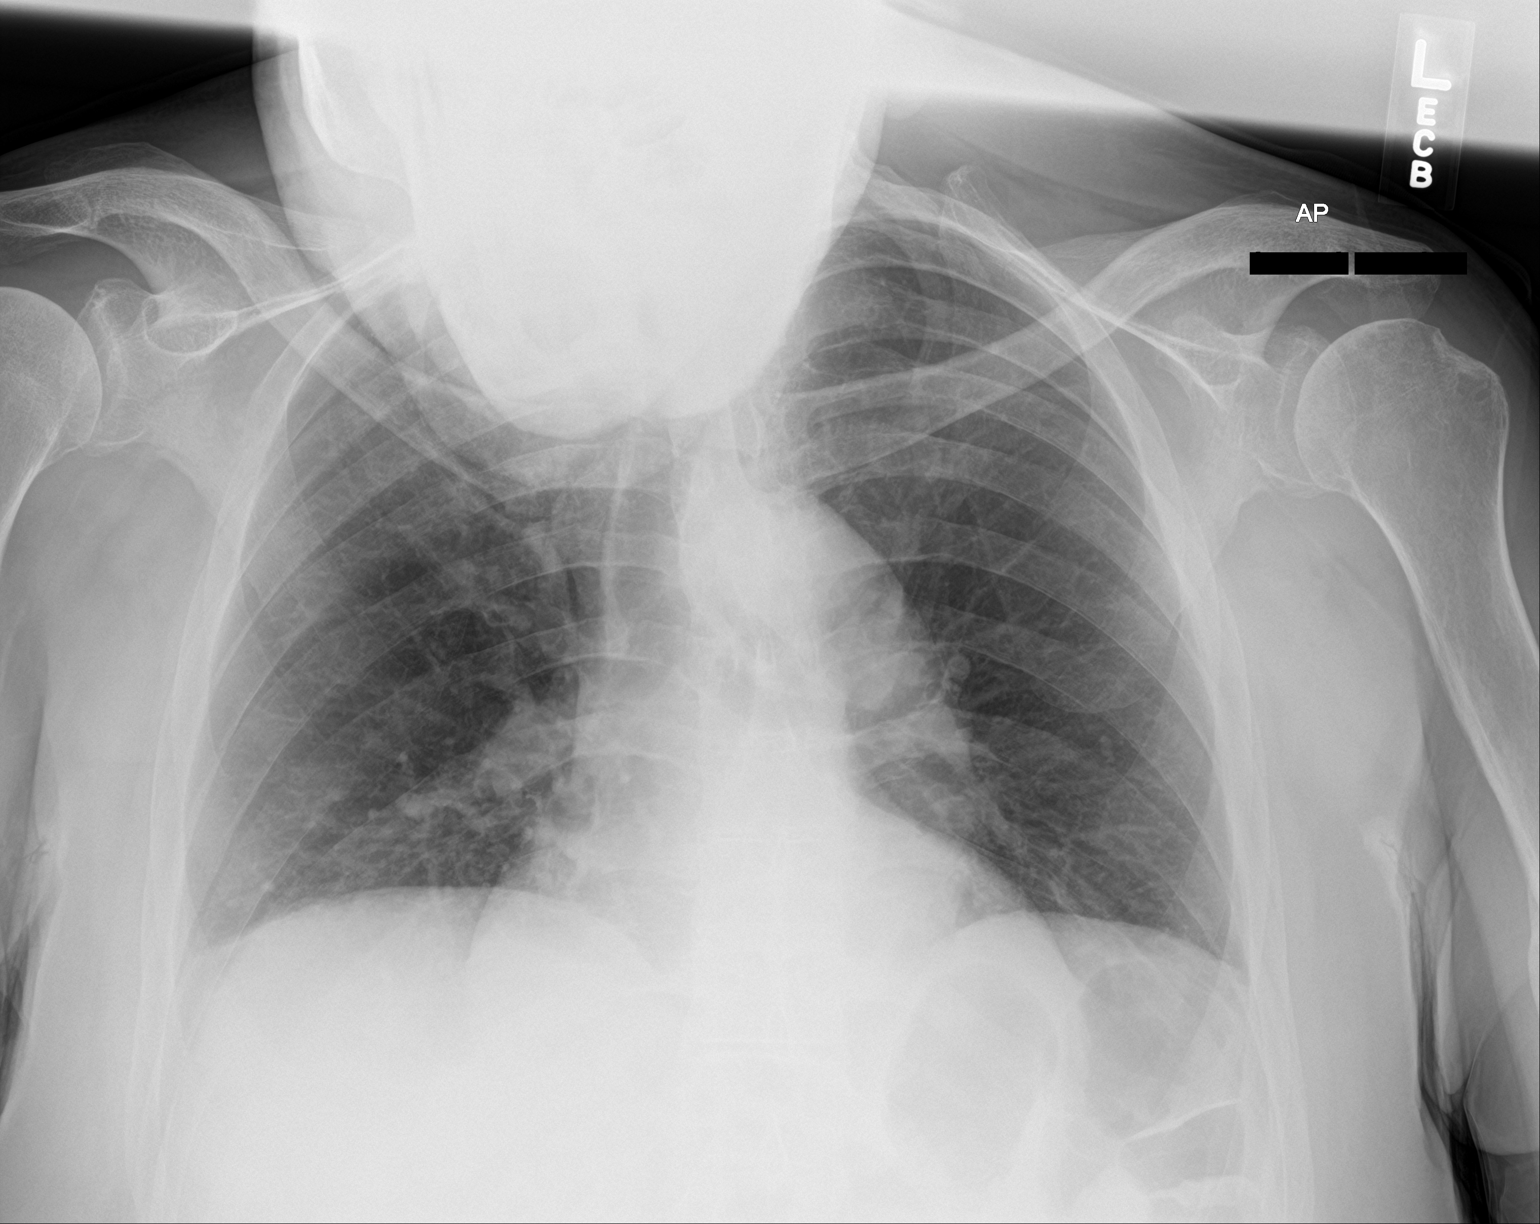

[2 of 2 positions shown; findings below may reference images not displayed]

FINDINGS: The heart size and mediastinal contours are within normal limits.
Both lungs are clear. The visualized skeletal structures are
unremarkable.
IMPRESSION: No active cardiopulmonary disease.

## 2019-04-30 ENCOUNTER — Ambulatory Visit (INDEPENDENT_AMBULATORY_CARE_PROVIDER_SITE_OTHER): Payer: Medicare Other | Admitting: Family Medicine

## 2019-04-30 ENCOUNTER — Other Ambulatory Visit: Payer: Self-pay

## 2019-04-30 VITALS — BP 94/60 | HR 66 | Temp 97.3°F | Ht 74.0 in | Wt 172.0 lb

## 2019-04-30 DIAGNOSIS — M25552 Pain in left hip: Secondary | ICD-10-CM

## 2019-04-30 NOTE — Patient Instructions (Signed)
° ° ° °  If you have lab work done today you will be contacted with your lab results within the next 2 weeks.  If you have not heard from us then please contact us. The fastest way to get your results is to register for My Chart. ° ° °IF you received an x-ray today, you will receive an invoice from [facility]. Please contact [facility] at [phone] with questions or concerns regarding your invoice.  ° °IF you received labwork today, you will receive an invoice from [facility]. Please contact [facility] at [phone] with questions or concerns regarding your invoice.  ° °Our billing staff will not be able to assist you with questions regarding bills from these companies. ° °You will be contacted with the lab results as soon as they are available. The fastest way to get your results is to activate your My Chart account. Instructions are located on the last page of this paperwork. If you have not heard from us regarding the results in 2 weeks, please contact this office. °  ° ° ° °
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
## 2019-04-30 NOTE — Progress Notes (Signed)
Patient left prior to being seen  CC noted. Will have assistant call. I am happy place order for XR at imaging facility and can then attempt virtual visit with them tomorrow if that would be ok.

## 2019-05-02 ENCOUNTER — Telehealth: Payer: Self-pay | Admitting: Family Medicine

## 2019-05-02 NOTE — Telephone Encounter (Signed)
Pt wife called to say that they will call to schedule appointment with Dr.Greene after seeing the specialist

## 2019-05-07 DIAGNOSIS — R8279 Other abnormal findings on microbiological examination of urine: Secondary | ICD-10-CM | POA: Diagnosis not present

## 2019-05-15 ENCOUNTER — Other Ambulatory Visit: Payer: Self-pay

## 2019-05-15 ENCOUNTER — Ambulatory Visit (HOSPITAL_BASED_OUTPATIENT_CLINIC_OR_DEPARTMENT_OTHER): Payer: Medicare Other

## 2019-05-15 ENCOUNTER — Ambulatory Visit (INDEPENDENT_AMBULATORY_CARE_PROVIDER_SITE_OTHER): Payer: Medicare Other | Admitting: Family Medicine

## 2019-05-15 ENCOUNTER — Encounter: Payer: Self-pay | Admitting: Family Medicine

## 2019-05-15 VITALS — BP 124/82 | HR 63 | Temp 97.9°F | Ht 74.0 in | Wt 173.0 lb

## 2019-05-15 DIAGNOSIS — K5903 Drug induced constipation: Secondary | ICD-10-CM | POA: Diagnosis not present

## 2019-05-15 DIAGNOSIS — M25552 Pain in left hip: Secondary | ICD-10-CM | POA: Diagnosis not present

## 2019-05-15 DIAGNOSIS — R350 Frequency of micturition: Secondary | ICD-10-CM | POA: Diagnosis not present

## 2019-05-15 DIAGNOSIS — R1032 Left lower quadrant pain: Secondary | ICD-10-CM | POA: Diagnosis not present

## 2019-05-15 DIAGNOSIS — R8281 Pyuria: Secondary | ICD-10-CM | POA: Diagnosis not present

## 2019-05-15 LAB — POCT URINALYSIS DIP (MANUAL ENTRY)
Bilirubin, UA: NEGATIVE
Glucose, UA: NEGATIVE mg/dL
Ketones, POC UA: NEGATIVE mg/dL
Nitrite, UA: NEGATIVE
Protein Ur, POC: NEGATIVE mg/dL
Spec Grav, UA: 1.025 (ref 1.010–1.025)
Urobilinogen, UA: 0.2 E.U./dL
pH, UA: 5.5 (ref 5.0–8.0)

## 2019-05-15 LAB — POC MICROSCOPIC URINALYSIS (UMFC): Mucus: ABSENT

## 2019-05-15 NOTE — Patient Instructions (Addendum)
  Restart miralax for constipation.  Depending on results of xray and ultrasound, can decide next step. Return to the clinic or go to the nearest emergency room if any of your symptoms worsen or new symptoms occur.  COVID-19 Vaccine Information can be found at: [email or web address] For questions related to vaccine distribution or appointments, please email [email or web address] or call [phone].   If you have lab work done today you will be contacted with your lab results within the next 2 weeks.  If you have not heard from [place] then please contact us. The fastest way to get your results is to register for My Chart.   IF you received an x-ray today, you will receive an invoice from [facility]. Please contact [facility] at [phone] with questions or concerns regarding your invoice.   IF you received labwork today, you will receive an invoice from [place]. Please contact [facility] at [phone] with questions or concerns regarding your invoice.   Our billing staff will not be able to assist you with questions regarding bills from these companies.  You will be contacted with the lab results as soon as they are available. The fastest way to get your results is to activate your My Chart account. Instructions are located on the last page of this paperwork. If you have not heard from [place] regarding the results in 2 weeks, please contact this office.

## 2019-05-15 NOTE — Progress Notes (Signed)
Subjective:  Patient ID: Cory Johnson, male    DOB: [birth year]  Age: 64 y.o. MRN: [id number]  CC:  Chief Complaint  Patient presents with  . Hip Pain    started 2-3 weeks ago. No trama to the area, pt states he just woke up one morning with this pain. pain is locate on the Front of pt's hip no radiations of pain.pain is sharp and throbing. pain comes and gos. pain level 8/10.    HPI Cory Johnson presents for   Left hip pain: No known injury, started when he woke up 1 morning about 3 weeks ago - pain in the crease/front of the right hip without radiation.  Sharp/throbbing sensation with pain off and on.  No bulge/swelling in affected area.  No falls. Some pain with weightbearing on the left leg.  Hurts to bend - uses hand to assist lifting up that leg. Pain worse than chronic back and neck pain. No change in amount of pain past few weeks.  Relaxing leg - less sore. Walking or motion makes it worse or early in the morning. No radiation to lower leg, buttock, or back.  Some increased urination - new meds few weeks ago from urology has helped. No dysuria, no hematuria, no fever.  Some constipation with his pain meds - last BM 1 week ago. Has used miralax in past - none in past week.   Similar to R sided symptoms in November - [place] negative for hernia, area of fullness corresponded to R common femoral vein - normal.    Tx: hydrocodone 10/325 4 times per day for back and neck. Helping hip some.    History Patient Active Problem List   Diagnosis Date Noted  . Lewy body dementia ([place]) 01/01/2019  . UTI (urinary tract infection) 09/10/2018  . Sepsis ([place]) 09/10/2018  . Acute metabolic encephalopathy 56/21/3086  . Thrombocytopenia ([place]) 09/10/2018  . Total bilirubin, elevated 09/10/2018  . Cerebellar ataxia in diseases classified elsewhere ([place]) 09/17/2017  . REM sleep behavior disorder 09/17/2017  . Chronic pain syndrome 05/21/2017  . Tremor 05/08/2017  . Gait abnormality  05/08/2017  . Transient hypotension 05/05/2017  . Contracture of joint of finger 04/16/2017  . Long-term current use of opiate analgesic 04/02/2017  . Malnutrition of moderate degree 03/28/2017  . Syncope and collapse 03/26/2017  . Closed fracture of shaft of left humerus 03/19/2017  . Subungual hematoma of great toe of right foot 12/13/2016  . Pain of toe of right foot 12/13/2016  . Paresthesia 06/29/2016  . Tremor of right hand 06/29/2016  . Mild cognitive impairment 06/29/2016  . Tremors of nervous system 06/22/2016  . Drooling 06/22/2016  . Loss of weight 06/22/2016  . Family history of throat cancer 06/22/2016  . Abnormality of gait 06/09/2016  . Acute bronchitis 03/20/2016  . Memory loss 12/31/2013  . Back pain 08/18/2013  . Arthritis   . Myoclonic jerking   . Right inguinal hernia 03/17/2011  . Umbilical hernia 57/84/6962  . Acute pulmonary embolus ([place]) 11/07/2009  . DVT of lower extremity (deep venous thrombosis) ([place]) 11/07/2009   Past Medical History:  Diagnosis Date  . Acute pulmonary embolus ([place]) 11/07/2009   pt denies  . Arthritis    neck - limited range of motion; back, shoulders  . Chronic back pain   . Difficult intubation   . DVT of lower extremity (deep venous thrombosis) ([place]) 11/07/2009  . Occasional tremors 03/2017   HANDS   . Right inguinal  hernia 02/2011  . Seizures ([facility])    pt denies  . Syncope and collapse 03/26/2017  . Umbilical hernia 02/2011   Past Surgical History:  Procedure Laterality Date  . CLOSED REDUCTION FINGER WITH PERCUTANEOUS PINNING Left 06/26/2018   Procedure: CLOSED REDUCTION FINGER WITH PERCUTANEOUS PINNING;  Surgeon: Ernest Mallick, MD;  Location: [facility];  Service: Orthopedics;  Laterality: Left;  . HERNIA REPAIR    . I & D EXTREMITY Left 06/26/2018   Procedure: Left Index finger wound exploration, irrigation and debridement, fracture fixation, flexor tendon repair, digital nerve repair with  conduit as needed and surgery as indicated;  Surgeon: Ernest Mallick, MD;  Location: [facility];  Service: Orthopedics;  Laterality: Left;   . INGUINAL HERNIA REPAIR  03/17/2011   Procedure: HERNIA REPAIR INGUINAL ADULT;  Surgeon: Ernestene Mention, MD;  Location: [facility];  Service: General;  Laterality: Right;  right inguinal hernia repair with mesh  . KNEE SURGERY  1977   left  . NERVE AND TENDON REPAIR Left 06/26/2018   Procedure: NERVE AND TENDON REPAIR;  Surgeon: Ernest Mallick, MD;  Location: [facility];  Service: Orthopedics;  Laterality: Left;  . UMBILICAL HERNIA REPAIR  03/17/2011   Procedure: HERNIA REPAIR UMBILICAL ADULT;  Surgeon: Ernestene Mention, MD;  Location: [facility];  Service: General;  Laterality: N/A;  umbilical hernia repair with mesh   No Known Allergies Prior to Admission medications   Medication Sig Start Date End Date Taking? Authorizing Provider  ciprofloxacin (CIPRO) 500 MG tablet Take 500 mg by mouth 2 (two) times daily. 05/09/19  Yes [provider]  HYDROcodone-acetaminophen (NORCO) 10-325 MG tablet Take 1 tablet by mouth 5 (five) times daily as needed for moderate pain. 08/02/18  Yes [provider]  vitamin B-12 (CYANOCOBALAMIN) 500 MCG tablet Take 1 tablet (500 mcg total) by mouth daily. 09/13/18  Yes Erick Blinks, MD  Carbidopa-Levodopa ER (SINEMET CR) 25-100 MG tablet controlled release Take by mouth. 09/18/18 10/02/19  [provider]  fexofenadine (ALLEGRA) 180 MG tablet fexofenadine 180 mg tablet  TK 1 T PO  QID    [provider]   Social History   Socioeconomic History  . Marital status: Married    Spouse name: [name]  . Number of children: 5  . Years of education: college  . Highest education level: Not on file  Occupational History  . Occupation: DIRECTOR    Employer: [employer]  Tobacco Use  . Smoking status: Never  Smoker  . Smokeless tobacco: Never Used  Substance and Sexual Activity  . Alcohol use: No  . Drug use: No  . Sexual activity: Yes    Birth control/protection: Surgical  Other Topics Concern  . Not on file  Social History Narrative   Patient lives at home with his wife [name].   Disabled.   Education college.   Right handed.   Caffeine one cup of coffee daily.   Social Determinants of Health   Financial Resource Strain:   . Difficulty of Paying Living Expenses: Not on file  Food Insecurity:   . Worried About Programme researcher, broadcasting/film/video in the Last Year: Not on file  . Ran Out of Food in the Last Year: Not on file  Transportation Needs:   . Lack of Transportation (Medical): Not on file  . Lack of Transportation (Non-Medical): Not on file  Physical Activity:   .  Days of Exercise per Week: Not on file  . Minutes of Exercise per Session: Not on file  Stress:   . Feeling of Stress : Not on file  Social Connections:   . Frequency of Communication with Friends and Family: Not on file  . Frequency of Social Gatherings with Friends and Family: Not on file  . Attends Religious Services: Not on file  . Active Member of Clubs or Organizations: Not on file  . Attends Banker Meetings: Not on file  . Marital Status: Not on file  Intimate Partner Violence:   . Fear of Current or Ex-Partner: Not on file  . Emotionally Abused: Not on file  . Physically Abused: Not on file  . Sexually Abused: Not on file    Review of Systems  Per HPI.  Objective:   Vitals:   05/15/19 1410  BP: 124/82  Pulse: 63  Temp: 97.9 F (36.6 C)  TempSrc: Temporal  SpO2: 97%  Weight: 173 lb (78.5 kg)  Height: 6\' 2"  (1.88 m)     Physical Exam Constitutional:      General: He is not in acute distress.    Appearance: He is well-developed.  HENT:     Head: Normocephalic and atraumatic.  Cardiovascular:     Rate and Rhythm: Normal rate.  Pulmonary:     Effort: Pulmonary effort is normal.    Abdominal:     Tenderness: There is abdominal tenderness (min suprapubic to LLQ/pelvis. no rebound/guarding. ).     Hernia: There is no hernia in the left inguinal area.  Genitourinary:    Testes:        Right: Mass, tenderness or swelling not present.        Left: Mass, tenderness or swelling not present.     Epididymis:     Right: No tenderness.     Left: No tenderness.    Musculoskeletal:       Legs:  Lymphadenopathy:     Lower Body: No left inguinal adenopathy.  Skin:    General: Skin is warm.     Findings: No erythema or rash.  Neurological:     Mental Status: He is alert and oriented to person, place, and time.    Results for orders placed or performed in visit on 05/15/19  POCT urinalysis dipstick  Result Value Ref Range   Color, UA yellow yellow   Clarity, UA clear clear   Glucose, UA negative negative mg/dL   Bilirubin, UA negative negative   Ketones, POC UA negative negative mg/dL   Spec Grav, UA 07/15/19 3.893 - 1.025   Blood, UA trace-lysed (A) negative   pH, UA 5.5 5.0 - 8.0   Protein Ur, POC negative negative mg/dL   Urobilinogen, UA 0.2 0.2 or 1.0 E.U./dL   Nitrite, UA Negative Negative   Leukocytes, UA Small (1+) (A) Negative  POCT Microscopic Urinalysis ([facility])  Result Value Ref Range   WBC,UR,HPF,POC Too numerous to count  (A) None WBC/hpf   RBC,UR,HPF,POC None None RBC/hpf   Bacteria Few (A) None, Too numerous to count   Mucus Absent Absent   Epithelial Cells, UR Per Microscopy None None, Too numerous to count cells/hpf    Results for orders placed or performed in visit on 05/15/19  POCT urinalysis dipstick  Result Value Ref Range   Color, UA yellow yellow   Clarity, UA clear clear   Glucose, UA negative negative mg/dL   Bilirubin, UA negative negative   Ketones, POC  UA negative negative mg/dL   Spec Grav, UA 0.929 5.747 - 1.025   Blood, UA trace-lysed (A) negative   pH, UA 5.5 5.0 - 8.0   Protein Ur, POC negative negative mg/dL    Urobilinogen, UA 0.2 0.2 or 1.0 E.U./dL   Nitrite, UA Negative Negative   Leukocytes, UA Small (1+) (A) Negative  POCT Microscopic Urinalysis ([facility])  Result Value Ref Range   WBC,UR,HPF,POC Too numerous to count  (A) None WBC/hpf   RBC,UR,HPF,POC None None RBC/hpf   Bacteria Few (A) None, Too numerous to count   Mucus Absent Absent   Epithelial Cells, UR Per Microscopy None None, Too numerous to count cells/hpf   .DG HIP UNILAT W OR W/O PELVIS 2-3 VIEWS LEFT  Result Date: 05/16/2019 CLINICAL DATA:  Left anterior hip pain.  No injury. EXAM: DG HIP (WITH OR WITHOUT PELVIS) 2-3V LEFT COMPARISON:  Lumbar spine 09/17/2017. FINDINGS: Degenerative changes both hips. No evidence of fracture, dislocation, or separation. Left hip is intact. Pelvic calcifications consistent phleboliths. IMPRESSION: No acute bony abnormality identified. Electronically Signed   By: Maisie Fus  Register   On: 05/16/2019 14:51     Assessment & Plan:  KAULDER ZAHNER is a 64 y.o. male . Left groin pain - Plan: US Pelvis Limited, CANCELED: US Pelvis Limited Left hip pain - Plan: DG HIP UNILAT W OR W/O PELVIS 2-3 VIEWS LEFT  -X-ray reassuring, does appear to have degenerative changes but no fracture.  Potentially could have some radiating pain from recent urinary infection, as well as constipation.  Ultrasound also ordered.  Initially symptomatic care recommended with RTC precautions.  Potentially may need orthopedic evaluation  Drug-induced constipation  -Restart MiraLAX.  Ongoing bowel regimen likely will be needed with his use of narcotics.  RTC precautions  Pyuria  Urinary frequency - Plan: POCT urinalysis dipstick, POCT Microscopic Urinalysis ([facility])  -On further discussion after visit, he has been on ciprofloxacin that was recently started by urology.  Symptoms have improved.  Urine culture obtained but no new medication at this time   No orders of the defined types were placed in this encounter.  Patient  Instructions    Restart miralax for constipation.  Depending on results of xray and ultrasound, can decide next step. Return to the clinic or go to the nearest emergency room if any of your symptoms worsen or new symptoms occur.  COVID-19 Vaccine Information can be found at: [email or web address] For questions related to vaccine distribution or appointments, please email [email or web address] or call [phone].   If you have lab work done today you will be contacted with your lab results within the next 2 weeks.  If you have not heard from [place] then please contact us. The fastest way to get your results is to register for My Chart.   IF you received an x-ray today, you will receive an invoice from [facility]. Please contact [facility] at [phone] with questions or concerns regarding your invoice.   IF you received labwork today, you will receive an invoice from [place]. Please contact [facility] at [phone] with questions or concerns regarding your invoice.   Our billing staff will not be able to assist you with questions regarding bills from these companies.  You will be contacted with the lab results as soon as they are available. The fastest way to get your results is to activate your My Chart account. Instructions are located on the last page of this paperwork. If you have not heard from [place] regarding  the results in 2 weeks, please contact this office.         Signed, [name], MD [facility]

## 2019-05-16 ENCOUNTER — Ambulatory Visit
Admission: RE | Admit: 2019-05-16 | Discharge: 2019-05-16 | Disposition: A | Discharge status: Home / Self Care | Payer: Medicare Other | Source: Ambulatory Visit | Attending: Family Medicine | Admitting: Family Medicine

## 2019-05-16 DIAGNOSIS — R8281 Pyuria: Secondary | ICD-10-CM | POA: Diagnosis not present

## 2019-05-16 DIAGNOSIS — R350 Frequency of micturition: Secondary | ICD-10-CM | POA: Diagnosis not present

## 2019-05-16 DIAGNOSIS — M25552 Pain in left hip: Secondary | ICD-10-CM

## 2019-05-17 ENCOUNTER — Encounter: Payer: Self-pay | Admitting: Family Medicine

## 2019-05-17 LAB — URINE CULTURE: Organism ID, Bacteria: NO GROWTH

## 2019-05-19 DIAGNOSIS — G894 Chronic pain syndrome: Secondary | ICD-10-CM | POA: Diagnosis not present

## 2019-06-06 ENCOUNTER — Ambulatory Visit: Payer: Medicare Other | Attending: Internal Medicine

## 2019-06-06 DIAGNOSIS — Z23 Encounter for immunization: Secondary | ICD-10-CM

## 2019-06-06 NOTE — Progress Notes (Signed)
   Covid-19 Vaccination Clinic  Name:  Cory Johnson    MRN: [id number] DOB: [birth year]  06/06/2019  Cory Johnson was observed post Covid-19 immunization for 15 minutes without incident. He was provided with Vaccine Information Sheet and instruction to access the V-Safe system.   Cory Johnson was instructed to call 911 with any severe reactions post vaccine: Marland Kitchen Difficulty breathing  . Swelling of face and throat  . A fast heartbeat  . A bad rash all over body  . Dizziness and weakness   Immunizations Administered    Name Date Dose VIS Date Route   Pfizer COVID-19 Vaccine 06/06/2019  1:50 PM 0.3 mL 02/21/2019 Intramuscular   Manufacturer: [employer], [employer]   Lot: [id number]   NDC: [address]-3

## 2019-06-11 DIAGNOSIS — R3915 Urgency of urination: Secondary | ICD-10-CM | POA: Diagnosis not present

## 2019-06-24 ENCOUNTER — Ambulatory Visit: Payer: Medicare Other | Admitting: Neurology

## 2019-07-02 ENCOUNTER — Ambulatory Visit: Payer: Medicare Other | Attending: Internal Medicine

## 2019-07-02 DIAGNOSIS — Z23 Encounter for immunization: Secondary | ICD-10-CM

## 2019-07-02 NOTE — Progress Notes (Signed)
   Covid-19 Vaccination Clinic  Name:  Cory Johnson    MRN: [id number] DOB: 1955-07-08  07/02/2019  Mr. Cory Johnson was observed post Covid-19 immunization for 15 minutes without incident. He was provided with Vaccine Information Sheet and instruction to access the V-Safe system.   Mr. Cory Johnson was instructed to call 911 with any severe reactions post vaccine: Marland Kitchen Difficulty breathing  . Swelling of face and throat  . A fast heartbeat  . A bad rash all over body  . Dizziness and weakness   Immunizations Administered    Name Date Dose VIS Date Route   Pfizer COVID-19 Vaccine 07/02/2019  1:40 PM 0.3 mL 05/07/2018 Intramuscular   Manufacturer: [employer], [employer]   Lot: [id number]   NDC: [address]-9

## 2019-08-20 DIAGNOSIS — G250 Essential tremor: Secondary | ICD-10-CM | POA: Diagnosis not present

## 2019-08-20 DIAGNOSIS — G20 Parkinson's disease: Secondary | ICD-10-CM | POA: Diagnosis not present

## 2019-08-20 DIAGNOSIS — K117 Disturbances of salivary secretion: Secondary | ICD-10-CM | POA: Diagnosis not present

## 2019-08-20 DIAGNOSIS — T428X5A Adverse effect of antiparkinsonism drugs and other central muscle-tone depressants, initial encounter: Secondary | ICD-10-CM | POA: Diagnosis not present

## 2019-09-23 DIAGNOSIS — G894 Chronic pain syndrome: Secondary | ICD-10-CM | POA: Diagnosis not present

## 2019-10-21 ENCOUNTER — Encounter: Payer: Self-pay | Admitting: Neurology

## 2019-10-21 ENCOUNTER — Ambulatory Visit: Payer: Medicare Other | Admitting: Neurology

## 2019-10-21 VITALS — BP 142/84 | HR 62 | Ht 74.0 in | Wt 165.3 lb

## 2019-10-21 DIAGNOSIS — F0391 Unspecified dementia with behavioral disturbance: Secondary | ICD-10-CM | POA: Diagnosis not present

## 2019-10-21 DIAGNOSIS — G259 Extrapyramidal and movement disorder, unspecified: Secondary | ICD-10-CM | POA: Diagnosis not present

## 2019-10-21 DIAGNOSIS — F03918 Unspecified dementia, unspecified severity, with other behavioral disturbance: Secondary | ICD-10-CM | POA: Insufficient documentation

## 2019-10-21 MED ORDER — CARBIDOPA-LEVODOPA ER 25-100 MG PO TBCR: 1.0000 | EXTENDED_RELEASE_TABLET | Freq: Three times a day (TID) | ORAL | 11 refills | Status: DC

## 2019-10-21 NOTE — Progress Notes (Signed)
PATIENT: Cory Johnson DOB: 1955/05/27  Chief Complaint  Patient presents with  . Follow-up    New rm with wife- Here to re-establish care for tremor. Pt has been seeing Dr. Darlyne Russian but due to drive wanted to come back here. He has been placed on Carb/Levo 25-100 mg 1 tab bid. tremor is mostly on the right side.      HISTORICAL  Cory Johnson is a 64 years old right-handed male, accompanied by his wife to follow-up for memory loss, history of REM sleep disorder,  He had a history of cervical fusion, due to advanced degenerative disc disease, presenting with progressive anterocollis, severe limited range of motion of his neck muscles  I saw him previously for  frequent nocturnal jerking movements.  He described that he had frequent transient jerking movements of his body while he was falling into sleep, he was able to quickly drifting back asleep again.   He also also has excessive movement  during the night sleep, kicking, moving his limbs, occasionally talking, cause a lot of complains from his wife.  he often dreamed of playing basketball.   He denied excessive snoring, or stop breathing during the the snoring.  He also complain excessive daytime fatigue, especially during the evening time, he would easily doze off sitting at the  meetings or watching TV.    He had sleep study in October 2013, which indeed demonstrate REM sleep disorder. There is no sleep apnea.  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 CT scan of the brain was normal.  He continued to complains of neck pain, REM sleep disorder, frequent awakening during nighttime because of the neck pain, he is exercising regularly  Update June 29 2016, Last clinical visit was with Hoyle Sauer on June 09 2016, he noticed worsening memory loss, he had sudden worsening right hand tremor since March 2018, only mild left hand tremor, he denies significant gait abnormality, he does notice mild difficulties with smell, he still acts out of dreams, he is no longer taking clonazepam, complains of nauseous with clonazepam, he feels coldness in his fingers and toes,   He has lost weight in past few months, is 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,  He fell broke his left humeral in May 2018, did not have surgery,  We reviewed laboratory evaluation in April 2018, normal thyroid functional tests, CPK, ESR, B12, RPR, with mild elevated C reactive protein 14.2,  He continues to complaining worsening bilateral hands tremor right worse than left, dragging his foot across the floor, chronic  constipation, but is on chronic narcotics treatment for his low back pain, neck pain, continue have REM sleep disorder, poor sleep, poor sense of smell, denied dizziness when getting up quickly,  UPDATE May 08 2017: He has significant bilateral hands tremor, right worse than left, but denies loss sense of smell, continue to have mild memory loss, mild gait abnormality due to his  neck flexion.  UPDATE September 17 2017: He was referred back by his primary care Dr. Carlota Raspberry for evaluation of weight loss, about 20 pounds since January 2019 by reviewing the chart,  Patient exercise daily, has mildly decreased appetite, frequent awakening at nighttime using bathroom, 1 reason  that they are more concerned about his weight is due to because he got schedule as Christmas gift, he has been taking his weight regularly. He actually had bigger weight fluctuation between May to October 2018, based on record.  He continue complains of bilateral hand shaking, denies significant gait abnormality, mild low back pain  Laboratory evaluation on August 30, 2017 showed normal CMP, prealbumin of 16, ESR of 5, C-reactive protein of 15, elevated PSA of 6.9, normal lipid profile, LDL of 86, normal CMP, TSH    UPDATE Sept 19 2019: He continues to have significant weight loss, he eats well, sleep well, continue have significant bilateral hands tremor.   Personally reviewed MRI brain in August 2019 from outside hospital showed mild generalized atrophy, small vessel disease.  For his tremor, we have tried Inderal without helping, previously tried to Sinemet and without significant improvement  Wife also emailed [place] earlier, complains that patient feel frustrated easily,    UPDATE Augus 10 2021: He was seen by movement specialist at [facility] Dr. Deboraha Sprang since July 2020, most recent office visit was in June 2021, will continue follow-up with Dr. Deboraha Sprang on a yearly basis  I personally reviewed multiple notes from Dr. Deboraha Sprang, history of parkinsonism, visual hallucinations, REM sleep disorder,confirmed by sleep study, neck drop secondary to degenerative cervical disease, mild cognitive impairment, MOCA 19/30 on August 20 2019, primarily affection attention, visual-spatial domains.   Worsening visual hallucinations on a very low dose of Sinemet, teenager talking with him, GI intolerance to higher dose,  he did improve from motor standpoint with Sinemet, UPDRS score improved from 46 to 34,    He is now taking Sinmet 25/100 twice a day now, he can sleeps better, stills dreams a lots,  Wife concerned about his decreased appetite, poor sleep quality, woke up multiple times at night, mildly unsteady gait, but not fall yet.  REVIEW OF SYSTEMS: Full 14 system review of systems performed and notable only for   All rest review of systems were negative.  ALLERGIES: No Known Allergies  HOME MEDICATIONS: Current Outpatient Medications  Medication Sig Dispense Refill  . Carbidopa-Levodopa ER (SINEMET CR) 25-100 MG tablet controlled release Take one tablet daily for 7 days. Then, increase to 1 tablet twice daily    . HYDROcodone-acetaminophen (NORCO) 10-325 MG tablet Take 1 tablet by mouth 5 (five) times daily as needed for moderate pain.    . vitamin B-12 (CYANOCOBALAMIN) 500 MCG tablet Take 1 tablet (500 mcg total) by mouth daily. 30 tablet 0   No current facility-administered medications for this visit.    PAST MEDICAL HISTORY: Past Medical History:  Diagnosis Date  . Acute pulmonary embolus ([place]) 11/07/2009   pt denies  . Arthritis    neck - limited range of motion; back, shoulders  . Chronic back pain   .  Difficult intubation   . DVT of lower extremity (deep venous thrombosis) ([place]) 11/07/2009  . Occasional tremors 03/2017   HANDS   . Right inguinal hernia 02/2011  . Seizures ([place])    pt denies  . Syncope and collapse 03/26/2017  . Umbilical hernia 40/3474    PAST SURGICAL HISTORY: Past Surgical History:  Procedure Laterality Date  . CLOSED REDUCTION FINGER WITH PERCUTANEOUS PINNING Left 06/26/2018   Procedure: CLOSED REDUCTION FINGER WITH PERCUTANEOUS PINNING;  Surgeon: Verner Mould, MD;  Location: [facility];  Service: Orthopedics;  Laterality: Left;  . HERNIA REPAIR    . I & D EXTREMITY Left 06/26/2018   Procedure: Left Index finger wound exploration,  irrigation and debridement, fracture fixation, flexor tendon repair, digital nerve repair with conduit as needed and surgery as indicated;  Surgeon: Verner Mould, MD;  Location: [facility];  Service: Orthopedics;  Laterality: Left;  33mn  . INGUINAL HERNIA REPAIR  03/17/2011   Procedure: HERNIA REPAIR INGUINAL ADULT;  Surgeon: HAdin Hector MD;  [place] [facility]  Service: General;  Laterality: Right;  right inguinal hernia repair with mesh  . [place]  left  . NERVE AND TENDON REPAIR Left 06/26/2018   Procedure: NERVE AND TENDON REPAIR;  Surgeon: CVerner Mould MD;  [place] [facility]  Service: Orthopedics;  Laterality: Left;  . UMBILICAL HERNIA REPAIR  03/17/2011   Procedure: HERNIA REPAIR UMBILICAL ADULT;  Surgeon: HAdin Hector MD;  [place] [facility]  Service: General;  Laterality: N/A;  umbilical hernia repair with mesh    FAMILY HISTORY: Family History  Problem Relation Age of Onset  . Hypertension Other     SOCIAL HISTORY:  Social History   Socioeconomic History  . Marital status: Married    Spouse name: WDerwood Kaplan . Number of children: 5  . Years of education: college  . Highest education level: Not on file  Occupational History  . Occupation: DIRECTOR    Employer: [employer]  Tobacco Use  . Smoking status: Never Smoker  . Smokeless tobacco: Never Used  Vaping Use  . Vaping Use: Never used  Substance and Sexual Activity  . Alcohol use: No  . Drug use: No  . Sexual activity: Yes    Birth control/protection: Surgical  Other Topics Concern  . Not on file  Social History Narrative   Patient lives at home with his wife ([name].   Disabled.   Education college.   Right handed.   Caffeine one cup of coffee daily.   Social Determinants of Health   Financial Resource Strain:   . Difficulty of Paying Living Expenses:   Food Insecurity:   . Worried  About RCharity fundraiserin the Last Year:   . RArboriculturistin the Last Year:   Transportation Needs:   . LFilm/video editor(Medical):   .Marland KitchenLack of Transportation (Non-Medical):   Physical Activity:   . Days of Exercise per Week:   . Minutes of Exercise per Session:   Stress:   . Feeling of Stress :   Social Connections:   . Frequency of Communication with Friends and Family:   . Frequency of Social Gatherings with Friends and Family:   . Attends Religious Services:   . Active Member of Clubs or Organizations:   . Attends CArchivistMeetings:   .Marland KitchenMarital Status:  Intimate Partner Violence:   . Fear of Current or Ex-Partner:   . Emotionally Abused:   Marland Kitchen Physically Abused:   . Sexually Abused:      PHYSICAL EXAM   Vitals:   10/21/19 1032  BP: (!) 142/84  Pulse: 62  Weight: 165 lb 5 oz (75 kg)  Height: 6' 2"  (1.88 m)   Not recorded     Body mass index is 21.22 kg/m.  PHYSICAL EXAMNIATION:  Gen: NAD, conversant, well nourised, obese, well groomed                     Cardiovascular: Regular rate rhythm, no peripheral edema, warm, nontender. Eyes: Conjunctivae clear without exudates or hemorrhage Neck: fixed anterior flexion, very limited range of motion Pulmonary: Clear to auscultation bilaterally   NEUROLOGICAL EXAM:  MENTAL STATUS:  Montreal Cognitive Assessment  10/21/2019  Visuospatial/ Executive (0/5) 1  Naming (0/3) 3  Attention: Read list of digits (0/2) 1  Attention: Read list of letters (0/1) 0  Attention: Serial 7 subtraction starting at 100 (0/3) 0  Language: Repeat phrase (0/2) 1  Language : Fluency (0/1) 0  Abstraction (0/2) 2  Delayed Recall (0/5) 2  Orientation (0/6) 5  Total 15    CRANIAL NERVES: CN II:   Pupils are round equal and briskly reactive to light. CN III, IV, VI: extraocular movement are normal. No ptosis. CN V: Facial sensation is intact to pinprick in all 3 divisions bilaterally. CN VII: Face is symmetric  with normal eye closure and smile. CN VIII: Hearing is normal to rubbing fingers CN IX, X:Phonation is normal. CN XI: Shoulder shrug are intact   MOTOR: He has constant large amplitude bilateral hands resting and posturing tremor,   there is no significant rigidity or bradykinesia, he has limited range of motion of his neck muscles with fixed anterior flexion  REFLEXES: Reflexes are hypoactive and symmetric at the biceps, triceps, knees, and ankles. Plantar responses are flexor.  SENSORY: Intact to light touch, pinprick, positional sensation and vibratory sensation are intact in fingers and toes.  COORDINATION: Rapid alternating movements and fine finger movements are intact. There is no dysmetria on finger-to-nose and heel-knee-shin.    GAIT/STANCE: Need push up to get up from seated position, mildly unsteady, moderate stride, significant limitation due to fixed neck anterior flexion  DIAGNOSTIC DATA (LABS, IMAGING, TESTING) - I reviewed patient records, labs, notes, testing and imaging myself where available.   ASSESSMENT AND PLAN  Cory Johnson is a 64 y.o. male   Central nervous system degenerative disorder  Long history of REM sleep disorder, visual hallucinations, sensitive to medications, progressive worsening memory loss, significant action/posturing tremor, mild Parkinsonism  Some improvement with Sinemet  ER 25/100 mg twice daily, movement disorder specialist Dr. Deboraha Sprang, there was documented improvement by UPDRS score, is currently tolerating Sinemet ER 25/100 mg twice a day, will continue titrating up to 3 tablets a day,  Dementia, progressive worsening memory loss,  Fused cervical spine limited range of motion of cervical spine with fixed anterocollis  With evidence of cervical degenerative changes  Continue moderate exercise  Return to clinic in 6 months with Judson Roch, your follow-up with Dr. Deboraha Sprang on a yearly basis  Marcial Pacas, M.D. Ph.D.  [facility] [address] [place], [place] [address] Ph: [phone] Fax: [phone]  CC: Referring Provider

## 2019-10-28 ENCOUNTER — Encounter: Payer: Self-pay | Admitting: Neurology

## 2019-11-24 ENCOUNTER — Other Ambulatory Visit: Payer: Self-pay

## 2019-11-24 ENCOUNTER — Encounter: Payer: Self-pay | Admitting: Podiatry

## 2019-11-24 ENCOUNTER — Ambulatory Visit: Payer: Medicare Other | Admitting: Podiatry

## 2019-11-24 DIAGNOSIS — L600 Ingrowing nail: Secondary | ICD-10-CM | POA: Diagnosis not present

## 2019-11-27 NOTE — Progress Notes (Signed)
Subjective:   Patient ID: Cory Johnson, male   DOB: 64 y.o.   MRN: [id number]   HPI Patient presents stating his big toenail right is very thickened and it is painful and it is making it difficult for him to wear shoe gear comfortably.  He would like to have it removed and he wants to learn what the healing process will be today.  Patient does not smoke likes to be active   Review of Systems  All other systems reviewed and are negative.       Objective:  Physical Exam Vitals and nursing note reviewed.  Constitutional:      Appearance: He is well-developed.  Pulmonary:     Effort: Pulmonary effort is normal.  Musculoskeletal:        General: Normal range of motion.  Skin:    General: Skin is warm.  Neurological:     Mental Status: He is alert.     Neurovascular status intact muscle strength found to be adequate range of motion was adequate.  Patient is found to have a damaged thickened right hallux nail that is dystrophic and painful when pressed patient has good digital perfusion well oriented x3     Assessment:  Chronic damaged right hallux nail long-term nature with pain     Plan:  H&P reviewed condition recommended nail removal patient wants this done but needs to hold off currently due to the healing process.  Scheduled in 9 days for this to be done

## 2019-12-01 ENCOUNTER — Ambulatory Visit: Payer: Medicare Other | Admitting: Podiatry

## 2019-12-03 ENCOUNTER — Other Ambulatory Visit: Payer: Self-pay

## 2019-12-03 ENCOUNTER — Ambulatory Visit: Payer: Medicare Other | Admitting: Podiatry

## 2019-12-03 DIAGNOSIS — B351 Tinea unguium: Secondary | ICD-10-CM

## 2019-12-03 DIAGNOSIS — L603 Nail dystrophy: Secondary | ICD-10-CM

## 2019-12-03 DIAGNOSIS — M79674 Pain in right toe(s): Secondary | ICD-10-CM

## 2019-12-03 NOTE — Patient Instructions (Signed)

## 2019-12-03 NOTE — Progress Notes (Signed)
   Subjective: Patient presents today for evaluation of a painful dystrophic onychomycotic nail to the right hallux nail plate.  He was last seen in the office on 11/24/2019 and scheduled to return here in the office in 10 days for total permanent nail matricectomy.  He would like to have the nail removed permanently.  He presents for further treatment and evaluation Past Medical History:  Diagnosis Date  . Acute pulmonary embolus ([facility]) 11/07/2009   pt denies  . Arthritis    neck - limited range of motion; back, shoulders  . Chronic back pain   . Difficult intubation   . DVT of lower extremity (deep venous thrombosis) ([facility]) 11/07/2009  . Occasional tremors 03/2017   HANDS   . Right inguinal hernia 02/2011  . Seizures ([facility])    pt denies  . Syncope and collapse 03/26/2017  . Umbilical hernia 02/2011    Objective:  General: Well developed, nourished, in no acute distress, alert and oriented x3   Dermatology: Skin is warm, dry and supple bilateral.  Right hallux nail plate appears thickened and dystrophic with discoloration with pain on palpation.  The remaining nails appear unremarkable at this time. There are no open sores, lesions.  Vascular: Dorsalis Pedis artery and Posterior Tibial artery pedal pulses palpable. No lower extremity edema noted.   Neruologic: Grossly intact via light touch bilateral.  Musculoskeletal: Muscular strength within normal limits in all groups bilateral. Normal range of motion noted to all pedal and ankle joints.   Assesement: #1  Dystrophic nail right hallux #2 pain due to onychomycosis of toenail right hallux  Plan of Care:  1. Patient evaluated.  2. Discussed treatment alternatives and plan of care. Explained nail avulsion procedure and post procedure course to patient. 3. Patient opted for permanent total nail avulsion of the right hallux nail plate.  4. Prior to procedure, local anesthesia infiltration utilized using 3 ml of a 50:50 mixture of 2%  plain lidocaine and 0.5% plain marcaine in a normal hallux block fashion and a betadine prep performed.  5.  Total permanent nail avulsion with chemical matrixectomy performed using 3x30sec applications of phenol followed by alcohol flush.  6. Light dressing applied. 7.  Silvadene cream provided for the patient to apply daily with a Band-Aid  8.  Return to clinic 3 weeks.  Felecia Shelling, DPM [facility]  Dr. Felecia Shelling, DPM    [address]                                        [place], [place] [address]                Office [phone]  Fax [phone]

## 2019-12-10 DIAGNOSIS — J209 Acute bronchitis, unspecified: Secondary | ICD-10-CM | POA: Diagnosis not present

## 2019-12-10 DIAGNOSIS — R05 Cough: Secondary | ICD-10-CM | POA: Diagnosis not present

## 2019-12-18 ENCOUNTER — Ambulatory Visit: Payer: Medicare Other | Admitting: Neurology

## 2019-12-22 ENCOUNTER — Ambulatory Visit (INDEPENDENT_AMBULATORY_CARE_PROVIDER_SITE_OTHER): Payer: Medicare Other | Admitting: Podiatry

## 2019-12-22 ENCOUNTER — Other Ambulatory Visit: Payer: Self-pay

## 2019-12-22 DIAGNOSIS — B351 Tinea unguium: Secondary | ICD-10-CM | POA: Diagnosis not present

## 2019-12-22 DIAGNOSIS — L603 Nail dystrophy: Secondary | ICD-10-CM

## 2019-12-22 DIAGNOSIS — M79674 Pain in right toe(s): Secondary | ICD-10-CM | POA: Diagnosis not present

## 2019-12-22 NOTE — Progress Notes (Signed)
   Subjective: 64 y.o. male presents today status post permanent nail avulsion procedure of the right hallux nail plate that was performed on 12/03/2019.  The patient states that he is doing very well.  He notices a significant improvement in the pain.  He has been applying antibiotic cream daily.  No new complaints at this time  Past Medical History:  Diagnosis Date  . Acute pulmonary embolus ([facility]) 11/07/2009   pt denies  . Arthritis    neck - limited range of motion; back, shoulders  . Chronic back pain   . Difficult intubation   . DVT of lower extremity (deep venous thrombosis) ([facility]) 11/07/2009  . Occasional tremors 03/2017   HANDS   . Right inguinal hernia 02/2011  . Seizures ([facility])    pt denies  . Syncope and collapse 03/26/2017  . Umbilical hernia 02/2011    Objective: Skin is warm, dry and supple. Nail and respective nail bed appears to be healing appropriately. Open wound to the associated nail bed with a granular wound base and moderate amount of fibrotic tissue. Minimal drainage noted. Mild erythema around the periungual region likely due to phenol chemical matricectomy.  Assessment: #1 postop permanent total nail avulsion right hallux nail plate #2 open wound periungual nail fold of respective digit.   Plan of care: #1 patient was evaluated  #2 debridement of open wound was performed to the periungual border of the respective toe using a currette. Antibiotic ointment and Band-Aid was applied. #3 patient is to return to clinic on a PRN basis.   Felecia Shelling, DPM [facility]  Dr. Felecia Shelling, DPM    [address]                                        [place], [place] [address]                Office [phone]  Fax [phone]

## 2019-12-24 ENCOUNTER — Ambulatory Visit: Payer: Medicare Other | Admitting: Podiatry

## 2019-12-24 DIAGNOSIS — R634 Abnormal weight loss: Secondary | ICD-10-CM | POA: Diagnosis not present

## 2019-12-24 DIAGNOSIS — G20 Parkinson's disease: Secondary | ICD-10-CM | POA: Diagnosis not present

## 2019-12-24 DIAGNOSIS — G894 Chronic pain syndrome: Secondary | ICD-10-CM | POA: Diagnosis not present

## 2020-01-09 ENCOUNTER — Ambulatory Visit (INDEPENDENT_AMBULATORY_CARE_PROVIDER_SITE_OTHER): Payer: Medicare Other

## 2020-01-09 ENCOUNTER — Encounter: Payer: Self-pay | Admitting: Family Medicine

## 2020-01-09 ENCOUNTER — Ambulatory Visit (INDEPENDENT_AMBULATORY_CARE_PROVIDER_SITE_OTHER): Payer: Medicare Other | Admitting: Family Medicine

## 2020-01-09 ENCOUNTER — Other Ambulatory Visit: Payer: Self-pay

## 2020-01-09 VITALS — BP 113/77 | HR 70 | Temp 97.9°F | Ht 74.0 in | Wt 163.2 lb

## 2020-01-09 DIAGNOSIS — Z8701 Personal history of pneumonia (recurrent): Secondary | ICD-10-CM

## 2020-01-09 DIAGNOSIS — R059 Cough, unspecified: Secondary | ICD-10-CM

## 2020-01-09 DIAGNOSIS — R972 Elevated prostate specific antigen [PSA]: Secondary | ICD-10-CM | POA: Diagnosis not present

## 2020-01-09 DIAGNOSIS — R634 Abnormal weight loss: Secondary | ICD-10-CM

## 2020-01-09 DIAGNOSIS — G3183 Dementia with Lewy bodies: Secondary | ICD-10-CM | POA: Diagnosis not present

## 2020-01-09 DIAGNOSIS — R202 Paresthesia of skin: Secondary | ICD-10-CM

## 2020-01-09 DIAGNOSIS — R7989 Other specified abnormal findings of blood chemistry: Secondary | ICD-10-CM

## 2020-01-09 DIAGNOSIS — F02818 Dementia in other diseases classified elsewhere, unspecified severity, with other behavioral disturbance: Secondary | ICD-10-CM

## 2020-01-09 DIAGNOSIS — F0281 Dementia in other diseases classified elsewhere with behavioral disturbance: Secondary | ICD-10-CM

## 2020-01-09 MED ORDER — GABAPENTIN 100 MG PO CAPS: 100.0000 mg | ORAL_CAPSULE | Freq: Every day | ORAL | 1 refills | Status: DC

## 2020-01-09 NOTE — Progress Notes (Signed)
Subjective:  Patient ID: Cory Johnson, male    DOB: 07-11-1955  Age: 64 y.o. MRN: [id number]  CC:  Chief Complaint  Patient presents with  . Weight Loss    unexplained weight loss. going down every week   . numbness and tingling on feet    going on a year     HPI CASWELL ALVILLAR presents for   Weight loss Discussed previously including in 2019 with evaluation by neurology.  He does have a history of parkinsonism, followed by movement specialist Dr. Westley Hummer at [facility] since July 2020.  Neurologist locally Dr. Terrace Arabia, most recent visit August 10: Worsening visual hallucinations on low-dose of Sinemet, GI tolerance on higher dose, although he did improve from a motor standpoint with Sinemet with U PDR S score improved from 46-34.  Was taking Sinemet 25/100 twice per day at last visit, sleeping better but still poor sleep quality with multiple episodes of waking at night, and decreased appetite.  Plan for titrating up to 3 tablets/day.  95-month follow-up planned.  Moderate exercise recommended with history of cervical degenerative changes and few cervical spine.  Note reviewed from Dr. Westley Hummer in June: Working diagnosis of probable diffuse Lewy body dementia.  Persistent tremors, continued on Sinemet as long as hallucinations were not intrusive.  Aricept versus Exelon patch discussed.  Option of Nuplazid if hallucinations become an issue.  Mild dementia on Montreal cognitive assessment of 19 out of 30.  15 min initial chart review.   Still with concern of weight loss. 10 pound weight loss since March.  Decreased appetite as above, wife present for visit - about the same portion sizes - small meals 6 times per day with meat, vegetables, potatoes. Normal prealbumin in July 2020.  Colonoscopy in 2018 normal with repeat 10 years.  Elevated PSA in 2019. Seen by urology, most recently in June 2020, BPH with LUTS.  Increasing PSA at that time, urine culture obtained to treat for  possible infection with plan repeat PSA.  Had antibiotics, and thinks had ok repeat PSA. No recent PSA.   No recent tsh - borderline low in 09/2018.   Numbness/tingling in feet past year - both feet On chart review - has taken gabapentin in past (2017/18) for foot dysesthesias. No known side effects.  Possible swelling in left foot/ankle  - noticed 2 days ago. No known recent injury.  No calf pain/swelling. DVT and PE in 2011 - 6 months warfarin.   Lab Results  Component Value Date   PSA1 6.1 (H) 09/17/2017   PSA1 6.9 (H) 05/24/2017   PSA 1.0 01/10/2016   PSA 2.32 12/04/2013   PSA 1.43 08/20/2012    Wt Readings from Last 3 Encounters:  01/09/20 163 lb 3.2 oz (74 kg)  10/21/19 165 lb 5 oz (75 kg)  05/15/19 173 lb (78.5 kg)  July 2020 - 170.    Cough: covid vaccine in March, April. No booster.  Cough noted in mid September - cough all night long, wheezing, short of breath that morning. Hot and cold spells. No measured temp. History provided by spouse Seen by [facility] - had negative covid test. Had CXR - ? Congestion, but difficult to interpret - ?pneumonia. Disoriented. Unable to walk?  Symptoms improved in 2 days.  Treated with antibiotic for about a week. Cough improved in few days. No recent fever or cough.  No recent dyspnea, shortness of breath.    History Patient Active Problem List   Diagnosis Date  Noted  . Movement disorder 10/21/2019  . Dementia with behavioral disturbance (HCC) 10/21/2019  . Lewy body dementia (HCC) 01/01/2019  . UTI (urinary tract infection) 09/10/2018  . Sepsis (HCC) 09/10/2018  . Acute metabolic encephalopathy 09/10/2018  . Thrombocytopenia (HCC) 09/10/2018  . Total bilirubin, elevated 09/10/2018  . 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 REM sleep behavior disorder 09/17/2017  . Chronic pain syndrome 05/21/2017  . Tremor 05/08/2017  . Gait abnormality 05/08/2017  . Transient hypotension 05/05/2017  .  Contracture of joint of finger 04/16/2017  . Long-term current use of opiate analgesic 04/02/2017  . Malnutrition of moderate degree 03/28/2017  . Syncope and collapse 03/26/2017  . Closed fracture of shaft of left humerus 03/19/2017  . Subungual hematoma of great toe of right foot 12/13/2016  . Pain of toe of right foot 12/13/2016  . Paresthesia 06/29/2016  . Tremor of right hand 06/29/2016  . Mild cognitive impairment 06/29/2016  . Tremors of nervous system 06/22/2016  . Drooling 06/22/2016  . Loss of weight 06/22/2016  . Family history of throat cancer 06/22/2016  . Abnormality of gait 06/09/2016  . Acute bronchitis 03/20/2016  . Memory loss 12/31/2013  . Back pain 08/18/2013  . Arthritis   . Myoclonic jerking   . Right inguinal hernia 03/17/2011  . Umbilical hernia 03/17/2011  . Acute pulmonary embolus (HCC) 11/07/2009  . DVT of lower extremity (deep venous thrombosis) (HCC) 11/07/2009   Past Medical History:  Diagnosis Date  . Acute pulmonary embolus (HCC) 11/07/2009   pt denies  . Arthritis    neck - limited range of motion; back, shoulders  . Chronic back pain   . Difficult intubation   . DVT of lower extremity (deep venous thrombosis) (HCC) 11/07/2009  . Occasional tremors 03/2017   HANDS   . Right inguinal hernia 02/2011  . Seizures (HCC)    pt denies  . Syncope and collapse 03/26/2017  . Umbilical hernia 02/2011   Past Surgical History:  Procedure Laterality Date  . CLOSED REDUCTION FINGER WITH PERCUTANEOUS PINNING Left 06/26/2018   Procedure: CLOSED REDUCTION FINGER WITH PERCUTANEOUS PINNING;  Surgeon: Ernest Mallick, MD;  Location: [facility];  Service: Orthopedics;  Laterality: Left;  . HERNIA REPAIR    . I & D EXTREMITY Left 06/26/2018   Procedure: Left Index finger wound exploration, irrigation and debridement, fracture fixation, flexor tendon repair, digital nerve repair with conduit as needed and surgery as indicated;  Surgeon:  Ernest Mallick, MD;  Location: [facility];  Service: Orthopedics;  Laterality: Left;   . INGUINAL HERNIA REPAIR  03/17/2011   Procedure: HERNIA REPAIR INGUINAL ADULT;  Surgeon: Ernestene Mention, MD;  Location: [facility];  Service: General;  Laterality: Right;  right inguinal hernia repair with mesh  . KNEE SURGERY  1977   left  . NERVE AND TENDON REPAIR Left 06/26/2018   Procedure: NERVE AND TENDON REPAIR;  Surgeon: Ernest Mallick, MD;  Location: [facility];  Service: Orthopedics;  Laterality: Left;  . UMBILICAL HERNIA REPAIR  03/17/2011   Procedure: HERNIA REPAIR UMBILICAL ADULT;  Surgeon: Ernestene Mention, MD;  Location: [facility];  Service: General;  Laterality: N/A;  umbilical hernia repair with mesh   No Known Allergies Prior to Admission medications   Medication Sig Start Date End Date Taking? Authorizing Provider  Carbidopa-Levodopa ER (SINEMET CR) 25-100 MG tablet controlled release Take  1 tablet by mouth in the morning, at noon, and at bedtime. 10/21/19  Yes Levert Feinstein, MD  HYDROcodone-acetaminophen (NORCO) 10-325 MG tablet Take 1 tablet by mouth 5 (five) times daily as needed for moderate pain. 08/02/18  Yes [provider]  vitamin B-12 (CYANOCOBALAMIN) 500 MCG tablet Take 1 tablet (500 mcg total) by mouth daily. 09/13/18  Yes Erick Blinks, MD   Social History   Socioeconomic History  . Marital status: Married    Spouse name: [name]  . Number of children: 5  . Years of education: college  . Highest education level: Not on file  Occupational History  . Occupation: DIRECTOR    Employer: [employer]  Tobacco Use  . Smoking status: Never Smoker  . Smokeless tobacco: Never Used  Vaping Use  . Vaping Use: Never used  Substance and Sexual Activity  . Alcohol use: No  . Drug use: No  . Sexual activity: Yes    Birth control/protection: Surgical  Other Topics Concern  . Not on  file  Social History Narrative   Patient lives at home with his wife [name].   Disabled.   Education college.   Right handed.   Caffeine one cup of coffee daily.   Social Determinants of Health   Financial Resource Strain:   . Difficulty of Paying Living Expenses: Not on file  Food Insecurity:   . Worried About Programme researcher, broadcasting/film/video in the Last Year: Not on file  . Ran Out of Food in the Last Year: Not on file  Transportation Needs:   . Lack of Transportation (Medical): Not on file  . Lack of Transportation (Non-Medical): Not on file  Physical Activity:   . Days of Exercise per Week: Not on file  . Minutes of Exercise per Session: Not on file  Stress:   . Feeling of Stress : Not on file  Social Connections:   . Frequency of Communication with Friends and Family: Not on file  . Frequency of Social Gatherings with Friends and Family: Not on file  . Attends Religious Services: Not on file  . Active Member of Clubs or Organizations: Not on file  . Attends Banker Meetings: Not on file  . Marital Status: Not on file  Intimate Partner Violence:   . Fear of Current or Ex-Partner: Not on file  . Emotionally Abused: Not on file  . Physically Abused: Not on file  . Sexually Abused: Not on file    Review of Systems Per hpi.   Objective:   Vitals:   01/09/20 1030  BP: 113/77  Pulse: 70  Temp: 97.9 F (36.6 C)  TempSrc: Temporal  SpO2: 97%  Weight: 163 lb 3.2 oz (74 kg)  Height: 6\' 2"  (1.88 m)     Physical Exam Vitals reviewed.  Constitutional:      Appearance: He is well-developed.  HENT:     Head: Normocephalic and atraumatic.  Eyes:     Pupils: Pupils are equal, round, and reactive to light.  Neck:     Vascular: No carotid bruit or JVD.  Cardiovascular:     Rate and Rhythm: Normal rate and regular rhythm.     Heart sounds: Normal heart sounds. No murmur heard.   Pulmonary:     Effort: Pulmonary effort is normal. No respiratory distress.      Breath sounds: No wheezing, rhonchi or rales.  Abdominal:     General: Abdomen is flat.     Tenderness: There  is no abdominal tenderness.  Musculoskeletal:     Comments: Calves nontender, no calf swelling - circumference equal, negative homans bilaterally.   Skin:    General: Skin is warm and dry.  Neurological:     General: No focal deficit present.     Mental Status: He is alert and oriented to person, place, and time.     Comments: Tremor of R arm - persistent at rest.   Psychiatric:        Mood and Affect: Mood normal.        Behavior: Behavior normal.    DG Chest 2 View  Result Date: 01/09/2020 CLINICAL DATA:  History of pneumonia.  Cough.  Weight loss. EXAM: CHEST - 2 VIEW COMPARISON:  Chest x-ray 09/20/2018. FINDINGS: Mediastinum and hilar structures normal. Right mid lung pleural-parenchymal thickening consistent scarring. No focal infiltrate. No pleural effusion or pneumothorax. Stable mild elevation left hemidiaphragm. Kyphosis and degenerative change thoracic spine again noted. IMPRESSION: Right mid lung pleural-parenchymal thickening consistent with scarring. No acute cardiopulmonary disease identified. Electronically Signed   By: Maisie Fushomas  Register   On: 01/09/2020 12:01   Over 50 minutes spent during visit, greater than 50% counseling and assimilation of information, chart review, and discussion of plan.    Assessment & Plan:  Nicoletta DressKenneth B Krebbs is a 64 y.o. male . Loss of weight - Plan: Comprehensive metabolic panel, TSH + free T4, Prealbumin, CBC Low TSH level - Plan: TSH + free T4 Elevated PSA - Plan: PSA Lewy body dementia with behavioral disturbance (HCC)  -Weight loss could be multifactorial.  With his tremor, and other calorie needs, ensure he is obtaining sufficient calories.  We will check prealbumin, CMP, consider nutritionist eval  -Previous slight/borderline low TSH, repeat testing to rule out hyperthyroidism  -Previous elevated PSA but improved by their  report after a visit with urology  Will check updated PSA, consider urology eval if still elevated  -Recheck 2 weeks  -Continue neurology follow-up for parkinsonism/Lewy body dementia.  Cough - Plan: DG Chest 2 View History of community acquired pneumonia - Plan: DG Chest 2 View, CBC  -Symptoms have resolved, chest x-ray without acute concerns.  Scarring noted.  Paresthesia of both feet - Plan: gabapentin (NEURONTIN) 100 MG capsule  -Longstanding issue, could be neuro impingement with degenerative disc disease, check TSH as above.  Trial of low-dose gabapentin with potential side effects discussed.  Meds ordered this encounter  Medications  . gabapentin (NEURONTIN) 100 MG capsule    Sig: Take 1 capsule (100 mg total) by mouth at bedtime.    Dispense:  30 capsule    Refill:  1   Patient Instructions   Chest x-ray looks okay, if any return of cough or shortness of breath/fever, be seen right away. I will check some other testing for the weight loss.  Follow-up in 2 weeks to discuss those results and further testing at that time, and possible evaluation with nutritionist to making sure you are obtaining adequate calories with current meals. Foot tingling could be due to multiple causes including nerve issue.  He has used gabapentin few years ago, we can try that again but at very low dose.  Watch for new side effects such as sedation.  If you do not tolerate that medication, stop it. Return to the clinic or go to the nearest emergency room if any of your symptoms worsen or new symptoms occur.    If you have lab work done today you will be contacted  with your lab results within the next 2 weeks.  If you have not heard from [place] then please contact us. The fastest way to get your results is to register for My Chart.   IF you received an x-ray today, you will receive an invoice from [facility]. Please contact [facility] at [phone] with questions or concerns regarding  your invoice.   IF you received labwork today, you will receive an invoice from [place]. Please contact [facility] at [phone] with questions or concerns regarding your invoice.   Our billing staff will not be able to assist you with questions regarding bills from these companies.  You will be contacted with the lab results as soon as they are available. The fastest way to get your results is to activate your My Chart account. Instructions are located on the last page of this paperwork. If you have not heard from [place] regarding the results in 2 weeks, please contact this office.          Signed, [name], MD [facility]

## 2020-01-09 NOTE — Patient Instructions (Addendum)
Chest x-ray looks okay, if any return of cough or shortness of breath/fever, be seen right away. I will check some other testing for the weight loss.  Follow-up in 2 weeks to discuss those results and further testing at that time, and possible evaluation with nutritionist to making sure you are obtaining adequate calories with current meals. Foot tingling could be due to multiple causes including nerve issue.  He has used gabapentin few years ago, we can try that again but at very low dose.  Watch for new side effects such as sedation.  If you do not tolerate that medication, stop it. Return to the clinic or go to the nearest emergency room if any of your symptoms worsen or new symptoms occur.  If you have lab work done today you will be contacted with your lab results within the next 2 weeks.  If you have not heard from [place] then please contact us. The fastest way to get your results is to register for My Chart.   IF you received an x-ray today, you will receive an invoice from [facility]. Please contact [facility] at [phone] with questions or concerns regarding your invoice.   IF you received labwork today, you will receive an invoice from [place]. Please contact [facility] at [phone] with questions or concerns regarding your invoice.   Our billing staff will not be able to assist you with questions regarding bills from these companies.  You will be contacted with the lab results as soon as they are available. The fastest way to get your results is to activate your My Chart account. Instructions are located on the last page of this paperwork. If you have not heard from [place] regarding the results in 2 weeks, please contact this office.

## 2020-01-10 LAB — CBC
Hematocrit: 43.3 % (ref 37.5–51.0)
Hemoglobin: 14.1 g/dL (ref 13.0–17.7)
MCH: 29 pg (ref 26.6–33.0)
MCHC: 32.6 g/dL (ref 31.5–35.7)
MCV: 89 fL (ref 79–97)
Platelets: 181 10*3/uL (ref 150–450)
RBC: 4.86 x10E6/uL (ref 4.14–5.80)
RDW: 13.1 % (ref 11.6–15.4)
WBC: 5.1 10*3/uL (ref 3.4–10.8)

## 2020-01-10 LAB — COMPREHENSIVE METABOLIC PANEL
ALT: 7 IU/L (ref 0–44)
AST: 15 IU/L (ref 0–40)
Albumin/Globulin Ratio: 1.6 (ref 1.2–2.2)
Albumin: 4.4 g/dL (ref 3.8–4.8)
Alkaline Phosphatase: 68 IU/L (ref 44–121)
BUN/Creatinine Ratio: 12 (ref 10–24)
BUN: 10 mg/dL (ref 8–27)
Bilirubin Total: 0.6 mg/dL (ref 0.0–1.2)
CO2: 30 mmol/L — ABNORMAL HIGH (ref 20–29)
Calcium: 9.3 mg/dL (ref 8.6–10.2)
Chloride: 101 mmol/L (ref 96–106)
Creatinine, Ser: 0.81 mg/dL (ref 0.76–1.27)
GFR calc Af Amer: 107 mL/min/{1.73_m2} (ref >59)
GFR calc non Af Amer: 93 mL/min/{1.73_m2} (ref >59)
Globulin, Total: 2.8 g/dL (ref 1.5–4.5)
Glucose: 96 mg/dL (ref 65–99)
Potassium: 4.1 mmol/L (ref 3.5–5.2)
Sodium: 140 mmol/L (ref 134–144)
Total Protein: 7.2 g/dL (ref 6.0–8.5)

## 2020-01-10 LAB — TSH+FREE T4
Free T4: 1.21 ng/dL (ref 0.82–1.77)
TSH: 1.21 u[IU]/mL (ref 0.450–4.500)

## 2020-01-10 LAB — PSA: Prostate Specific Ag, Serum: 4 ng/mL (ref 0.0–4.0)

## 2020-01-10 LAB — PREALBUMIN: PREALBUMIN: 15 mg/dL (ref 10–36)

## 2020-01-14 ENCOUNTER — Encounter: Payer: Self-pay | Admitting: Radiology

## 2020-01-23 ENCOUNTER — Telehealth (INDEPENDENT_AMBULATORY_CARE_PROVIDER_SITE_OTHER): Payer: Medicare Other | Admitting: Family Medicine

## 2020-01-23 ENCOUNTER — Telehealth: Payer: Self-pay | Admitting: Family Medicine

## 2020-01-23 ENCOUNTER — Encounter: Payer: Self-pay | Admitting: Family Medicine

## 2020-01-23 ENCOUNTER — Other Ambulatory Visit: Payer: Self-pay

## 2020-01-23 VITALS — Ht 75.0 in | Wt 164.0 lb

## 2020-01-23 DIAGNOSIS — G3183 Dementia with Lewy bodies: Secondary | ICD-10-CM | POA: Diagnosis not present

## 2020-01-23 DIAGNOSIS — Z87898 Personal history of other specified conditions: Secondary | ICD-10-CM

## 2020-01-23 DIAGNOSIS — R634 Abnormal weight loss: Secondary | ICD-10-CM | POA: Diagnosis not present

## 2020-01-23 DIAGNOSIS — F02818 Dementia in other diseases classified elsewhere, unspecified severity, with other behavioral disturbance: Secondary | ICD-10-CM

## 2020-01-23 DIAGNOSIS — R202 Paresthesia of skin: Secondary | ICD-10-CM

## 2020-01-23 DIAGNOSIS — F0281 Dementia in other diseases classified elsewhere with behavioral disturbance: Secondary | ICD-10-CM

## 2020-01-23 NOTE — Progress Notes (Signed)
Virtual Visit via Video Note  I connected with Cory Johnson on 02/02/20 at 12:31 PM by a video enabled telemedicine application and verified that I am speaking with the correct person using two identifiers.  8 min chart review.  Patient location:home  My location: office   I discussed the limitations, risks, security and privacy concerns of performing an evaluation and management service by telephone and the availability of in person appointments. I also discussed with the patient that there may be a patient responsible charge related to this service. The patient expressed understanding and agreed to proceed, consent obtained  Chief complaint:  Chief Complaint  Patient presents with  . Follow-up    on weight loss / labs. Pt reports doing his best with his diet since last OV.      History of Present Illness: Cory Johnson is a 64 y.o. male  Weight loss: Last discussed 2 weeks ago on October 29 visit.  Discussed previously as well.  10 pound weight loss since March.  Decreased appetite, although he states he has been eating multiple times per day just small meals. Colonoscopy in 2018 normal with repeat 10 years, seen by urology for history of elevated PSA, thought to be BPH with LUTS.  Most recent testing lower at 4.0. has not seen urology since June 2020.  Borderline low TSH in 2020.  Normal recently. Previous cough in September, improved within a few days, treated with antibiotic at that time.  Denied recent dyspnea or shortness of breath, or cough at last visit.  CXR on 10/29: IMPRESSION: Right mid lung pleural-parenchymal thickening consistent with scarring. No acute cardiopulmonary disease identified.  No n/v/abd pain.  No alcohol.  Due to back/neck issues- difficulty lying flat, would have difficulty with CT or MRI imaging.   Blood work obtained with upper range normal PSA, normal TSH, CMP normal with normal pre albumin.  CBC normal.  He does have a chronic tremor,  working diagnosis of probable diffuse Lewy body dementia.  Has been continued on Sinemet for his persistent tremors.  No diarrhea/blood in stools. Normal bowel movements.  Hesitation with urination, no blood in urine. No new symptoms.   Home weight 164 today. Weight around 164 -165 since last visit. Feels like losing muscle weight.   Wt Readings from Last 3 Encounters:  01/23/20 164 lb (74.4 kg)  01/09/20 163 lb 3.2 oz (74 kg)  10/21/19 165 lb 5 oz (75 kg)    Lab Results  Component Value Date   PSA1 4.0 01/09/2020   PSA1 6.1 (H) 09/17/2017   PSA1 6.9 (H) 05/24/2017   PSA 1.0 01/10/2016   PSA 2.32 12/04/2013   PSA 1.43 08/20/2012   Results for orders placed or performed in visit on 01/09/20  Comprehensive metabolic panel  Result Value Ref Range   Glucose 96 65 - 99 mg/dL   BUN 10 8 - 27 mg/dL   Creatinine, Ser 0.81 0.76 - 1.27 mg/dL   GFR calc non Af Amer 93 >59 mL/min/1.73   GFR calc Af Amer 107 >59 mL/min/1.73   BUN/Creatinine Ratio 12 10 - 24   Sodium 140 134 - 144 mmol/L   Potassium 4.1 3.5 - 5.2 mmol/L   Chloride 101 96 - 106 mmol/L   CO2 30 (H) 20 - 29 mmol/L   Calcium 9.3 8.6 - 10.2 mg/dL   Total Protein 7.2 6.0 - 8.5 g/dL   Albumin 4.4 3.8 - 4.8 g/dL   Globulin, Total 2.8 1.5 -  4.5 g/dL   Albumin/Globulin Ratio 1.6 1.2 - 2.2   Bilirubin Total 0.6 0.0 - 1.2 mg/dL   Alkaline Phosphatase 68 44 - 121 IU/L   AST 15 0 - 40 IU/L   ALT 7 0 - 44 IU/L  PSA  Result Value Ref Range   Prostate Specific Ag, Serum 4.0 0.0 - 4.0 ng/mL  TSH + free T4  Result Value Ref Range   TSH 1.210 0.450 - 4.500 uIU/mL   Free T4 1.21 0.82 - 1.77 ng/dL  Prealbumin  Result Value Ref Range   PREALBUMIN 15 10 - 36 mg/dL  CBC  Result Value Ref Range   WBC 5.1 3.4 - 10.8 x10E3/uL   RBC 4.86 4.14 - 5.80 x10E6/uL   Hemoglobin 14.1 13.0 - 17.7 g/dL   Hematocrit 29.543.3 62.137.5 - 51.0 %   MCV 89 79 - 97 fL   MCH 29.0 26.6 - 33.0 pg   MCHC 32.6 31 - 35 g/dL   RDW 30.813.1 65.711.6 - 84.615.4 %    Platelets 181 150 - 450 x10E3/uL    Foot dysesthesias Also discussed in October 20 next visit, history of paresthesias of bilateral feet, thought to be possible neuro impingement degenerative disc disease.  TSH obtained as above which was normal.  Low-dose gabapentin 100 mg prescribed last visit. Taking 1 at night, no change, no side effects.    Patient Active Problem List   Diagnosis Date Noted  . Movement disorder 10/21/2019  . Dementia with behavioral disturbance (HCC) 10/21/2019  . Lewy body dementia (HCC) 01/01/2019  . UTI (urinary tract infection) 09/10/2018  . Sepsis (HCC) 09/10/2018  . Acute metabolic encephalopathy 09/10/2018  . Thrombocytopenia (HCC) 09/10/2018  . Total bilirubin, elevated 09/10/2018  . 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 REM sleep behavior disorder 09/17/2017  . Chronic pain syndrome 05/21/2017  . Tremor 05/08/2017  . Gait abnormality 05/08/2017  . Transient hypotension 05/05/2017  . Contracture of joint of finger 04/16/2017  . Long-term current use of opiate analgesic 04/02/2017  . Malnutrition of moderate degree 03/28/2017  . Syncope and collapse 03/26/2017  . Closed fracture of shaft of left humerus 03/19/2017  . Subungual hematoma of great toe of right foot 12/13/2016  . Pain of toe of right foot 12/13/2016  . Paresthesia 06/29/2016  . Tremor of right hand 06/29/2016  . Mild cognitive impairment 06/29/2016  . Tremors of nervous system 06/22/2016  . Drooling 06/22/2016  . Loss of weight 06/22/2016  . Family history of throat cancer 06/22/2016  . Abnormality of gait 06/09/2016  . Acute bronchitis 03/20/2016  . Memory loss 12/31/2013  . Back pain 08/18/2013  . Arthritis   . Myoclonic jerking   . Right inguinal hernia 03/17/2011  . Umbilical hernia 03/17/2011  . Acute pulmonary embolus (HCC) 11/07/2009  . DVT of lower extremity (deep venous thrombosis) (HCC) 11/07/2009   Past Medical History:  Diagnosis  Date  . Acute pulmonary embolus (HCC) 11/07/2009   pt denies  . Arthritis    neck - limited range of motion; back, shoulders  . Chronic back pain   . Difficult intubation   . DVT of lower extremity (deep venous thrombosis) (HCC) 11/07/2009  . Occasional tremors 03/2017   HANDS   . Right inguinal hernia 02/2011  . Seizures (HCC)    pt denies  . Syncope and collapse 03/26/2017  . Umbilical hernia 02/2011   Past Surgical History:  Procedure Laterality Date  . CLOSED REDUCTION FINGER  WITH PERCUTANEOUS PINNING Left 06/26/2018   Procedure: CLOSED REDUCTION FINGER WITH PERCUTANEOUS PINNING;  Surgeon: Ernest Mallick, MD;  Location: [facility];  Service: Orthopedics;  Laterality: Left;  . HERNIA REPAIR    . I & D EXTREMITY Left 06/26/2018   Procedure: Left Index finger wound exploration, irrigation and debridement, fracture fixation, flexor tendon repair, digital nerve repair with conduit as needed and surgery as indicated;  Surgeon: Ernest Mallick, MD;  Location: [facility];  Service: Orthopedics;  Laterality: Left;   . INGUINAL HERNIA REPAIR  03/17/2011   Procedure: HERNIA REPAIR INGUINAL ADULT;  Surgeon: Ernestene Mention, MD;  Location: [facility];  Service: General;  Laterality: Right;  right inguinal hernia repair with mesh  . KNEE SURGERY  1977   left  . NERVE AND TENDON REPAIR Left 06/26/2018   Procedure: NERVE AND TENDON REPAIR;  Surgeon: Ernest Mallick, MD;  Location: [facility];  Service: Orthopedics;  Laterality: Left;  . UMBILICAL HERNIA REPAIR  03/17/2011   Procedure: HERNIA REPAIR UMBILICAL ADULT;  Surgeon: Ernestene Mention, MD;  Location: [facility];  Service: General;  Laterality: N/A;  umbilical hernia repair with mesh   No Known Allergies Prior to Admission medications   Medication Sig Start Date End Date Taking? Authorizing Provider  Carbidopa-Levodopa ER (SINEMET CR) 25-100  MG tablet controlled release Take 1 tablet by mouth in the morning, at noon, and at bedtime. 10/21/19  Yes Levert Feinstein, MD  gabapentin (NEURONTIN) 100 MG capsule Take 1 capsule (100 mg total) by mouth at bedtime. 01/09/20  Yes Shade Flood, MD  HYDROcodone-acetaminophen (NORCO) 10-325 MG tablet Take 1 tablet by mouth 5 (five) times daily as needed for moderate pain. 08/02/18  Yes [provider]  vitamin B-12 (CYANOCOBALAMIN) 500 MCG tablet Take 1 tablet (500 mcg total) by mouth daily. 09/13/18  Yes Erick Blinks, MD   Social History   Socioeconomic History  . Marital status: Married    Spouse name: [name]  . Number of children: 5  . Years of education: college  . Highest education level: Not on file  Occupational History  . Occupation: DIRECTOR    Employer: [employer]  Tobacco Use  . Smoking status: Never Smoker  . Smokeless tobacco: Never Used  Vaping Use  . Vaping Use: Never used  Substance and Sexual Activity  . Alcohol use: No  . Drug use: No  . Sexual activity: Yes    Birth control/protection: Surgical  Other Topics Concern  . Not on file  Social History Narrative   Patient lives at home with his wife [name].   Disabled.   Education college.   Right handed.   Caffeine one cup of coffee daily.   Social Determinants of Health   Financial Resource Strain:   . Difficulty of Paying Living Expenses: Not on file  Food Insecurity:   . Worried About Programme researcher, broadcasting/film/video in the Last Year: Not on file  . Ran Out of Food in the Last Year: Not on file  Transportation Needs:   . Lack of Transportation (Medical): Not on file  . Lack of Transportation (Non-Medical): Not on file  Physical Activity:   . Days of Exercise per Week: Not on file  . Minutes of Exercise per Session: Not on file  Stress:   . Feeling of Stress : Not on file  Social Connections:   . Frequency  of Communication with Friends and Family: Not on file  . Frequency of Social  Gatherings with Friends and Family: Not on file  . Attends Religious Services: Not on file  . Active Member of Clubs or Organizations: Not on file  . Attends Banker Meetings: Not on file  . Marital Status: Not on file  Intimate Partner Violence:   . Fear of Current or Ex-Partner: Not on file  . Emotionally Abused: Not on file  . Physically Abused: Not on file  . Sexually Abused: Not on file    Observations/Objective: Vitals:   01/23/20 1147  Weight: 164 lb (74.4 kg)  Height: 6\' 3"  (1.905 m)  no distress on video, tremor noted.  All questions answered.    Assessment and Plan: Loss of weight - Plan: Ambulatory referral to Urology Lewy body dementia with behavioral disturbance (HCC) History of elevated PSA  -Previous work-up above.  Borderline PSA, will have him meet with urology as no recent visit.  May still need to have input from neurology to determine if underlying suspected Lewy body dementia, or medications may be contributing.  Also discussed possible nutritionist eval to make sure he is obtaining sufficient calories.  Option to refer.  RTC precautions discussed if any acute worsening of weight loss.  All questions answered.  Paresthesia of both feet  -Some improvement, will try higher dose of gabapentin with potential side effects and risks discussed.    Follow Up Instructions: 1 month.   Patient Instructions  Ok to remain on gabapentin if that is helping your foot pain, but try 2 at night. If new side effects, return to 1 pill. Recheck in next 1 month in office.   I will refer you to urology about the borderline PSA, and to discuss weight loss.  I do recommend meeting with nutritionist to make sure you are obtaining adequate calories with your tremor.  Can also discuss the weight with your neurologist to see if weight may be relate to the movement issue. If weight decreases further in the next few weeks, or any new symptoms - return sooner.   Return to  the clinic or go to the nearest emergency room if any of your symptoms worsen or new symptoms occur.      I discussed the assessment and treatment plan with the patient. The patient was provided an opportunity to ask questions and all were answered. The patient agreed with the plan and demonstrated an understanding of the instructions.   The patient was advised to call back or seek an in-person evaluation if the symptoms worsen or if the condition fails to improve as anticipated.  I provided 22 minutes of non-face-to-face time during this encounter.   , MD

## 2020-01-23 NOTE — Telephone Encounter (Signed)
Called pt to schedule 1 month f/u with provider. On the phone pts discussed that they want the Urology referral  Doctor to be Dr, [name] because pt already familiar  with him. Please advise.

## 2020-01-23 NOTE — Telephone Encounter (Signed)
Called and spoke with pt After talking with Dr. Neva Seat. I informed the pt that Dr. [name] is a gastroenterologist. The referral was for the pt to see a urologist. I informed the pt that if he felt he needed to see gastro Dr.Green said he was happy to put in that referral. Pt declined the offer and stated they understood this information.

## 2020-01-23 NOTE — Patient Instructions (Addendum)
Ok to remain on gabapentin if that is helping your foot pain, but try 2 at night. If new side effects, return to 1 pill. Recheck in next 1 month in office.   I will refer you to urology about the borderline PSA, and to discuss weight loss.  I do recommend meeting with nutritionist to make sure you are obtaining adequate calories with your tremor.  Can also discuss the weight with your neurologist to see if weight may be relate to the movement issue. If weight decreases further in the next few weeks, or any new symptoms - return sooner.   Return to the clinic or go to the nearest emergency room if any of your symptoms worsen or new symptoms occur.

## 2020-01-28 ENCOUNTER — Telehealth: Payer: Self-pay | Admitting: *Deleted

## 2020-01-28 NOTE — Telephone Encounter (Signed)
Schedule AWV.  

## 2020-02-23 ENCOUNTER — Ambulatory Visit: Payer: Medicare Other | Admitting: Family Medicine

## 2020-03-01 ENCOUNTER — Ambulatory Visit: Payer: Medicare Other | Admitting: Neurology

## 2020-03-01 ENCOUNTER — Other Ambulatory Visit: Payer: Self-pay

## 2020-03-01 ENCOUNTER — Telehealth: Payer: Self-pay | Admitting: *Deleted

## 2020-03-01 ENCOUNTER — Encounter: Payer: Self-pay | Admitting: Neurology

## 2020-03-01 VITALS — BP 132/77 | HR 64 | Ht 75.0 in | Wt 160.0 lb

## 2020-03-01 DIAGNOSIS — G259 Extrapyramidal and movement disorder, unspecified: Secondary | ICD-10-CM

## 2020-03-01 NOTE — Telephone Encounter (Signed)
The patient had a 3pm follow up with Dr. Terrace Arabia today. They were kept up-to-date while here in the office. Unfortunately, our clinic was running behind schedule and he could not wait on the MD. I spoke to his wife and the patient would like to call back at another time to reschedule.

## 2020-03-02 ENCOUNTER — Telehealth: Payer: Self-pay | Admitting: Neurology

## 2020-03-02 NOTE — Telephone Encounter (Signed)
I returned the call to the patient's wife on DPR. She is going to reach out to an attorney to establish POA. She will call us back to let us know if any records will be needed from our office.

## 2020-03-02 NOTE — Telephone Encounter (Signed)
Pt's wife,[name] Merida (on DPR) called, would like to discuss with the nurse how to obtain Power of Attorney for my husband.

## 2020-03-09 ENCOUNTER — Other Ambulatory Visit: Payer: Self-pay | Admitting: Family Medicine

## 2020-03-09 DIAGNOSIS — R202 Paresthesia of skin: Secondary | ICD-10-CM

## 2020-03-10 ENCOUNTER — Telehealth: Payer: Self-pay | Admitting: Family Medicine

## 2020-03-10 DIAGNOSIS — K117 Disturbances of salivary secretion: Secondary | ICD-10-CM

## 2020-03-10 NOTE — Telephone Encounter (Signed)
Patient had a referral at [facility] about 2 years ago, and wife is trying to schedule a return visit for him  . So the office is asking for another referral and would like to get back into office , because drooling has stated again      Please advise

## 2020-03-10 NOTE — Telephone Encounter (Signed)
The  Real trouble the patient is having is that Dr.Crossley has retired .

## 2020-03-10 NOTE — Telephone Encounter (Signed)
Patient had a referral at [facility] about 2 years ago, and wife is trying to schedule a return visit for him, So the office is asking for another referral and would like to get back into office , because drooling has stated again. Dr. Haroldine Laws has retired since then so the pt will need a new referral. Do you need to reevaluate the pt first? I assume you would just let me know so I can get him scheduled.

## 2020-03-12 NOTE — Telephone Encounter (Signed)
Happy to place a referral to ENT.  Has he mentioned these symptoms to Dr. Bradd Burner to see if they had some recommendations as it could be related to other underlying condition?

## 2020-03-15 NOTE — Telephone Encounter (Signed)
Called pt to discuss ENT referral with pt no answer left VM

## 2020-03-16 ENCOUNTER — Telehealth: Payer: Self-pay | Admitting: Family Medicine

## 2020-03-24 ENCOUNTER — Ambulatory Visit: Payer: Medicare Other | Admitting: Podiatry

## 2020-03-24 ENCOUNTER — Other Ambulatory Visit: Payer: Self-pay

## 2020-03-24 DIAGNOSIS — M79674 Pain in right toe(s): Secondary | ICD-10-CM

## 2020-03-24 DIAGNOSIS — B351 Tinea unguium: Secondary | ICD-10-CM | POA: Diagnosis not present

## 2020-03-24 DIAGNOSIS — L989 Disorder of the skin and subcutaneous tissue, unspecified: Secondary | ICD-10-CM | POA: Diagnosis not present

## 2020-03-24 NOTE — Progress Notes (Signed)
   SUBJECTIVE Patient presents to office today complaining of elongated, thickened nails that cause pain while ambulating in shoes.  He is unable to trim his own nails. Patient is here for further evaluation and treatment.  Past Medical History:  Diagnosis Date  . Acute pulmonary embolus (HCC) 11/07/2009   pt denies  . Arthritis    neck - limited range of motion; back, shoulders  . Chronic back pain   . Difficult intubation   . DVT of lower extremity (deep venous thrombosis) (HCC) 11/07/2009  . Occasional tremors 03/2017   HANDS   . Right inguinal hernia 02/2011  . Seizures (HCC)    pt denies  . Syncope and collapse 03/26/2017  . Umbilical hernia 02/2011    OBJECTIVE General Patient is awake, alert, and oriented x 3 and in no acute distress. Derm Skin is dry and supple bilateral. Negative open lesions or macerations. Remaining integument unremarkable. Nails are tender, long, thickened and dystrophic with subungual debris, consistent with onychomycosis, 1-5 bilateral. No signs of infection noted.  Hyperkeratotic preulcerative callus tissue noted bilateral feet Vasc  DP and PT pedal pulses palpable bilaterally. Temperature gradient within normal limits.  Neuro Epicritic and protective threshold sensation grossly intact bilaterally.  Musculoskeletal Exam No symptomatic pedal deformities noted bilateral. Muscular strength within normal limits.  ASSESSMENT 1. Onychodystrophic nails 1-5 bilateral with hyperkeratosis of nails.  2. Onychomycosis of nail due to dermatophyte bilateral 3. Pain in foot bilateral 4.  Preulcerative callus lesions bilateral feet  PLAN OF CARE 1. Patient evaluated today.  2. Instructed to maintain good pedal hygiene and foot care.  3. Mechanical debridement of nails 1-5 bilaterally performed using a nail nipper. Filed with dremel without incident.  4.  Excisional debridement of the hyperkeratotic preulcerative callus lesions was performed using a tissue nipper  without incident or bleeding  5.  Return to clinic in 3 mos.    Felecia Shelling, DPM [facility]  Dr. Felecia Shelling, DPM    [address]                                        [place], [place] [address]                Office [phone]  Fax [phone]

## 2020-03-26 DIAGNOSIS — N3000 Acute cystitis without hematuria: Secondary | ICD-10-CM | POA: Diagnosis not present

## 2020-03-29 ENCOUNTER — Telehealth: Payer: Self-pay

## 2020-03-29 NOTE — Telephone Encounter (Signed)
Copied from CRM [id number]. Topic: General - Other >> Mar 26, 2020  2:42 PM Gwenlyn Fudge wrote: Reason for CRM: Revonda Standard, from Dr Avel Sensor office, calling stating that the doctor cannot see pt due to the pt needing to have lewi body looked at. She states that the pt is needing to see an ENT or a Neurologist. Please advise.   [phone]

## 2020-03-29 NOTE — Telephone Encounter (Signed)
Pt needs to see ENT or Neuro before Dr Luther Hearing office will see him please advise

## 2020-03-30 NOTE — Telephone Encounter (Signed)
Called spoke with the referral coordinator at their office to ask what they need from neurology notes. Turns out our message was wrong Dr Suszanne Conners will simply not see pt and advises pt sees neuro for this as Dr Suszanne Conners doesn't treat this.  please advise.

## 2020-03-30 NOTE — Telephone Encounter (Signed)
He is followed by neurology - both locally and with [facility]. Notes in [place], but let me know if further info needed.

## 2020-03-31 NOTE — Telephone Encounter (Signed)
Noted.  Please call patient with this information. Referral to Dr. Suszanne Conners was declined, and wanted neuro to discuss.   Would he like [place] to try a different ear nose and throat specialist or would he like to discuss the symptoms with neuro first?

## 2020-04-01 NOTE — Telephone Encounter (Signed)
Pt wife states she really wants an ENT to see him for treatment of Drooling did not want to see Neuro as she states they are aware and have done nothing to treat. Please advise

## 2020-04-01 NOTE — Telephone Encounter (Signed)
See prior notes. Please try other office for ENT.

## 2020-04-15 DIAGNOSIS — R8279 Other abnormal findings on microbiological examination of urine: Secondary | ICD-10-CM | POA: Diagnosis not present

## 2020-04-20 NOTE — Progress Notes (Signed)
Patient left without being seen.

## 2020-04-22 ENCOUNTER — Other Ambulatory Visit: Payer: Self-pay

## 2020-04-22 ENCOUNTER — Ambulatory Visit: Payer: Medicare Other | Admitting: Neurology

## 2020-04-22 ENCOUNTER — Encounter: Payer: Self-pay | Admitting: Neurology

## 2020-04-22 VITALS — BP 142/74 | HR 70 | Ht 74.0 in | Wt 167.0 lb

## 2020-04-22 DIAGNOSIS — K117 Disturbances of salivary secretion: Secondary | ICD-10-CM | POA: Diagnosis not present

## 2020-04-22 DIAGNOSIS — G259 Extrapyramidal and movement disorder, unspecified: Secondary | ICD-10-CM

## 2020-04-22 MED ORDER — DONEPEZIL HCL 5 MG PO TABS: 5.0000 mg | ORAL_TABLET | Freq: Every day | ORAL | 5 refills | Status: DC

## 2020-04-22 NOTE — Patient Instructions (Addendum)
Try to stop the Sinemet, see how his condition changes Once you make this change, add on Aricept 5 mg at bedtime, would suggest do 1 thing at a time Will get you set up for Botox with Dr. Terrace Arabia for drooling See you back in 6 months

## 2020-04-22 NOTE — Progress Notes (Signed)
PATIENT: Cory Johnson DOB: 1955/08/05  REASON FOR VISIT: follow up HISTORY FROM: patient  HISTORY OF PRESENT ILLNESS: Today 04/22/20  HISTORY Cory Johnson is a 65 years old right-handed male, accompanied by his wife to follow-up for memory loss, history of REM sleep disorder,  He had a history of cervical fusion, due to advanced degenerative disc disease, presenting with progressive anterocollis, severe limited range of motion of his neck muscles  I saw him previously for frequent nocturnal jerking movements. He described that he had frequent transient jerking movements of his body while he was falling into sleep, he was able to quickly drifting back asleep again. He also also has excessive movement during the night sleep, kicking, moving his limbs, occasionally talking, cause a lot of complains from his wife. he often dreamed of playing basketball. He denied excessive snoring, or stop breathing during the the snoring.  He also complain excessive daytime fatigue, especially during the evening time, he would easily doze off sitting at the meetings or watching TV.   He had sleep study in October 2013, which indeed demonstrate REM sleep disorder. There is no sleep apnea.  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 CT scan of the brain was  normal.  He continued to complains of neck pain, REM sleep disorder, frequent awakening during nighttime because of the neck pain, he is exercising regularly  Update June 29 2016, Last clinical visit was with Hoyle Sauer on June 09 2016, he noticed worsening memory loss, he had sudden worsening right hand tremor since March 2018, only mild left hand tremor, he denies significant gait abnormality, he does notice mild difficulties with smell, he still acts out of dreams, he is no longer taking clonazepam, complains of nauseous with clonazepam, he feels coldness in his fingers and toes,   He has lost weight in past few months, is 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,  He fell broke his left humeral in May 2018, did not have surgery,  We reviewed laboratory evaluation in April 2018, normal thyroid functional tests, CPK, ESR, B12, RPR, with mild elevated C reactive protein 14.2,  He continues to complaining worsening bilateral hands tremor right worse than left, dragging his foot across the floor, chronic constipation, but is on chronic narcotics treatment for his low back pain, neck pain, continue have REM sleep disorder, poor sleep, poor sense of smell, denied dizziness when getting up quickly,  UPDATE May 08 2017: He has significant bilateral hands tremor, right worse than left, but denies loss sense of smell, continue to have  mild memory loss, mild gait abnormality due to his neck flexion.  UPDATE September 17 2017: He was referred back by his primary care Dr. Carlota Raspberry for evaluation of weight loss, about 20 pounds since January 2019 by reviewing the  chart,  Patient exercise daily, has mildly decreased appetite, frequent awakening at nighttime using bathroom, 1 reason  that they are more concerned about his weight is due to because he got schedule as Christmas gift, he has been taking his weight regularly. He actually had bigger weight fluctuation between May to October 2018, based on record.  He continue complains of bilateral hand shaking, denies significant gait abnormality, mild low back pain  Laboratory evaluation on August 30, 2017 showed normal CMP, prealbumin of 16, ESR of 5, C-reactive protein of 15, elevated PSA of 6.9, normal lipid profile, LDL of 86, normal CMP, TSH    UPDATE Sept 19 2019: He continues to have significant weight loss, he eats well, sleep well, continue have significant bilateral hands tremor.   Personally reviewed MRI brain in August 2019 from outside hospital showed mild generalized atrophy, small vessel disease.  For his tremor, we have tried Inderal without helping, previously tried to Sinemet and without significant improvement  Wife also emailed [place] earlier, complains that patient feel frustrated easily,    UPDATE Augus 10 2021: He was seen by movement specialist at [facility] Dr. Deboraha Sprang since July 2020, most recent office visit was in June 2021, will continue follow-up with Dr. Deboraha Sprang on a yearly basis  I personally reviewed multiple notes from Dr. Deboraha Sprang, history of parkinsonism, visual hallucinations, REM sleep disorder,confirmed by sleep study, neck drop secondary to degenerative cervical disease, mild cognitive impairment, MOCA 19/30 on August 20 2019, primarily affection attention, visual-spatial domains.   Worsening visual hallucinations on a very low dose of Sinemet, teenager talking with him, GI intolerance to higher dose, he did improve from motor standpoint with Sinemet, UPDRS score improved from 46 to 34,    He is now taking Sinmet 25/100 twice a day now, he can sleeps  better, stills dreams a lots,  Wife concerned about his decreased appetite, poor sleep quality, woke up multiple times at night, mildly unsteady gait, but not fall yet.  Update April 22, 2020 SS: Here today with his wife. Right hand is resting tremor. On Sinemet 25/100 mg 1 tablet 3 times daily. Takes at 10 am, 5 PM, 10 PM. Has excessive saliva, drooling. No significant hallucinations, no longer hearing teenagers, he has dreams at night, not scary. Sleeps well, but gets up several times to urinate. He notices any movement his wife makes during the night. Previously talked with Dr. Linus Mako about botox for drooling. Memory stable, he still walks on treadmill at gym. No falls. Weight is stable since last visit Aug 21. His ability to get up from chair, tremors were better on Sinemet (per Dr. Linus Mako note June 2021), his wife isn't sure how much the Sinemet is helping. MMSE was not done today.  REVIEW OF SYSTEMS: Out of a complete 14 system review of symptoms, the patient complains only of the following symptoms, and all other reviewed systems are negative.  See HPI  ALLERGIES: No Known Allergies  HOME MEDICATIONS: Outpatient Medications Prior to Visit  Medication Sig Dispense Refill  . Carbidopa-Levodopa ER (SINEMET CR) 25-100 MG tablet controlled release Take 1 tablet by mouth in the morning, at noon, and at bedtime. 90 tablet 11  . HYDROcodone-acetaminophen (NORCO) 10-325 MG tablet Take 1 tablet by  mouth 5 (five) times daily as needed for moderate pain.    . vitamin B-12 (CYANOCOBALAMIN) 500 MCG tablet Take 1 tablet (500 mcg total) by mouth daily. 30 tablet 0   No facility-administered medications prior to visit.    PAST MEDICAL HISTORY: Past Medical History:  Diagnosis Date  . Acute pulmonary embolus ([place]) 11/07/2009   pt denies  . Arthritis    neck - limited range of motion; back, shoulders  . Chronic back pain   . Difficult intubation   . DVT of lower extremity (deep venous  thrombosis) ([place]) 11/07/2009  . Occasional tremors 03/2017   HANDS   . Right inguinal hernia 02/2011  . Seizures ([place])    pt denies  . Syncope and collapse 03/26/2017  . Umbilical hernia 25/0539    PAST SURGICAL HISTORY: Past Surgical History:  Procedure Laterality Date  . CLOSED REDUCTION FINGER WITH PERCUTANEOUS PINNING Left 06/26/2018   Procedure: CLOSED REDUCTION FINGER WITH PERCUTANEOUS PINNING;  Surgeon: Verner Mould, MD;  Location: [facility];  Service: Orthopedics;  Laterality: Left;  . HERNIA REPAIR    . I & D EXTREMITY Left 06/26/2018   Procedure: Left Index finger wound exploration, irrigation and debridement, fracture fixation, flexor tendon repair, digital nerve repair with conduit as needed and surgery as indicated;  Surgeon: Verner Mould, MD;  Location: [facility];  Service: Orthopedics;  Laterality: Left;  46mn  . INGUINAL HERNIA REPAIR  03/17/2011   Procedure: HERNIA REPAIR INGUINAL ADULT;  Surgeon: HAdin Hector MD;  Location: [facility]  Service: General;  Laterality: Right;  right inguinal hernia repair with mesh  . [place]  left  . NERVE AND TENDON REPAIR Left 06/26/2018   Procedure: NERVE AND TENDON REPAIR;  Surgeon: CVerner Mould MD;  [place] [facility]  Service: Orthopedics;  Laterality: Left;  . UMBILICAL HERNIA REPAIR  03/17/2011   Procedure: HERNIA REPAIR UMBILICAL ADULT;  Surgeon: HAdin Hector MD;  Location: [facility]  Service: General;  Laterality: N/A;  umbilical hernia repair with mesh    FAMILY HISTORY: Family History  Problem Relation Age of Onset  . Hypertension Other     SOCIAL HISTORY: Social History   Socioeconomic History  . Marital status: Married    Spouse name: WDerwood Kaplan . Number of children: 5  . Years of education: college  . Highest education level: Not on file  Occupational History  . Occupation: DIRECTOR     Employer: [employer]  Tobacco Use  . Smoking status: Never Smoker  . Smokeless tobacco: Never Used  Vaping Use  . Vaping Use: Never used  Substance and Sexual Activity  . Alcohol use: No  . Drug use: No  . Sexual activity: Yes    Birth control/protection: Surgical  Other Topics Concern  . Not on file  Social History Narrative   Patient lives at home with his wife ([name].   Disabled.   Education college.   Right handed.   Caffeine one cup of coffee daily.   Social Determinants of Health   Financial Resource Strain: Not on file  Food Insecurity: Not on file  Transportation Needs: Not on file  Physical Activity: Not on file  Stress: Not on file  Social Connections: Not on file  Intimate Partner Violence: Not on file   PHYSICAL EXAM  Vitals:   04/22/20 1324  BP: (!) 142/74  Pulse: 70  Weight: 167 lb (75.8 kg)  Height: 6' 2"  (1.88 m)   Body mass index is 21.44 kg/m.  Generalized: Well developed, in no acute distress  Neurological examination  Mentation: Alert, oriented, history is equally provided by the patient, follows exam commands, speech is mumbled Cranial nerve II-XII: Pupils were equal round reactive to light. Extraocular movements were full, visual field were full on confrontational test.  Limited range of motion of neck, fixed anterior flexion Motor: He has a prominent resting tremor noted to the right upper extremity and postural, mild postural tremor to the left upper extremity Sensory: Sensory testing is intact to soft touch on all 4 extremities. No evidence of extinction is noted.  Coordination: Cerebellar testing reveals good finger-nose-finger and heel-to-shin bilaterally.  Gait and station: Has to push off from seated position to stand,, moderate strides, forward leaning due to fixed neck flexion, decreased arm swing on the left, no tremor was noted Reflexes: Deep tendon reflexes are hypoactive  DIAGNOSTIC DATA (LABS, IMAGING,  TESTING) - I reviewed patient records, labs, notes, testing and imaging myself where available.  Lab Results  Component Value Date   WBC 5.1 01/09/2020   HGB 14.1 01/09/2020   HCT 43.3 01/09/2020   MCV 89 01/09/2020   PLT 181 01/09/2020      Component Value Date/Time   NA 140 01/09/2020 1212   K 4.1 01/09/2020 1212   CL 101 01/09/2020 1212   CO2 30 (H) 01/09/2020 1212   GLUCOSE 96 01/09/2020 1212   GLUCOSE 126 (H) 09/20/2018 1655   BUN 10 01/09/2020 1212   CREATININE 0.81 01/09/2020 1212   CREATININE 0.74 06/10/2014 1409   CALCIUM 9.3 01/09/2020 1212   PROT 7.2 01/09/2020 1212   ALBUMIN 4.4 01/09/2020 1212   AST 15 01/09/2020 1212   ALT 7 01/09/2020 1212   ALKPHOS 68 01/09/2020 1212   BILITOT 0.6 01/09/2020 1212   GFRNONAA 93 01/09/2020 1212   GFRAA 107 01/09/2020 1212   Lab Results  Component Value Date   CHOL 176 01/29/2019   HDL 76 01/29/2019   LDLCALC 88 01/29/2019   TRIG 64 01/29/2019   CHOLHDL 2.3 01/29/2019   Lab Results  Component Value Date   HGBA1C (H) 11/08/2009    6.1 (NOTE)                                                                       According to the ADA Clinical Practice Recommendations for 2011, when HbA1c is used as a screening test:   >=6.5%   Diagnostic of Diabetes Mellitus           (if abnormal result  is confirmed)  5.7-6.4%   Increased risk of developing Diabetes Mellitus  References:Diagnosis and Classification of Diabetes Mellitus,Diabetes ENID,7824,23(NTIRW 1):S62-S69 and Standards of Medical Care in         Diabetes - 2011,Diabetes Care,2011,34  (Suppl 1):S11-S61.   Lab Results  Component Value Date   VITAMINB12 350 09/11/2018   Lab Results  Component Value Date   TSH 1.210 01/09/2020   ASSESSMENT AND PLAN 65 y.o. year old male  has a past medical history of Acute pulmonary embolus ([place]) (11/07/2009), Arthritis, Chronic back pain, Difficult intubation, DVT of lower  extremity (deep venous thrombosis) ([place]) (11/07/2009),  Occasional tremors (03/2017), Right inguinal hernia (02/2011), Seizures ([place]), Syncope and collapse (39/07/6467), and Umbilical hernia (80/6078). here with:  1. Central nervous system degenerative disorder 2. Sialorrhea -Long history of REM sleep disorder, visual hallucinations, sensitive to medications, progressive worsening memory loss, significant action/postural tremor, mild parkinsonism -Unclear benefit of Sinemet, currently taking 25/100 mg 3 times daily, saw the patient with Dr. Krista Blue, he will stop the medication, get a clear picture of how it is helping, restart right away if notable decline -Has seen movement specialist Dr. Deboraha Sprang documented improvement by UPDRS score (last June 2021, not going routinely due to long drive) -Will get set up for Botox injection for Sialorrhea with Dr. Krista Blue -At next appointment with Dr. Krista Blue, will consider add on Aricept 5 mg at bedtime, help with hallucinations, script sent in but will wait to see change of Sinemet first  3. Fused cervical spine limited range of motion cervical spine was fixed anterocollis -With evidence of cervical degenerative changes -Continue moderate exercise  I spent 30 minutes of face-to-face and non-face-to-face time with patient.  This included previsit chart review, lab review, study review, order entry, electronic health record documentation, patient education.  Butler Denmark, AGNP-C, DNP 04/22/2020, 3:53 PM [facility] [address] [place], [place] [address] [phone]

## 2020-04-26 ENCOUNTER — Telehealth: Payer: Self-pay

## 2020-04-26 ENCOUNTER — Telehealth: Payer: Self-pay | Admitting: Neurology

## 2020-04-26 NOTE — Telephone Encounter (Signed)
Pt's spouse called today to see if a B12 prescription was sent in to [facility] on 2/10 best call back is  [phone]

## 2020-04-26 NOTE — Telephone Encounter (Signed)
It appears prior recommended dose was qd. Would return to that dose unless higher dose authorized by neuro.

## 2020-04-26 NOTE — Telephone Encounter (Signed)
Pt. Called to inform the practice that they had accidentally gotten a bottle of B-12 with per tablet. Pt. Wanted to know if they were ok to take

## 2020-04-26 NOTE — Telephone Encounter (Signed)
I don't think we are filling the B12. I don't see a recent blood level that was done? In adults with normal absorption, oral dosing is 1000 mcg orally once per day. May have to check with PCP, if the question is better for 3000 every 3 days, vs 1000 mcg daily, I would think the daily dose would be better.

## 2020-04-26 NOTE — Telephone Encounter (Signed)
I relayed to wife of pt check with pcp about this as he may have prescribed or recommended for pt.  She verbalized understanding and appreciated call back.

## 2020-04-26 NOTE — Telephone Encounter (Signed)
I called wife and she picked up B12 by mistake.  Ok to give daily or give every 3rd day?

## 2020-04-26 NOTE — Telephone Encounter (Signed)
No previous note for B-12 is 3000 mcg okay?

## 2020-04-28 ENCOUNTER — Telehealth: Payer: Self-pay | Admitting: Neurology

## 2020-04-28 DIAGNOSIS — Z5181 Encounter for therapeutic drug level monitoring: Secondary | ICD-10-CM | POA: Diagnosis not present

## 2020-04-28 DIAGNOSIS — M5136 Other intervertebral disc degeneration, lumbar region: Secondary | ICD-10-CM | POA: Diagnosis not present

## 2020-04-28 DIAGNOSIS — Z79899 Other long term (current) drug therapy: Secondary | ICD-10-CM | POA: Diagnosis not present

## 2020-04-28 DIAGNOSIS — G894 Chronic pain syndrome: Secondary | ICD-10-CM | POA: Diagnosis not present

## 2020-04-28 NOTE — Telephone Encounter (Signed)
Received Botox charge sheet and signed patient consent which was given to medical records to be scanned. 100 ([id number]) units for K11.7 (79728). I called patient's insurance, [facility] [employer] [phone]) and spoke with Ren to check PA requirements for the procedure codes. Ren states no PA is required for either code. Reference [id number].  I called the patient and spoke with his wife. She states that Botox is just too expensive for them at this time but would like to discuss other options for treatment.

## 2020-04-29 MED ORDER — GLYCOPYRROLATE 1 MG PO TABS: 1.0000 mg | ORAL_TABLET | Freq: Three times a day (TID) | ORAL | 5 refills | Status: DC

## 2020-04-29 NOTE — Telephone Encounter (Signed)
Meds ordered this encounter  Medications  . glycopyrrolate (ROBINUL) 1 MG tablet    Sig: Take 1 tablet (1 mg total) by mouth 3 (three) times daily.    Dispense:  90 tablet    Refill:  5    Please let patient know, I have E prescribed glycopyrrolate 1 mg 3 times a day for his reported hypersialorrhea,   Common side effect  Dermatologic: Flushing (Oral, 30% )  Gastrointestinal: Constipation (Oral, 35% ), Vomiting (Inhalation, less than 1% ; oral, 40% ), Xerostomia (Oral, 40% )  Renal: Urinary tract infectious disease (Inhalation, 1.4% to greater than 2% ; oral, less than 2% )  Respiratory: Dyspnea (Inhalation, 2% or greater ), Nasal congestion (Oral, up to 30% ), Nasopharyngitis (Inhalation, 1% to greater than 2% ), Upper respiratory infection (Inhalation, 1% to 3.4% ; oral, up to 15%

## 2020-04-29 NOTE — Addendum Note (Signed)
Addended by: Levert Feinstein on: 04/29/2020 01:56 PM   Modules accepted: Orders

## 2020-04-29 NOTE — Telephone Encounter (Signed)
Called patient and wife was on phone on speaker. I advised of Dr Zannie Cove new Rx, possible side effects.  Wife stated he already has several medical problems, not sure they want to try the new medicine. I advised it is their decision, but can try and can let Dr Terrace Arabia know if he experiences any new symptoms or worsening conditions.  Patient stated they discussed deep brain therapy with Dr Terrace Arabia and he wants to see what the cost will be. I advised will send message ot Dr Terrace Arabia. Patient and wife verbalized understanding, appreciation. Wife stated he will give mew medicine a try.

## 2020-04-29 NOTE — Telephone Encounter (Signed)
He needs to discuss with Dr. Frederich Chick, movement speciality at [facility] for deep brain stimulator.

## 2020-04-29 NOTE — Telephone Encounter (Signed)
Pt wife advise she will get appropriate dose and restart that

## 2020-05-03 ENCOUNTER — Telehealth: Payer: Self-pay | Admitting: *Deleted

## 2020-05-03 NOTE — Telephone Encounter (Signed)
Called wife and advised her to discuss with Dr Frederich Chick, [facility]. She verbalized understanding, appreciation.

## 2020-05-03 NOTE — Telephone Encounter (Signed)
PA for glycopyrrolate completed on covermymeds (key: [id number]). Pt has pharmacy coverage through [employer] [phone]). [id number] approved through 03/12/2021.

## 2020-05-28 DIAGNOSIS — N3000 Acute cystitis without hematuria: Secondary | ICD-10-CM | POA: Diagnosis not present

## 2020-06-09 ENCOUNTER — Ambulatory Visit: Payer: Medicare Other | Admitting: Neurology

## 2020-06-15 IMAGING — DX LEFT HAND - COMPLETE 3+ VIEW
3 series · 4 of 4 positions shown · non-contrast
Comparison: None.

CLINICAL DATA: Left index finger laceration

EXAM:
LEFT HAND - COMPLETE 3+ VIEW

[hand ap]
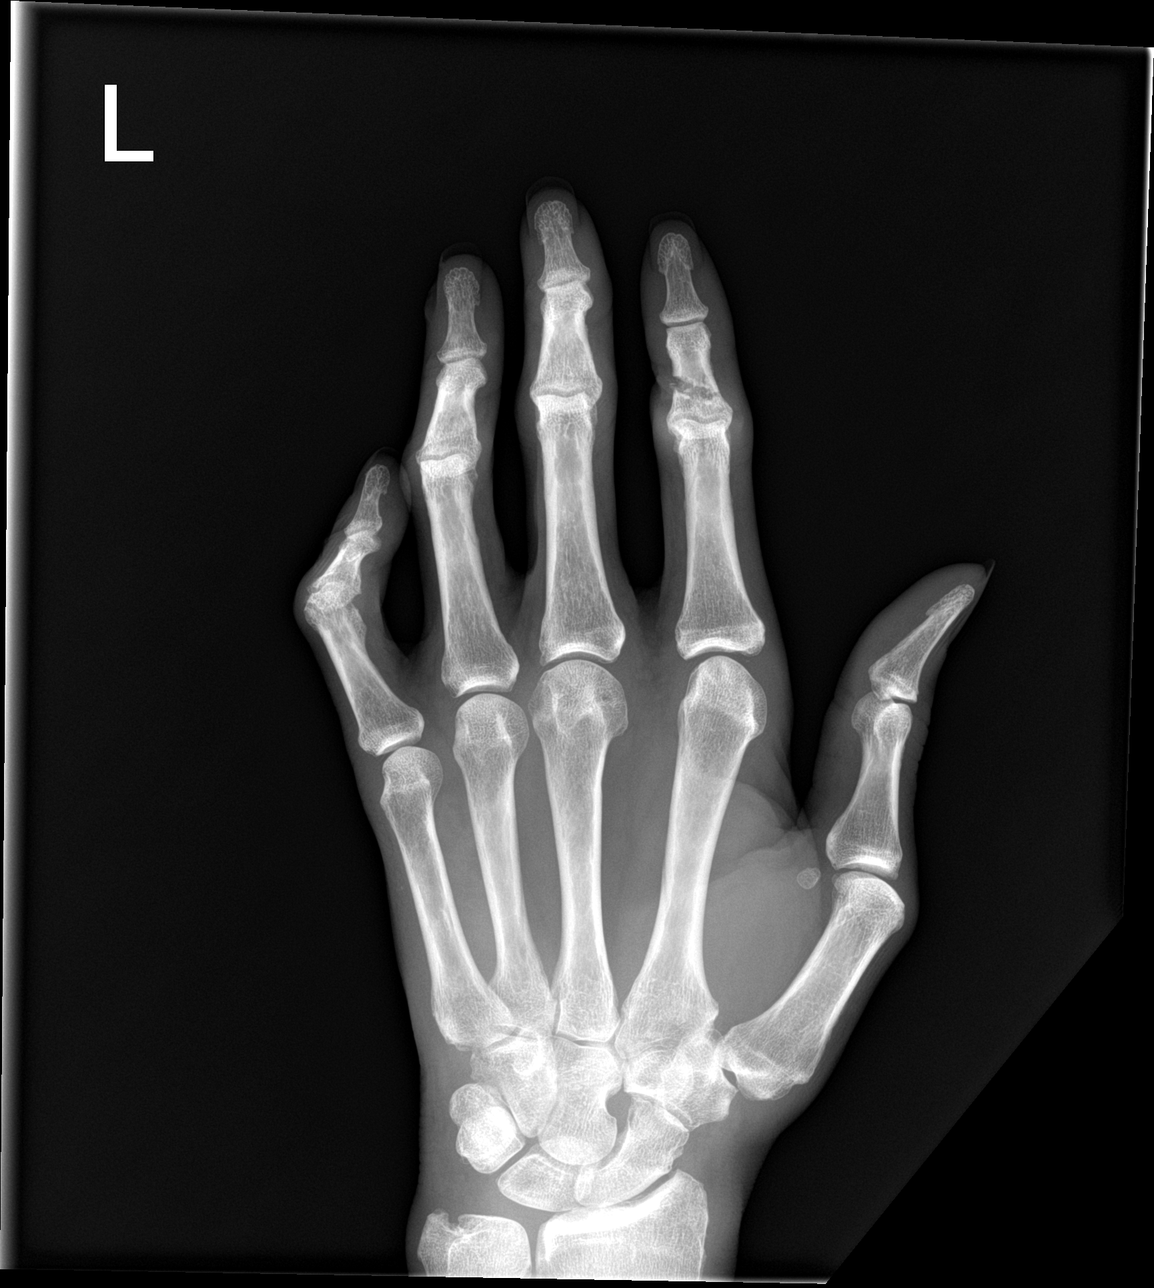

[hand obl]
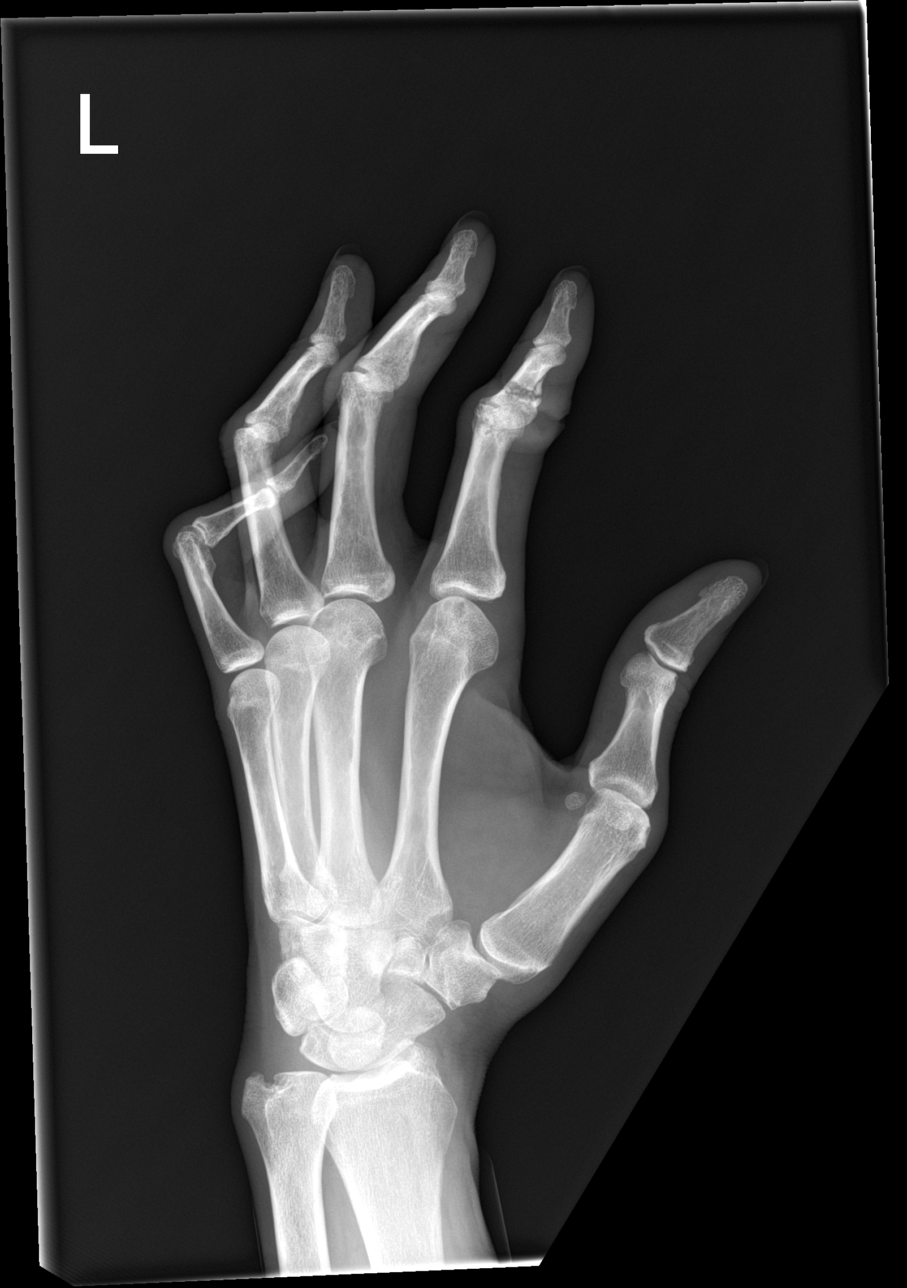

[Series 3: hand lat · 0.14mm/px · 2 of 2 slices shown]
[im 1/2]
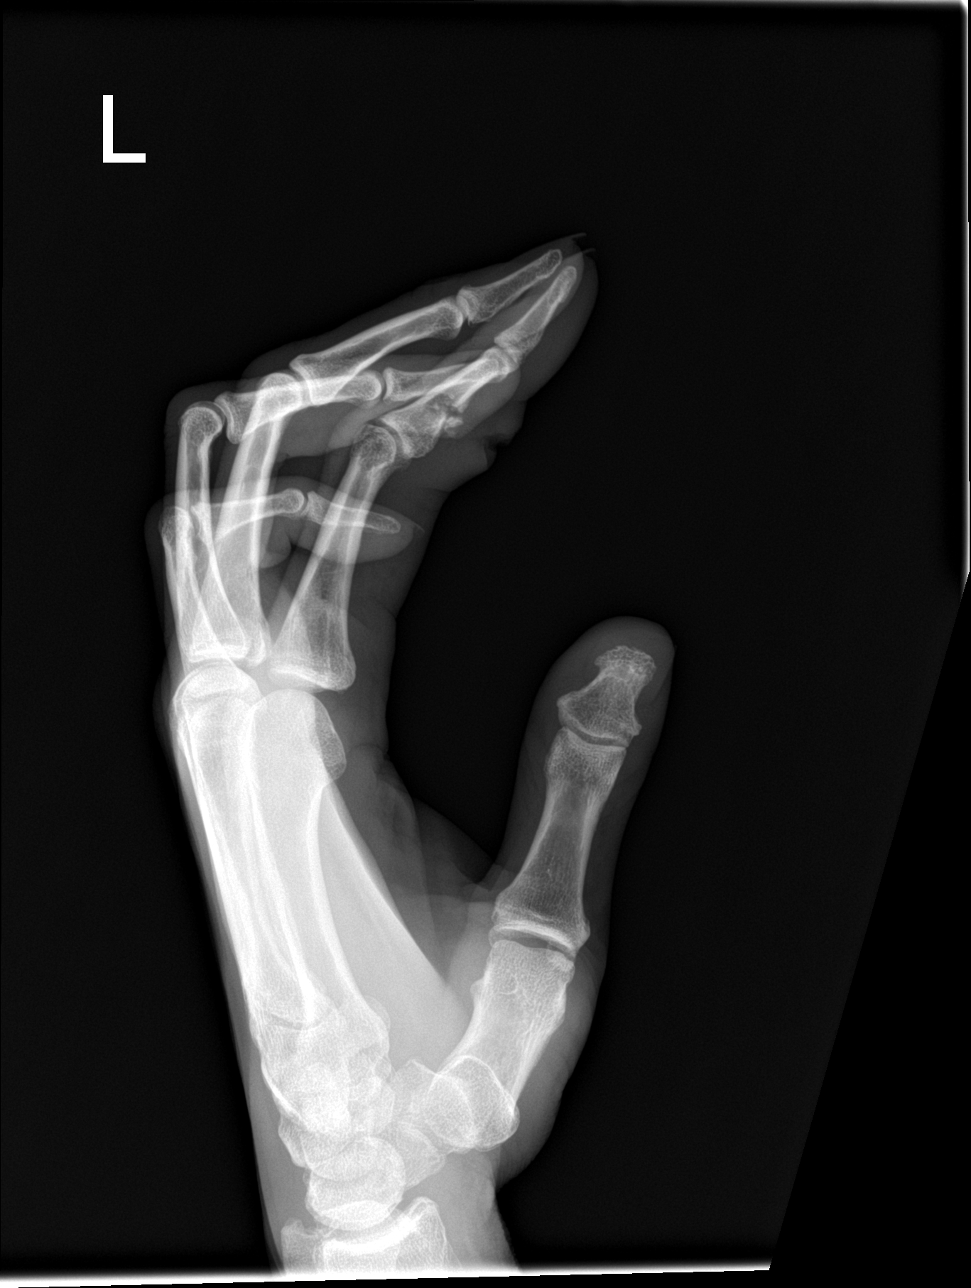
[im 2/2]
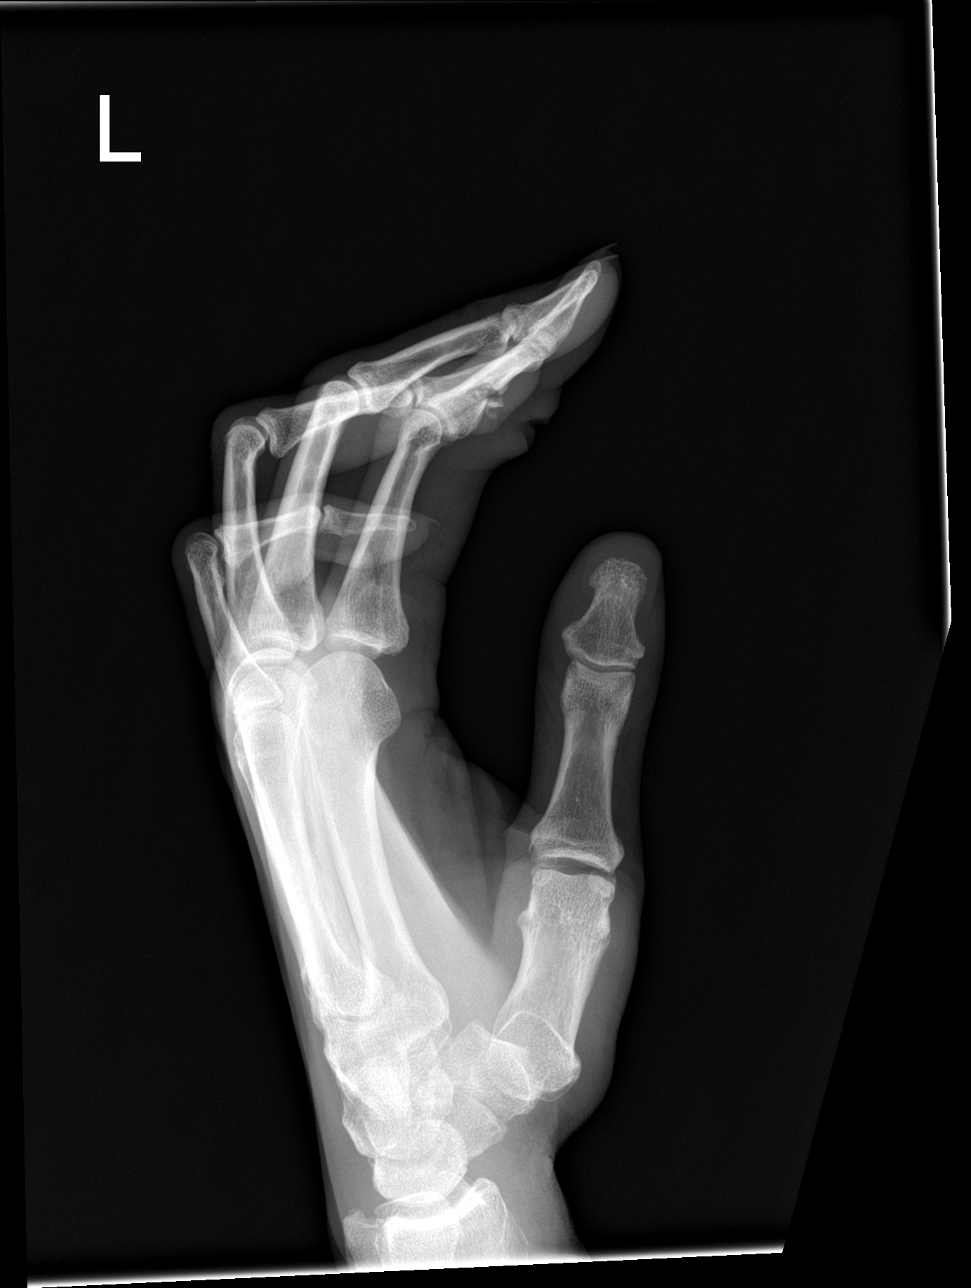

[4 of 4 positions shown; findings below may reference images not displayed]

FINDINGS: Large laceration at the ventral surface of the left index finger.
There is a minimally displaced oblique fracture of the middle
phalanx of the index finger. No other osseous abnormality. No
radiopaque foreign body.
IMPRESSION: 1. Minimally displaced, predominantly oblique, comminuted fracture
of the middle phalanx of the left index finger.
2. Large volar laceration of the index finger.
3. No radiopaque foreign body.

## 2020-06-18 DIAGNOSIS — N3000 Acute cystitis without hematuria: Secondary | ICD-10-CM | POA: Diagnosis not present

## 2020-06-19 DIAGNOSIS — N200 Calculus of kidney: Secondary | ICD-10-CM | POA: Diagnosis not present

## 2020-06-19 DIAGNOSIS — K5790 Diverticulosis of intestine, part unspecified, without perforation or abscess without bleeding: Secondary | ICD-10-CM | POA: Diagnosis not present

## 2020-06-19 DIAGNOSIS — N323 Diverticulum of bladder: Secondary | ICD-10-CM | POA: Diagnosis not present

## 2020-06-19 DIAGNOSIS — G20 Parkinson's disease: Secondary | ICD-10-CM | POA: Diagnosis not present

## 2020-06-22 ENCOUNTER — Ambulatory Visit: Payer: Medicare Other | Admitting: Podiatry

## 2020-06-28 DIAGNOSIS — H6123 Impacted cerumen, bilateral: Secondary | ICD-10-CM | POA: Diagnosis not present

## 2020-06-28 DIAGNOSIS — J300 Vasomotor rhinitis: Secondary | ICD-10-CM | POA: Diagnosis not present

## 2020-06-28 DIAGNOSIS — H903 Sensorineural hearing loss, bilateral: Secondary | ICD-10-CM | POA: Insufficient documentation

## 2020-06-28 DIAGNOSIS — H61893 Other specified disorders of external ear, bilateral: Secondary | ICD-10-CM | POA: Diagnosis not present

## 2020-07-02 DIAGNOSIS — R3915 Urgency of urination: Secondary | ICD-10-CM | POA: Diagnosis not present

## 2020-07-02 DIAGNOSIS — G20 Parkinson's disease: Secondary | ICD-10-CM | POA: Diagnosis not present

## 2020-07-28 ENCOUNTER — Ambulatory Visit: Payer: Medicare Other | Admitting: Podiatry

## 2020-08-11 ENCOUNTER — Other Ambulatory Visit: Payer: Self-pay

## 2020-08-11 ENCOUNTER — Ambulatory Visit: Payer: Medicare Other | Admitting: Podiatry

## 2020-08-11 ENCOUNTER — Encounter: Payer: Self-pay | Admitting: Podiatry

## 2020-08-11 DIAGNOSIS — B351 Tinea unguium: Secondary | ICD-10-CM

## 2020-08-11 DIAGNOSIS — L603 Nail dystrophy: Secondary | ICD-10-CM | POA: Diagnosis not present

## 2020-08-11 DIAGNOSIS — D696 Thrombocytopenia, unspecified: Secondary | ICD-10-CM | POA: Diagnosis not present

## 2020-08-11 DIAGNOSIS — M79674 Pain in right toe(s): Secondary | ICD-10-CM | POA: Diagnosis not present

## 2020-08-11 NOTE — Progress Notes (Signed)
This patient returns to my office for at risk foot care.  This patient requires this care by a professional since this patient will be at risk due to having thrombocytopenia.  This patient is unable to cut nails himself since the patient cannot reach his nails.These nails are painful walking and wearing shoes. He presents to the office with his wife. This patient presents for at risk foot care today.  General Appearance  Alert, conversant and in no acute stress.  Vascular  Dorsalis pedis and posterior tibial  pulses are palpable  bilaterally.  Capillary return is within normal limits  bilaterally. Temperature is within normal limits  bilaterally.  Neurologic  Senn-Weinstein monofilament wire test absent   bilaterally. Muscle power within normal limits bilaterally.  Nails Thick disfigured discolored nails with subungual debris  from hallux to fifth toes bilaterally. No evidence of bacterial infection or drainage bilaterally.  Orthopedic  No limitations of motion  feet .  No crepitus or effusions noted.  No bony pathology .  HAV  B/L.  Hammer toes  2-5  B/L.  Midfoot  DJD  B/L.  Skin  normotropic skin with no porokeratosis noted bilaterally.  No signs of infections or ulcers noted.     Onychomycosis  Pain in right toes  Pain in left toes  Consent was obtained for treatment procedures.   Mechanical debridement of nails 1-5  bilaterally performed with a nail nipper.  Filed with dremel without incident.    Return office visit     3   months                 Told patient to return for periodic foot care and evaluation due to potential at risk complications.   Geron Mulford DPM  

## 2020-09-04 IMAGING — DX PORTABLE CHEST - 1 VIEW
1 series · 1 of 1 positions shown · non-contrast
Comparison: 09/24/2017 and earlier.

CLINICAL DATA: 63-year-old male recently diagnosed with UTI.
Confusion.

EXAM:
PORTABLE CHEST 1 VIEW

[chest ap]
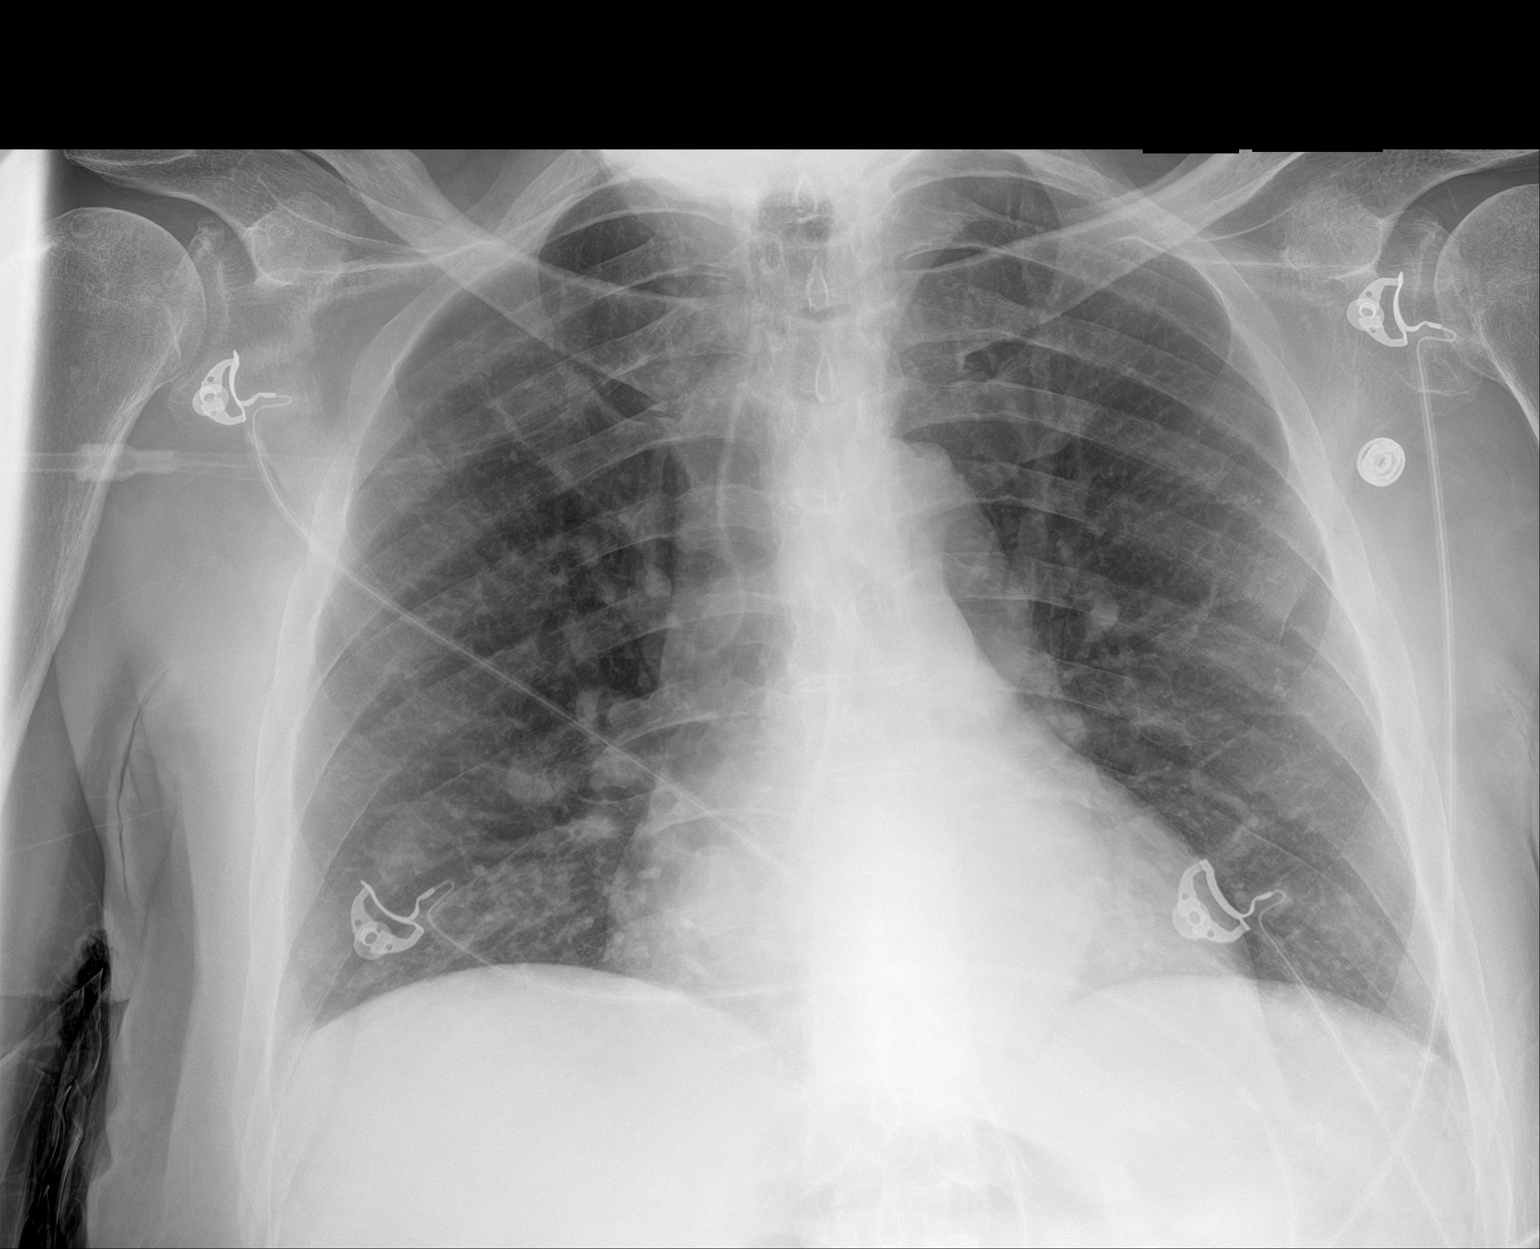

[1 of 1 positions shown; findings below may reference images not displayed]

FINDINGS: Portable AP semi upright view at 7073 hours. Larger lung volumes.
Stable mildly tortuous thoracic aorta. Other mediastinal contours
are within normal limits. Visualized tracheal air column is within
normal limits. Allowing for portable technique the lungs are clear.
No pneumothorax or pleural effusion. No acute osseous abnormality
identified.
IMPRESSION: No acute cardiopulmonary abnormality.

## 2020-10-26 ENCOUNTER — Other Ambulatory Visit: Payer: Self-pay | Admitting: Urology

## 2020-11-01 ENCOUNTER — Other Ambulatory Visit: Payer: Self-pay

## 2020-11-01 ENCOUNTER — Encounter (HOSPITAL_BASED_OUTPATIENT_CLINIC_OR_DEPARTMENT_OTHER): Payer: Self-pay | Admitting: Urology

## 2020-11-01 NOTE — H&P (Signed)
Elevated PSA--New Patient/Consult  HPI: Cory Johnson is a 65 year-old male established patient who is here evaluation of his PSA.   07/10/17: Patient presents today for a PSA of 6.9 2019. No prior history of elevated PSA. No family history of prostate cancer.   09/09/18: He has a history of transient PSA elevation in 3/19 of 6.9. This was repeated 1 month later and found to be 3.42. It was then checked again in 7/19 and found to be 6.1. Currently denies any bone pain or unexplained weight loss. He has not had any UTIs or prostatitis.   10/07/18: When he was here for his last appointment I obtained a screening PSA. In addition a urine culture was performed and was found to be positive. His PSA was noted to be elevated however this was performed during what appears to be an active, culture proven UTI.   05/07/19: Cory Johnson came in today with his wife. Has a history of an elevated PSA that has varied wildly but seems to be associated with UTIs. He is experiencing voiding symptoms that have been present for approximately 4-5 months. He is having some daytime frequency but is primarily bothered by nocturia 4-5 times and urinary urgency. He currently denies any fever, dysuria or flank pain. No hematuria.   06/11/19: Patient with above-noted history. He presents today with persistent symptoms. He was treated empirically at prior office visit with extended course of Cipro, which he remains on. Urine culture ultimately showed mixed growth, with no sensitivities noted. Today he complains of persistent nocturia, urinary urgency, and urge incontinence. He states that symptoms are more bothersome at night. He is having nocturia times 4-5. He is having multiple episodes of leakage and his wife is having to change pads and bed sheets nightly. He does have some urgency during the day, as well. However, he is better able to make it to the restroom in time. He has frequency during the day about every 2-3 hours. He rarely  has leakage during the day. He denies dysuria. No complaints of gross hematuria. He generally feels he is emptying his bladder adequately. He states that his flow of stream is fair. No complaints of straining or intermittency of his stream. No complaints of fevers or chills. He is scheduled for repeat PSA in the upcoming weeks.   His last PSA was performed 04/02/2019. The last PSA value was 11.4.   The patient states he does not take 5 alpha reductase inhibitor medication. He has not undergone a prior prostate biopsy. Patient does not have a family history of prostate cancer. The patient complains of lower urinary tract symptom(s) that include frequency, urgency, and nocturia. X4-5 times per night. He does not have a history of prostatitis.   -03/26/20-patient with history of nocturia urge incontinence and BPH with slightly elevated residual. Also history of increased PSA. His urinary symptoms have been managed with Myrbetriq 25 mg daily. He has noted worsening urinary urgency and occasional dysuria. The patient over the last year so has had worsening cognitive dementia and probable diagnosis of Parkinson's disease and has developed a significant intention tremor.. Last PSA elevated to 11.4 in January 2021 he has not had prostate biopsy.  Postvoid residual is elevated at 111 cc. Micro urinalysis today reveals greater than 60 WBCs and bacteria was sent for culture.   -04/15/20-patient with history of nocturia and urge incontinence with Parkinson's disease and BPH. Read recent enterococcus UTI treated with Levaquin back on 03/26/2020. He was initiated on tamsulosin  0.4 mg daily and has noted some increased flow rate since initiation of medication. Tolerating medications satisfactorily. Here for follow-up urinalysis and postvoid residual.  The patient also has used generic sildenafil in the past for erectile dysfunction and wanted a a prescription for this today.  Postvoid residual equals: 102 cc  Micro  urinalysis shows: 10-20 WBCs and few bacteria culture is sent  -05/28/20-patient with history of nocturia and urge incontinence. Has been on tamsulosin 0.4 mg daily. Recently noted to have some nocturia and a few bacteria but culture was negative on 04/15/2020.  Cory Johnson urinalysis today continues showed 20-40 WBCs and many bacteria and culture was repeated today.  Urine C&S will be sent today, disposition accordingly regarding need for antibiotics. I have ordered CT scan of abdomen pelvis as urine is not clearing with antibiotics to look for upper tract urinary source. Will see back after CT scan consider cysto after review of CT. I will let them know about urine culture today and if antibiotics are needed.  -07/02/20-patient with history of nocturia and urge incontinence and recurrent UTI. Had recent urine culture on 05/28/2020 and showed more than 3 organisms suggestive of voided contaminant. Subsequently has had a CT scan of the abdomen pelvis on 06/21/2020. This showed nonobstructing renal calculi what appears to be bladder diverticulum and prostatomegaly. See report below.  CLINICAL DATA: Cystitis. History of Parkinson's. BPH. History renal  stones. Back pain. Nonsmoker.   EXAM:  CT ABDOMEN AND PELVIS WITHOUT CONTRAST   TECHNIQUE:  Multidetector CT imaging of the abdomen and pelvis was performed  following the standard protocol without IV contrast.   COMPARISON: 12/25/2005   FINDINGS:  Mild degradation secondary to patient arm position, not raised above  the head.   Lower chest: Clear lung bases. Normal heart size without pericardial  or pleural effusion.   Hepatobiliary: Anterior right hepatic lobe 1.6 cm cyst or complex  cyst on 20/2 is similar to on the prior.   The previously described hemangioma is not readily apparent.  Prominent right lobe of the liver. Normal gallbladder, without  biliary ductal dilatation.   Pancreas: Normal, without mass or ductal dilatation.   Spleen:  Normal in size, without focal abnormality.   Adrenals/Urinary Tract: Normal adrenal glands. Interpolar left renal  6 mm calculus. No right renal calculi. No hydronephrosis. Pelvic  calcifications are all favored to be phleboliths. No convincing  evidence of ureteric stone.   No bladder calculi. The prostate impresses into the urinary bladder.  Bladder dome outpouching including on sagittal image 89 likely  represents a diverticulum. Suspicion of wall thickening within the  diverticulum including on 53/2. Possible mild surrounding edema.   Stomach/Bowel: Normal stomach, without wall thickening. Normal large  and small bowel loops.   Vascular/Lymphatic: Aortic atherosclerosis. No abdominopelvic  adenopathy.   Reproductive: Moderate prostatomegaly.   Other: No significant free fluid.   Musculoskeletal: Partial fusion of both sacroiliac joints. Loss of  intervertebral disc height at L4-5 and L5-S1. Flowing syndesmophytes  throughout the upper lumbar spine.   IMPRESSION:  1. Mild degradation secondary to patient arm position.  2. Bladder dome outpouching consistent with diverticulum. Wall  thickening of and suggestion of edema surrounding the diverticulum  suspicious for cystitis/bladder diverticulitis.  3. Left nephrolithiasis, without obstructive uropathy.  4. Prostatomegaly.  5. Aortic Atherosclerosis (ICD10-I70.0).  6. Ankylosing spondylitis.   -09/21/20-patient with history of Parkinson's disease, BPH and elevated residual. Also had recurrent urinary tract infections. Most recent culture grew beta-hemolytic strep was treated  with amoxicillin. Antibiotics initially help symptoms but has had recurrent symptoms since finishing the antibiotic. Here now for follow-up.  Micro urinalysis shows 20-40 WBCs no significant bacteria culture was sent.  Postvoid residual equals: 187 cc  Urine will be sent for culture, initiated Levaquin 500 mg daily until C&S results known. I am going to see  him back in proximally 2 weeks for follow-up cysto after completion of his antibiotics.  -10/25/20-patient with history of BPH and Parkinson's disease and elevated residual. Continued to have significant pyuria but culture was negative on 09/21/2020. He has been prescribed Levaquin however the patient did not take the medicine as he was fearful of some potential side effects. He is here now for follow-up.  Urinalysis today looks improved with 6-10 WBCs minimal bacteria.  Cysto is performed today and shows: Fairly narrow concentric bulbous urethral stricture. Unable to pass the scope. I did not attempt dilation.     ALLERGIES: None   MEDICATIONS: Finasteride 5 mg tablet 1 tablet PO Daily  Tamsulosin Hcl 0.4 mg capsule 1 capsule PO Q HS  Hydrocodone-Acetaminophen  Sildenafil Citrate 20 mg tablet Take 1-5 tabs po qd prn ED     GU PSH: None   NON-GU PSH: None   GU PMH: BPH w/LUTS (Stable) - 09/21/2020, - 07/02/2020, - 05/28/2020, - 04/15/2020, - 03/26/2020 (Stable), He has BPH by exam but does not have any obstructive voiding symptoms., - 2020 Incomplete bladder emptying - 09/21/2020 Urinary Urgency - 07/02/2020, He does have urgency with urge incontinence and frequency. I need to rule out a UTI but it sounds as if he may also have some degree of bladder overactivity and may have Parkinson's although that has not been diagnosed yet. I then have recommended a trial of Myrbetriq 25 mg. I will see him back in 4 weeks for reassessment., - 2020 Acute Cystitis/UTI - 06/18/2020, - 05/28/2020, - 03/26/2020 ED due to arterial insufficiency - 04/15/2020 Urge incontinence - 04/15/2020, - 03/26/2020, - 06/11/2019 Elevated PSA - 06/11/2019, - 05/07/2019, I am going to repeat his PSA and then determine his follow-up based on the results of his PSA., - 2020 (Worsening), His PSA in 7/19 was noted to be elevated at 6.1. I noted no abnormality on DRE but I have recommended we check a PSA today. His urine did appear to be possibly  infected so I will culture it and if it is infected and his PSA is elevated I would treat his infection and then repeat his PSA., - 2020, - 2019 Nocturia - 06/11/2019 BPH w/o LUTS - 2019    NON-GU PMH: Pyuria/other UA findings - 09/21/2020, (Stable), - 04/15/2020 (Stable), - 05/07/2019, His urine appeared infected and will be cultured., - 2020 Parkinson's disease - 07/02/2020, - 03/26/2020 Arthritis DVT, History    FAMILY HISTORY: 1 Daughter - Daughter 4 sons - Son   SOCIAL HISTORY: Marital Status: Married Preferred Language: English; Ethnicity: Not Hispanic Or Latino; Race: Black or African American Has never drank.  Drinks 1 caffeinated drink per day.    REVIEW OF SYSTEMS:    GU Review Male:   Patient reports frequent urination, hard to postpone urination, burning/ pain with urination, get up at night to urinate, leakage of urine, and stream starts and stops. Patient denies trouble starting your stream, have to strain to urinate , erection problems, and penile pain.  Gastrointestinal (Upper):   Patient denies nausea, vomiting, and indigestion/ heartburn.  Gastrointestinal (Lower):   Patient denies diarrhea and constipation.  Constitutional:  Patient denies fatigue, night sweats, weight loss, and fever.  Skin:   Patient denies skin rash/ lesion and itching.  Eyes:   Patient denies blurred vision and double vision.  Ears/ Nose/ Throat:   Patient denies sore throat and sinus problems.  Hematologic/Lymphatic:   Patient denies swollen glands and easy bruising.  Cardiovascular:   Patient denies leg swelling and chest pains.  Respiratory:   Patient denies cough and shortness of breath.  Endocrine:   Patient denies excessive thirst.  Musculoskeletal:   Patient denies back pain and joint pain.  Neurological:   Patient denies headaches and dizziness.  Psychologic:   Patient denies depression and anxiety.   VITAL SIGNS: None   GU PHYSICAL EXAMINATION:    Anus and Perineum: No hemorrhoids. No  anal stenosis. No rectal fissure, no anal fissure. No edema, no dimple, no perineal tenderness, no anal tenderness.  Scrotum: No lesions. No edema. No cysts. No warts.  Epididymides: Right: no spermatocele, no masses, no cysts, no tenderness, no induration, no enlargement. Left: no spermatocele, no masses, no cysts, no tenderness, no induration, no enlargement.  Testes: No tenderness, no swelling, no enlargement left testes. No tenderness, no swelling, no enlargement right testes. Normal location left testes. Normal location right testes. No mass, no cyst, no varicocele, no hydrocele left testes. No mass, no cyst, no varicocele, no hydrocele right testes.  Urethral Meatus: Normal size. No lesion, no wart, no discharge, no polyp. Normal location.  Penis: Circumcised, no warts, no cracks. No dorsal Peyronie's plaques, no left corporal Peyronie's plaques, no right corporal Peyronie's plaques, no scarring, no warts. No balanitis, no meatal stenosis.   MULTI-SYSTEM PHYSICAL EXAMINATION:    Musculoskeletal: Patient with intention tremor, has some contraction of his right upper extremity as well.     Complexity of Data:  Source Of History:  Patient  Records Review:   Previous Doctor Records, Previous Patient Records  Urine Test Review:   Urinalysis   06/25/19 04/02/19 01/24/19 10/08/18 09/10/18 09/17/17 07/10/17 05/24/17  PSA  Total PSA 2.16 ng/mL 11.40 ng/mL 1.95 ng/mL 4.17 ng/mL 7.09 ng/mL 6.10 ng/dl 2.33 ng/mL 6.9 ng/dl  Free PSA    0.07 ng/mL 3.70 ng/mL     % Free PSA    17 % PSA 52 % PSA       PROCEDURES:         Flexible Cystoscopy - 52000  Risks, benefits, and some of the potential complications of the procedure were discussed at length with the patient including infection, bleeding, voiding discomfort, urinary retention, fever, chills, sepsis, and others. All questions were answered. Informed consent was obtained. Antibiotic prophylaxis was given. Sterile technique and intraurethral  analgesia were used.  Meatus:  Normal size. Normal location. Normal condition.  Urethra:  Concentric bulbous urethral stricture, unable to pass the scope      The lower urinary tract was carefully examined. The procedure was well-tolerated and without complications. Antibiotic instructions were given. Instructions were given to call the office immediately for bloody urine, difficulty urinating, urinary retention, painful or frequent urination, fever, chills, nausea, vomiting or other illness. The patient stated that he understood these instructions and would comply with them.         Urinalysis w/Scope - 81001 Dipstick Dipstick Cont'd Micro  Color: Yellow Bilirubin: Neg mg/dL WBC/hpf: 6 - 62/UQJ  Appearance: Clear Ketones: Neg mg/dL RBC/hpf: 0 - 2/hpf  Specific Gravity: 1.025 Blood: Neg ery/uL Bacteria: Rare (0-9/hpf)  pH: 6.0 Protein: Trace mg/dL Cystals: NS (Not  Seen)  Glucose: Neg mg/dL Urobilinogen: 0.2 mg/dL Casts: NS (Not Seen)    Nitrites: Neg Trichomonas: Not Present    Leukocyte Esterase: 1+ leu/uL Mucous: Present      Epithelial Cells: NS (Not Seen)      Yeast: NS (Not Seen)      Sperm: Not Present    Notes: unspun micro    ASSESSMENT:      ICD-10 Details  1 GU:   Incomplete bladder emptying - R39.14 Chronic, Stable  2 NON-GU:   Unspecified bulbous urethral stricture, male - N35.912 Acute, Complicated Injury  3   Parkinson's disease - G20 Chronic, Stable  4   Pyuria/other UA findings - R82.79 Acute, Complicated Injury   PLAN:            Medications Stop Meds: Cipro  Discontinue: 10/25/2020  - Reason: The medication cycle was completed.  Levaquin 1 tablet PO Daily  Discontinue: 10/25/2020  - Reason: The medication cycle was completed.            Document Letter(s):  Created for Patient: Clinical Summary         Notes:   Urethral cystoscopic findings discussed with wife and patient. Recommended cysto under anesthesia with possible DVIU or urethral dilation. Will  schedule accordingly in the near future. Discussed possible need for urethral Foley for period of time to allow healing if we dilate or incise the stricture.

## 2020-11-01 NOTE — Progress Notes (Signed)
Spoke w/ via phone for pre-op interview--- Patient Cory Johnson and wife Cory Johnson Lab needs dos---- [employer] results------ COVID test -----patient states asymptomatic no test needed Arrive at ------- 0930 NPO after MN NO Solid Food.  Clear liquids from MN until--- Med rec completed Medications to take morning of surgery ----- Carbidopa/ Levadopa Diabetic medication ----- n/a Patient instructed no nail polish to be worn day of surgery Patient instructed to bring photo id and insurance card day of surgery Patient aware to have Driver (ride ) / caregiver    for 24 hours after surgery  Patient Special Instructions ----- Pre-Op special Istructions ----- Patient verbalized understanding of instructions that were given at this phone interview. Patient denies shortness of breath, chest pain, fever, cough at this phone interview.   Anesthesia Review:   PCP: Cardiologist : Chest x-ray : EKG : Echo :03/2017 Stress test: Cardiac Cath :  Activity level:  Sleep Study/ CPAP : Per pt he has sleep apnea but does not wear CPAP. Fasting Blood Sugar :      / Checks Blood Sugar -- times a day:   Blood Thinner/ Instructions /Last Dose: ASA / Instructions/ Last Dose :

## 2020-11-01 NOTE — Progress Notes (Signed)
Patient listed as difficult intubation. Spoke with patient and wife, they stated it was due to DJD in patients back and neck and patient is unable to lay flat. Patient reviewed by Dr. Maple Hudson, ok for surgery at [facility].

## 2020-11-02 ENCOUNTER — Encounter (HOSPITAL_BASED_OUTPATIENT_CLINIC_OR_DEPARTMENT_OTHER): Payer: Self-pay | Admitting: Urology

## 2020-11-02 ENCOUNTER — Ambulatory Visit (HOSPITAL_BASED_OUTPATIENT_CLINIC_OR_DEPARTMENT_OTHER): Payer: Medicare Other | Admitting: Anesthesiology

## 2020-11-02 ENCOUNTER — Other Ambulatory Visit: Payer: Self-pay

## 2020-11-02 ENCOUNTER — Ambulatory Visit (HOSPITAL_BASED_OUTPATIENT_CLINIC_OR_DEPARTMENT_OTHER)
Admission: RE | Admit: 2020-11-02 | Discharge: 2020-11-02 | Disposition: A | Discharge status: Home / Self Care | Payer: Medicare Other | Attending: Urology | Admitting: Urology

## 2020-11-02 ENCOUNTER — Encounter (HOSPITAL_BASED_OUTPATIENT_CLINIC_OR_DEPARTMENT_OTHER)
Admission: RE | Disposition: A | Discharge status: Home / Self Care | Payer: Self-pay | Source: Home / Self Care | Attending: Urology

## 2020-11-02 DIAGNOSIS — N401 Enlarged prostate with lower urinary tract symptoms: Secondary | ICD-10-CM | POA: Diagnosis not present

## 2020-11-02 DIAGNOSIS — R351 Nocturia: Secondary | ICD-10-CM | POA: Insufficient documentation

## 2020-11-02 DIAGNOSIS — N35812 Other urethral bulbous stricture, male: Secondary | ICD-10-CM | POA: Diagnosis present

## 2020-11-02 DIAGNOSIS — Z86718 Personal history of other venous thrombosis and embolism: Secondary | ICD-10-CM | POA: Insufficient documentation

## 2020-11-02 DIAGNOSIS — R350 Frequency of micturition: Secondary | ICD-10-CM | POA: Diagnosis not present

## 2020-11-02 DIAGNOSIS — R3915 Urgency of urination: Secondary | ICD-10-CM | POA: Insufficient documentation

## 2020-11-02 DIAGNOSIS — Z87440 Personal history of urinary (tract) infections: Secondary | ICD-10-CM | POA: Diagnosis not present

## 2020-11-02 DIAGNOSIS — G20 Parkinson's disease: Secondary | ICD-10-CM | POA: Diagnosis not present

## 2020-11-02 DIAGNOSIS — N323 Diverticulum of bladder: Secondary | ICD-10-CM | POA: Diagnosis not present

## 2020-11-02 DIAGNOSIS — R338 Other retention of urine: Secondary | ICD-10-CM | POA: Insufficient documentation

## 2020-11-02 DIAGNOSIS — Z87442 Personal history of urinary calculi: Secondary | ICD-10-CM | POA: Insufficient documentation

## 2020-11-02 DIAGNOSIS — Z79899 Other long term (current) drug therapy: Secondary | ICD-10-CM | POA: Insufficient documentation

## 2020-11-02 HISTORY — DX: Parkinson's disease: G20

## 2020-11-02 HISTORY — DX: Unspecified osteoarthritis, unspecified site: M19.90

## 2020-11-02 HISTORY — PX: CYSTOSCOPY WITH URETHRAL DILATATION: SHX5125

## 2020-11-02 HISTORY — DX: Parkinson's disease without dyskinesia, without mention of fluctuations: G20.A1

## 2020-11-02 SURGERY — CYSTOSCOPY, WITH URETHRAL DILATION
Anesthesia: General | Site: Urethra

## 2020-11-02 MED ORDER — STERILE WATER FOR IRRIGATION IR SOLN
Status: DC | PRN
  Administered 2020-11-02: 3000 mL

## 2020-11-02 MED ORDER — LIDOCAINE HCL (CARDIAC) PF 100 MG/5ML IV SOSY
PREFILLED_SYRINGE | INTRAVENOUS | Status: DC | PRN
  Administered 2020-11-02: 100 mg via INTRAVENOUS

## 2020-11-02 MED ORDER — DEXAMETHASONE SODIUM PHOSPHATE 4 MG/ML IJ SOLN
INTRAMUSCULAR | Status: DC | PRN
  Administered 2020-11-02: 5 mg via INTRAVENOUS

## 2020-11-02 MED ORDER — ONDANSETRON HCL 4 MG/2ML IJ SOLN
INTRAMUSCULAR | Status: AC
  Filled 2020-11-02: qty 2

## 2020-11-02 MED ORDER — FENTANYL CITRATE (PF) 100 MCG/2ML IJ SOLN
INTRAMUSCULAR | Status: AC
  Filled 2020-11-02: qty 2

## 2020-11-02 MED ORDER — ACETAMINOPHEN 325 MG PO TABS: 325.0000 mg | ORAL_TABLET | ORAL | Status: DC | PRN

## 2020-11-02 MED ORDER — EPHEDRINE 5 MG/ML INJ
INTRAVENOUS | Status: AC
  Filled 2020-11-02: qty 5

## 2020-11-02 MED ORDER — DEXAMETHASONE SODIUM PHOSPHATE 10 MG/ML IJ SOLN
INTRAMUSCULAR | Status: AC
  Filled 2020-11-02: qty 1

## 2020-11-02 MED ORDER — ONDANSETRON HCL 4 MG/2ML IJ SOLN: 4.0000 mg | Freq: Once | INTRAMUSCULAR | Status: DC | PRN

## 2020-11-02 MED ORDER — EPHEDRINE SULFATE-NACL 50-0.9 MG/10ML-% IV SOSY
PREFILLED_SYRINGE | INTRAVENOUS | Status: DC | PRN
  Administered 2020-11-02: 10 mg via INTRAVENOUS

## 2020-11-02 MED ORDER — GLYCOPYRROLATE PF 0.2 MG/ML IJ SOSY
PREFILLED_SYRINGE | INTRAMUSCULAR | Status: AC
  Filled 2020-11-02: qty 1

## 2020-11-02 MED ORDER — LACTATED RINGERS IV SOLN: INTRAVENOUS | Status: DC

## 2020-11-02 MED ORDER — ONDANSETRON HCL 4 MG/2ML IJ SOLN
INTRAMUSCULAR | Status: DC | PRN
  Administered 2020-11-02: 4 mg via INTRAVENOUS

## 2020-11-02 MED ORDER — MIDAZOLAM HCL 5 MG/5ML IJ SOLN
INTRAMUSCULAR | Status: DC | PRN
  Administered 2020-11-02: 1 mg via INTRAVENOUS

## 2020-11-02 MED ORDER — FENTANYL CITRATE (PF) 100 MCG/2ML IJ SOLN: 25.0000 ug | INTRAMUSCULAR | Status: DC | PRN

## 2020-11-02 MED ORDER — MIDAZOLAM HCL 2 MG/2ML IJ SOLN
INTRAMUSCULAR | Status: AC
  Filled 2020-11-02: qty 2

## 2020-11-02 MED ORDER — OXYCODONE HCL 5 MG/5ML PO SOLN: 5.0000 mg | Freq: Once | ORAL | Status: DC | PRN

## 2020-11-02 MED ORDER — PROPOFOL 10 MG/ML IV BOLUS
INTRAVENOUS | Status: DC | PRN
  Administered 2020-11-02: 100 mg via INTRAVENOUS

## 2020-11-02 MED ORDER — SODIUM CHLORIDE 0.9 % IR SOLN: Status: DC | PRN

## 2020-11-02 MED ORDER — ACETAMINOPHEN 160 MG/5ML PO SOLN: 325.0000 mg | ORAL | Status: DC | PRN

## 2020-11-02 MED ORDER — GLYCOPYRROLATE PF 0.2 MG/ML IJ SOSY
PREFILLED_SYRINGE | INTRAMUSCULAR | Status: DC | PRN
  Administered 2020-11-02 (×2): .1 mg via INTRAVENOUS

## 2020-11-02 MED ORDER — LIDOCAINE HCL (PF) 2 % IJ SOLN
INTRAMUSCULAR | Status: AC
  Filled 2020-11-02: qty 5

## 2020-11-02 MED ORDER — FENTANYL CITRATE (PF) 100 MCG/2ML IJ SOLN
INTRAMUSCULAR | Status: DC | PRN
  Administered 2020-11-02: 50 ug via INTRAVENOUS
  Administered 2020-11-02 (×2): 25 ug via INTRAVENOUS

## 2020-11-02 MED ORDER — PROPOFOL 10 MG/ML IV BOLUS
INTRAVENOUS | Status: AC
  Filled 2020-11-02: qty 20

## 2020-11-02 MED ORDER — CEFAZOLIN SODIUM-DEXTROSE 2-4 GM/100ML-% IV SOLN
INTRAVENOUS | Status: AC
  Filled 2020-11-02: qty 100

## 2020-11-02 MED ORDER — CEFAZOLIN SODIUM-DEXTROSE 2-4 GM/100ML-% IV SOLN
2.0000 g | INTRAVENOUS | Status: AC
  Administered 2020-11-02: 2 g via INTRAVENOUS

## 2020-11-02 MED ORDER — OXYCODONE HCL 5 MG PO TABS
5.0000 mg | ORAL_TABLET | Freq: Once | ORAL | Status: DC | PRN
Start: 2020-11-02 — End: 2020-11-02

## 2020-11-02 SURGICAL SUPPLY — 39 items
BAG DRAIN URO-CYSTO SKYTR STRL (DRAIN) ×3 IMPLANT
BAG DRN RND TRDRP ANRFLXCHMBR (UROLOGICAL SUPPLIES) ×2
BAG DRN UROCATH (DRAIN) ×2
BAG URINE DRAIN 2000ML AR STRL (UROLOGICAL SUPPLIES) ×3 IMPLANT
BAG URINE LEG 500ML (DRAIN) IMPLANT
BALLN NEPHROSTOMY (BALLOONS)
BALLOON NEPHROSTOMY (BALLOONS) IMPLANT
CATH FOLEY 2W COUNCIL 20FR 5CC (CATHETERS) IMPLANT
CATH FOLEY 2W COUNCIL 5CC 18FR (CATHETERS) IMPLANT
CATH FOLEY 2WAY SLVR  5CC 18FR (CATHETERS) ×3
CATH FOLEY 2WAY SLVR 30CC 20FR (CATHETERS) IMPLANT
CATH FOLEY 2WAY SLVR 5CC 18FR (CATHETERS) ×2 IMPLANT
CATH FOLEY 3WAY 30CC 22FR (CATHETERS) IMPLANT
CATH ROBINSON RED A/P 14FR (CATHETERS) ×3 IMPLANT
CATH URET 5FR 28IN CONE TIP (BALLOONS)
CATH URET 5FR 28IN OPEN ENDED (CATHETERS) IMPLANT
CATH URET 5FR 70CM CONE TIP (BALLOONS) IMPLANT
CLOTH BEACON ORANGE TIMEOUT ST (SAFETY) ×6 IMPLANT
GLOVE SURG ENC MOIS LTX SZ7.5 (GLOVE) ×3 IMPLANT
GLOVE SURG UNDER POLY LF SZ6.5 (GLOVE) ×3 IMPLANT
GLOVE SURG UNDER POLY LF SZ7 (GLOVE) ×3 IMPLANT
GLOVE SURG UNDER POLY LF SZ7.5 (GLOVE) ×3 IMPLANT
GOWN STRL REUS W/TWL LRG LVL3 (GOWN DISPOSABLE) ×9 IMPLANT
GOWN STRL REUS W/TWL XL LVL3 (GOWN DISPOSABLE) ×6 IMPLANT
GUIDEWIRE ANG ZIPWIRE 038X150 (WIRE) IMPLANT
GUIDEWIRE STR DUAL SENSOR (WIRE) ×3 IMPLANT
HOLDER FOLEY CATH W/STRAP (MISCELLANEOUS) ×3 IMPLANT
KIT TURNOVER CYSTO (KITS) ×3 IMPLANT
MANIFOLD NEPTUNE II (INSTRUMENTS) ×3 IMPLANT
NS IRRIG 500ML POUR BTL (IV SOLUTION) IMPLANT
PACK CYSTO (CUSTOM PROCEDURE TRAY) ×3 IMPLANT
PLUG CATH AND CAP STER (CATHETERS) ×3 IMPLANT
SYR 20ML LL LF (SYRINGE) IMPLANT
SYR 30ML LL (SYRINGE) IMPLANT
SYR TOOMEY IRRIG 70ML (MISCELLANEOUS)
SYRINGE TOOMEY IRRIG 70ML (MISCELLANEOUS) IMPLANT
TUBE CONNECTING 12X1/4 (SUCTIONS) ×3 IMPLANT
TUBING UROLOGY SET (TUBING) IMPLANT
WATER STERILE IRR 3000ML UROMA (IV SOLUTION) ×3 IMPLANT

## 2020-11-02 NOTE — Interval H&P Note (Signed)
History and Physical Interval Note:  11/02/2020 10:54 AM  Cory Johnson  has presented today for surgery, with the diagnosis of BULBOUS URETHRAL STRICTURE.  The various methods of treatment have been discussed with the patient and family. After consideration of risks, benefits and other options for treatment, the patient has consented to  Procedure(s) with comments: CYSTOSCOPY WITH DIRECT VISION INTERNAL URETHROTOMY (N/A) - 45 MINS CYSTOSCOPY WITH URETHRAL DILATATION (N/A) as a surgical intervention.  The patient's history has been reviewed, patient examined, no change in status, stable for surgery.  I have reviewed the patient's chart and labs.  Questions were answered to the patient's satisfaction.     [name]

## 2020-11-02 NOTE — Anesthesia Preprocedure Evaluation (Addendum)
Anesthesia Evaluation  Patient identified by MRN, date of birth, ID band Patient awake    History of Anesthesia Complications (+) DIFFICULT AIRWAY and history of anesthetic complications  Airway Mallampati: IV       Dental  (+) Poor Dentition, Missing   Pulmonary    Pulmonary exam normal        Cardiovascular negative cardio ROS Normal cardiovascular exam     Neuro/Psych  Neuromuscular disease    GI/Hepatic negative GI ROS, Neg liver ROS,   Endo/Other  negative endocrine ROS  Renal/GU negative Renal ROS     Musculoskeletal   Abdominal Normal abdominal exam  (+)   Peds  Hematology   Anesthesia Other Findings   Reproductive/Obstetrics                            Anesthesia Physical Anesthesia Plan  ASA: 3  Anesthesia Plan: General   Post-op Pain Management:    Induction:   PONV Risk Score and Plan:   Airway Management Planned: LMA  Additional Equipment: None  Intra-op Plan:   Post-operative Plan: Extubation in OR  Informed Consent: I have reviewed the patients History and Physical, chart, labs and discussed the procedure including the risks, benefits and alternatives for the proposed anesthesia with the patient or authorized representative who has indicated his/her understanding and acceptance.       Plan Discussed with: CRNA  Anesthesia Plan Comments:         Anesthesia Quick Evaluation                                  Anesthesia Evaluation  Patient identified by MRN, date of birth, ID band Patient awake  General Assessment Comment:INTUBATION 13' Laryngoscope size: Glidescope number 4. Grade View: Grade II Tube type: Oral Number of attempts: 1 Airway Equipment and Method: stylet,  oral airway and video-laryngoscopy Placement Confirmation: ETT inserted through vocal cords under direct vision,  positive ETCO2 and breath sounds checked- equal and  bilateral Secured at: 23 cm Tube secured with: Tape Dental Injury: Teeth and Oropharynx as per pre-operative assessment  Difficulty Due To: Difficult Airway- due to reduced neck mobility  Reviewed: Allergy & Precautions, H&P , NPO status , Patient's Chart, lab work & pertinent test results  History of Anesthesia Complications (+) DIFFICULT AIRWAY and history of anesthetic complications  Airway Mallampati: III  TM Distance: >3 FB Neck ROM: Limited   Comment: NO  ROM Dental no notable dental hx. (+) Missing, Poor Dentition NO ROM:   Pulmonary neg pulmonary ROS,    Pulmonary exam normal breath sounds clear to auscultation       Cardiovascular Exercise Tolerance: Good negative cardio ROS Normal cardiovascular exam Rhythm:Regular Rate:Normal     Neuro/Psych Seizures -, Well Controlled,  negative neurological ROS  negative psych ROS   GI/Hepatic negative GI ROS, Neg liver ROS,   Endo/Other  negative endocrine ROS  Renal/GU negative Renal ROS  negative genitourinary   Musculoskeletal negative musculoskeletal ROS (+) Arthritis , Osteoarthritis,    Abdominal   Peds negative pediatric ROS (+)  Hematology negative hematology ROS (+)   Anesthesia Other Findings Severe arthritis limited neck mobility, III airway   Reproductive/Obstetrics negative OB ROS                          Anesthesia Physical Anesthesia Plan  ASA: III  Anesthesia Plan: MAC and Regional   Post-op Pain Management:  Regional for Post-op pain   Induction:   PONV Risk Score and Plan:   Airway Management Planned: Nasal Cannula and Simple Face Mask  Additional Equipment:   Intra-op Plan:   Post-operative Plan:   Informed Consent: I have reviewed the patients History and Physical, chart, labs and discussed the procedure including the risks, benefits and alternatives for the proposed anesthesia with the patient or authorized representative who has indicated  his/her understanding and acceptance.       Plan Discussed with: CRNA, Anesthesiologist and Surgeon  Anesthesia Plan Comments: (Discussed with patient and wife that regional block with MAC is the best alternative for finger surgery as it avoids any airway manipulation potentially and can help with postoperative pain as well. They understand and gave their consent to this plan verbally)       Anesthesia Quick Evaluation

## 2020-11-02 NOTE — Anesthesia Postprocedure Evaluation (Signed)
Anesthesia Post Note  Patient: Cory Johnson  Procedure(s) Performed: CYSTOSCOPY WITH BULBAR URETHRAL DILATATION (Urethra)     Patient location during evaluation: Phase II Anesthesia Type: General Level of consciousness: awake Pain management: pain level controlled Vital Signs Assessment: post-procedure vital signs reviewed and stable Respiratory status: spontaneous breathing Cardiovascular status: stable Postop Assessment: no apparent nausea or vomiting Anesthetic complications: no   No notable events documented.  Last Vitals:  Vitals:   11/02/20 1300 11/02/20 1320  BP: (!) 147/93   Pulse: (!) 47 (!) 56  Resp: 14 15  Temp:  (!) 36.3 C  SpO2: 100% 99%    Last Pain:  Vitals:   11/02/20 1320  TempSrc: Oral  PainSc:                  Caren Macadam

## 2020-11-02 NOTE — Anesthesia Procedure Notes (Signed)
Procedure Name: LMA Insertion Date/Time: 11/02/2020 11:35 AM Performed by: Bishop Limbo, CRNA Pre-anesthesia Checklist: Patient identified, Emergency Drugs available, Suction available and Patient being monitored Patient Re-evaluated:Patient Re-evaluated prior to induction Oxygen Delivery Method: Circle System Utilized Preoxygenation: Pre-oxygenation with 100% oxygen Induction Type: IV induction Ventilation: Mask ventilation without difficulty LMA: LMA inserted LMA Size: 4.0 Number of attempts: 1 Placement Confirmation: positive ETCO2 Tube secured with: Tape Dental Injury: Teeth and Oropharynx as per pre-operative assessment

## 2020-11-02 NOTE — Op Note (Signed)
Preoperative diagnosis:  1.  Bulbous urethral stricture  Postoperative diagnosis: 1.  Bulbous urethral stricture  Procedure(s): 1.  Cystoscopy, dilation of bulbous urethral stricture Foley catheter insertion  Surgeon: Dr. Karoline Caldwell  Anesthesia: General  Complications: None  EBL: Minimal  Specimens: None  Disposition of specimens: Not applicable  Intraoperative findings: Concentric very short but dense bulbous urethral stricture.  Able to pass guidewire and dilate utilizing Goodwin sounds up to 24 [place].  Remaining of urethra and prostate were open had mild to moderate bilobar prostatic hypertrophy.  Bladder appeared grossly normal except for trabeculation and a bladder diverticulum.  Indication: Patient is a 65 year old African-American male had history of BPH and Parkinson's disease.  Is had incomplete bladder emptying.  Cystoscopy in the office confirmed a dense bulbous urethral stricture.  He presents at this time undergo cystoscopy and dilation of bulbous urethral stricture and cystoscopy  Description of procedure:  After obtaining form consent for the patient is taken the major cystoscopy suite placed under general anesthesia.  He is placed in the dorsolithotomy position genitalia prepped and draped in usual sterile fashion.  Proper pause and timeout was performed.  21 French cystoscope was advanced into the urethra with the above-noted findings.  Scope could not be passed due to the tight nature of the stricture.  Subsequently utilized a sensor wire to pass under direct vision retrograde into the bladder.  This was confirmed coiling the wire in the bladder under fluoroscopy.  Cystoscope was removed leaving the guidewire in place.  Utilizing [employer] starting at [employer] I was able to easily pass the dilators over the guidewire and into the bladder with no significant resistance.  I sequentially dilated up to 24 [place].  The dilators were then removed leaving the  guidewire in place.  I performed cystoscopy alongside the guidewire was able to easily pass the 21 [place] scope alongside the guidewire wire and into the bladder.  The bladder was thoroughly inspected revealed some significant trabeculation but no mucosal lesions.  There was also a diverticulum noted within the bladder.  The scope was backed down to the urethral area and this looked to be well dilated except for a couple of small areas of ragged tissue which I was able to remove utilizing alligator graspers.  I felt that Foley catheter to be indicated to allow healing over the Foley.  A 18 French Foley was then placed to the bladder without difficulty urine was clear.  10 cc placed in the balloon.  Procedure was terminated he was awakened from anesthesia and taken back to the recovery room in stable condition.  No immediate complication from the procedure.

## 2020-11-02 NOTE — Transfer of Care (Signed)
Immediate Anesthesia Transfer of Care Note  Patient: Cory Johnson  Procedure(s) Performed: Procedure(s) (LRB): CYSTOSCOPY WITH BULBAR URETHRAL DILATATION (N/A)  Patient Location: PACU  Anesthesia Type: General  Level of Consciousness: awake, sedated, patient cooperative and responds to stimulation  Airway & Oxygen Therapy: Patient Spontanous Breathing and Patient connected to Bowman 02 and soft FM  Post-op Assessment: Report given to PACU RN, Post -op Vital signs reviewed and stable and Patient moving all extremities  Post vital signs: Reviewed and stable  Complications: No apparent anesthesia complications

## 2020-11-02 NOTE — Discharge Instructions (Addendum)

## 2020-11-03 ENCOUNTER — Encounter (HOSPITAL_BASED_OUTPATIENT_CLINIC_OR_DEPARTMENT_OTHER): Payer: Self-pay | Admitting: Urology

## 2020-11-16 ENCOUNTER — Ambulatory Visit: Payer: Medicare Other | Admitting: Podiatry

## 2020-12-07 ENCOUNTER — Ambulatory Visit: Payer: Medicare Other | Admitting: Podiatry

## 2020-12-20 ENCOUNTER — Encounter: Payer: Medicare Other | Admitting: Family Medicine

## 2021-01-10 ENCOUNTER — Encounter: Payer: Medicare Other | Admitting: Family Medicine

## 2021-01-10 DIAGNOSIS — R7989 Other specified abnormal findings of blood chemistry: Secondary | ICD-10-CM

## 2021-01-10 DIAGNOSIS — Z0000 Encounter for general adult medical examination without abnormal findings: Secondary | ICD-10-CM

## 2021-01-10 DIAGNOSIS — D649 Anemia, unspecified: Secondary | ICD-10-CM

## 2021-01-10 DIAGNOSIS — Z13220 Encounter for screening for lipoid disorders: Secondary | ICD-10-CM

## 2021-01-10 DIAGNOSIS — R972 Elevated prostate specific antigen [PSA]: Secondary | ICD-10-CM

## 2021-01-14 ENCOUNTER — Other Ambulatory Visit: Payer: Self-pay

## 2021-01-14 ENCOUNTER — Ambulatory Visit: Payer: Medicare Other | Admitting: Podiatry

## 2021-01-14 ENCOUNTER — Encounter: Payer: Self-pay | Admitting: Podiatry

## 2021-01-14 DIAGNOSIS — M79674 Pain in right toe(s): Secondary | ICD-10-CM | POA: Diagnosis not present

## 2021-01-14 DIAGNOSIS — B351 Tinea unguium: Secondary | ICD-10-CM | POA: Diagnosis not present

## 2021-01-14 DIAGNOSIS — L603 Nail dystrophy: Secondary | ICD-10-CM | POA: Diagnosis not present

## 2021-01-14 DIAGNOSIS — D696 Thrombocytopenia, unspecified: Secondary | ICD-10-CM

## 2021-01-14 NOTE — Progress Notes (Signed)
This patient returns to my office for at risk foot care.  This patient requires this care by a professional since this patient will be at risk due to having thrombocytopenia.  This patient is unable to cut nails himself since the patient cannot reach his nails.These nails are painful walking and wearing shoes. He presents to the office with his wife. This patient presents for at risk foot care today.  General Appearance  Alert, conversant and in no acute stress.  Vascular  Dorsalis pedis and posterior tibial  pulses are palpable  bilaterally.  Capillary return is within normal limits  bilaterally. Temperature is within normal limits  bilaterally.  Neurologic  Senn-Weinstein monofilament wire test absent   bilaterally. Muscle power within normal limits bilaterally.  Nails Thick disfigured discolored nails with subungual debris  from hallux to fifth toes bilaterally. No evidence of bacterial infection or drainage bilaterally.  Orthopedic  No limitations of motion  feet .  No crepitus or effusions noted.  No bony pathology .  HAV  B/L.  Hammer toes  2-5  B/L.  Midfoot  DJD  B/L.  Skin  normotropic skin with no porokeratosis noted bilaterally.  No signs of infections or ulcers noted.     Onychomycosis  Pain in right toes  Pain in left toes  Consent was obtained for treatment procedures.   Mechanical debridement of nails 1-5  bilaterally performed with a nail nipper.  Filed with dremel without incident.    Return office visit     4  months                 Told patient to return for periodic foot care and evaluation due to potential at risk complications.   Helane Gunther DPM

## 2021-01-18 ENCOUNTER — Ambulatory Visit: Payer: Medicare Other | Admitting: Family Medicine

## 2021-03-22 DIAGNOSIS — M5136 Other intervertebral disc degeneration, lumbar region: Secondary | ICD-10-CM | POA: Diagnosis not present

## 2021-03-22 DIAGNOSIS — G894 Chronic pain syndrome: Secondary | ICD-10-CM | POA: Diagnosis not present

## 2021-03-22 DIAGNOSIS — M5030 Other cervical disc degeneration, unspecified cervical region: Secondary | ICD-10-CM | POA: Diagnosis not present

## 2021-03-22 DIAGNOSIS — Z79891 Long term (current) use of opiate analgesic: Secondary | ICD-10-CM | POA: Diagnosis not present

## 2021-04-11 DIAGNOSIS — G20 Parkinson's disease: Secondary | ICD-10-CM | POA: Diagnosis not present

## 2021-04-11 DIAGNOSIS — R3914 Feeling of incomplete bladder emptying: Secondary | ICD-10-CM | POA: Diagnosis not present

## 2021-04-11 DIAGNOSIS — R8279 Other abnormal findings on microbiological examination of urine: Secondary | ICD-10-CM | POA: Diagnosis not present

## 2021-04-21 ENCOUNTER — Encounter: Payer: Medicare Other | Admitting: Family Medicine

## 2021-04-29 ENCOUNTER — Encounter: Payer: Medicare Other | Admitting: Family Medicine

## 2021-05-04 ENCOUNTER — Encounter: Payer: Self-pay | Admitting: Family Medicine

## 2021-05-04 ENCOUNTER — Ambulatory Visit (INDEPENDENT_AMBULATORY_CARE_PROVIDER_SITE_OTHER): Payer: Medicare Other | Admitting: Family Medicine

## 2021-05-04 VITALS — BP 108/60 | HR 74 | Temp 97.9°F | Resp 16 | Ht 74.0 in | Wt 168.2 lb

## 2021-05-04 DIAGNOSIS — F02818 Dementia in other diseases classified elsewhere, unspecified severity, with other behavioral disturbance: Secondary | ICD-10-CM | POA: Diagnosis not present

## 2021-05-04 DIAGNOSIS — R7303 Prediabetes: Secondary | ICD-10-CM

## 2021-05-04 DIAGNOSIS — Z87898 Personal history of other specified conditions: Secondary | ICD-10-CM

## 2021-05-04 DIAGNOSIS — Z0000 Encounter for general adult medical examination without abnormal findings: Secondary | ICD-10-CM | POA: Diagnosis not present

## 2021-05-04 DIAGNOSIS — G3183 Dementia with Lewy bodies: Secondary | ICD-10-CM | POA: Diagnosis not present

## 2021-05-04 DIAGNOSIS — N401 Enlarged prostate with lower urinary tract symptoms: Secondary | ICD-10-CM | POA: Diagnosis not present

## 2021-05-04 LAB — COMPREHENSIVE METABOLIC PANEL
ALT: 8 U/L (ref 0–53)
AST: 15 U/L (ref 0–37)
Albumin: 4.2 g/dL (ref 3.5–5.2)
Alkaline Phosphatase: 57 U/L (ref 39–117)
BUN: 16 mg/dL (ref 6–23)
CO2: 38 mEq/L — ABNORMAL HIGH (ref 19–32)
Calcium: 9.1 mg/dL (ref 8.4–10.5)
Chloride: 102 mEq/L (ref 96–112)
Creatinine, Ser: 0.91 mg/dL (ref 0.40–1.50)
GFR: 87.12 mL/min (ref >60.00)
Glucose, Bld: 82 mg/dL (ref 70–99)
Potassium: 4.1 mEq/L (ref 3.5–5.1)
Sodium: 139 mEq/L (ref 135–145)
Total Bilirubin: 1.1 mg/dL (ref 0.2–1.2)
Total Protein: 7 g/dL (ref 6.0–8.3)

## 2021-05-04 LAB — HEMOGLOBIN A1C: Hgb A1c MFr Bld: 5.6 % (ref 4.6–6.5)

## 2021-05-04 NOTE — Patient Instructions (Addendum)
I will check prediabetes (blood sugar test). Recheck in 6 months.  Keep follow up with specialists as planned. Please schedule dental and eye doctor visits.  Let me know if I can help with discussions of advanced directives (living will, healthcare power of attorney).  Thanks for coming in today.   Preventive Care 52 Years and Older, Male Preventive care refers to lifestyle choices and visits with your health care provider that can promote health and wellness. Preventive care visits are also called wellness exams. What can I expect for my preventive care visit? Counseling During your preventive care visit, your health care provider may ask about your: Medical history, including: Past medical problems. Family medical history. History of falls. Current health, including: Emotional well-being. Home life and relationship well-being. Sexual activity. Memory and ability to understand (cognition). Lifestyle, including: Alcohol, nicotine or tobacco, and drug use. Access to firearms. Diet, exercise, and sleep habits. Work and work Astronomer. Sunscreen use. Safety issues such as seatbelt and bike helmet use. Physical exam Your health care provider will check your: Height and weight. These may be used to calculate your BMI (body mass index). BMI is a measurement that tells if you are at a healthy weight. Waist circumference. This measures the distance around your waistline. This measurement also tells if you are at a healthy weight and may help predict your risk of certain diseases, such as type 2 diabetes and high blood pressure. Heart rate and blood pressure. Body temperature. Skin for abnormal spots. What immunizations do I need? Vaccines are usually given at various ages, according to a schedule. Your health care provider will recommend vaccines for you based on your age, medical history, and lifestyle or other factors, such as travel or where you work. What tests do I  need? Screening Your health care provider may recommend screening tests for certain conditions. This may include: Lipid and cholesterol levels. Diabetes screening. This is done by checking your blood sugar (glucose) after you have not eaten for a while (fasting). Hepatitis C test. Hepatitis B test. HIV (human immunodeficiency virus) test. STI (sexually transmitted infection) testing, if you are at risk. Lung cancer screening. Colorectal cancer screening. Prostate cancer screening. Abdominal aortic aneurysm (AAA) screening. You may need this if you are a current or former smoker. Talk with your health care provider about your test results, treatment options, and if necessary, the need for more tests. Follow these instructions at home: Eating and drinking  Eat a diet that includes fresh fruits and vegetables, whole grains, lean protein, and low-fat dairy products. Limit your intake of foods with high amounts of sugar, saturated fats, and salt. Take vitamin and mineral supplements as recommended by your health care provider. Do not drink alcohol if your health care provider tells you not to drink. If you drink alcohol: Limit how much you have to 0-2 drinks a day. Know how much alcohol is in your drink. In the [place] one drink equals one 12 oz bottle of beer (355 mL), one 5 oz glass of wine (148 mL), or one 1 oz glass of hard liquor (44 mL). Lifestyle Brush your teeth every morning and night with fluoride toothpaste. Floss one time each day. Exercise for at least 30 minutes 5 or more days each week. Do not use any products that contain nicotine or tobacco. These products include cigarettes, chewing tobacco, and vaping devices, such as e-cigarettes. If you need help quitting, ask your health care provider. Do not use drugs. If you are sexually  active, practice safe sex. Use a condom or other form of protection to prevent STIs. Take aspirin only as told by your health care provider. Make sure  that you understand how much to take and what form to take. Work with your health care provider to find out whether it is safe and beneficial for you to take aspirin daily. Ask your health care provider if you need to take a cholesterol-lowering medicine (statin). Find healthy ways to manage stress, such as: Meditation, yoga, or listening to music. Journaling. Talking to a trusted person. Spending time with friends and family. Safety Always wear your seat belt while driving or riding in a vehicle. Do not drive: If you have been drinking alcohol. Do not ride with someone who has been drinking. When you are tired or distracted. While texting. If you have been using any mind-altering substances or drugs. Wear a helmet and other protective equipment during sports activities. If you have firearms in your house, make sure you follow all gun safety procedures. Minimize exposure to UV radiation to reduce your risk of skin cancer. What's next? Visit your health care provider once a year for an annual wellness visit. Ask your health care provider how often you should have your eyes and teeth checked. Stay up to date on all vaccines. This information is not intended to replace advice given to you by your health care provider. Make sure you discuss any questions you have with your health care provider. Document Revised: 08/25/2020 Document Reviewed: 08/25/2020 Elsevier Patient Education  2022 [employer].

## 2021-05-04 NOTE — Progress Notes (Signed)
Subjective:  Patient ID: Cory DressKenneth B Johnson, male    DOB: 21-Dec-1955  Age: 66 y.o. MRN: [id number]  CC:  Chief Complaint  Patient presents with   Annual Exam    Pt here for annual exam, pt wife states she has concern about A1cas pt has acquired a taste for sweets would like this checked with labs     HPI Cory Johnson presents for   Presents for annual wellness exam.  and concern as above.  Last visit with me in 2021.  Prediabetes increased sugar containing foods recently.  Last A1c in 2021. Few cookies or cake at time.  No home blood sugars. No recent change in vision, thirst, urination.  Wt Readings from Last 3 Encounters:  05/04/21 168 lb 3.2 oz (76.3 kg)  11/02/20 161 lb 3.2 oz (73.1 kg)  04/22/20 167 lb (75.8 kg)    Lab Results  Component Value Date   HGBA1C (H) 11/08/2009    6.1 (NOTE)                                                                       According to the ADA Clinical Practice Recommendations for 2011, when HbA1c is used as a screening test:   >=6.5%   Diagnostic of Diabetes Mellitus           (if abnormal result  is confirmed)  5.7-6.4%   Increased risk of developing Diabetes Mellitus  References:Diagnosis and Classification of Diabetes Mellitus,Diabetes Care,2011,34(Suppl 1):S62-S69 and Standards of Medical Care in         Diabetes - 2011,Diabetes Care,2011,34  (Suppl 1):S11-S61.   Lab Results  Component Value Date   CHOL 176 01/29/2019   HDL 76 01/29/2019   LDLCALC 88 01/29/2019   TRIG 64 01/29/2019   CHOLHDL 2.3 01/29/2019      Care team: PCP: me Physical medicine and rehab, Dr. Ethelene Halamos (DDD of cervical and lumbar disks.  Chronic pain syndrome, treated with hydrocodone. Neurology, Dr. [name] locally, [facility] (Lewy body dementia, Parkinsonism).  Appointment December 8, discussed options for tremors, adaptive tools.  Discussed PT, declined.  Continued use of walker.  Continued Sinemet Podiatry, Dr.  [facility], Dr. Benancio DeedsNewsome. BPH with LUTS, appointment January 30.  Continued Macrobid daily for suppression antibiotic therapy, finasteride 5 mg daily, with tamsulosin 0.4 mg daily.  1754-month follow-up for PVR and possible weaning of tamsulosin.   Fall screening Fall Risk  05/04/2021 01/23/2020 01/09/2020 05/15/2019 04/30/2019  Falls in the past year? 0 0 0 0 0  Comment - - - - -  Number falls in past yr: 0 - 0 - 0  Injury with Fall? 0 - 0 - 0  Comment - - - - -  Risk for fall due to : No Fall Risks - - - -  Follow up Falls evaluation completed Falls evaluation completed Falls evaluation completed Falls evaluation completed Falls evaluation completed   Lighting in home: adequate Loose rugs/carpets/pets: none Stairs: 1 flight with handrail - no difficulty.  Grab bars in bathroom: in shower. Timed up and go:14 seconds, slow to rise, steadies self, minimal upper body movement, no instability. Has walker at home   Depression Screening: Depression screen PHQ  2/9 05/04/2021 01/23/2020 01/09/2020 05/15/2019 01/29/2019  Decreased Interest 0 0 0 0 0  Down, Depressed, Hopeless 0 0 0 0 0  PHQ - 2 Score 0 0 0 0 0  Altered sleeping 0 - - - -  Tired, decreased energy 1 - - - -  Change in appetite 2 - - - -  Feeling bad or failure about yourself  0 - - - -  Trouble concentrating 2 - - - -  Moving slowly or fidgety/restless 2 - - - -  Suicidal thoughts 0 - - - -  PHQ-9 Score 7 - - - -  Symptoms above with parkinsonism. Denies depression.   Cancer Screening: Colonoscopy 06/08/2016, normal, repeat 10 years.  Followed by urology for enlarged prostate with regular PSAs.   Immunization History  Administered Date(s) Administered   PFIZER(Purple Top)SARS-COV-2 Vaccination 06/06/2019, 07/02/2019   Tdap 05/24/2017, 06/21/2018  COVID-19 vaccine booster: November with flu shot.  Shingles vaccine: declines at this time.  Pneumonia vaccine: in November at [facility].   Functional Status Survey: Is the  patient deaf or have difficulty hearing?: No Does the patient have difficulty seeing, even when wearing glasses/contacts?: Yes Does the patient have difficulty concentrating, remembering, or making decisions?: No Does the patient have difficulty walking or climbing stairs?: Yes (is more careful) Does the patient have difficulty dressing or bathing?: No Does the patient have difficulty doing errands alone such as visiting a doctor's office or shopping?: Yes Parkinsonism with above sx's.  Recommended updated eye exam - not see recently.   Memory Screen: 6CIT Screen 05/04/2021 01/29/2019  What Year? 0 points 0 points  What month? 0 points 0 points  What time? 0 points (No Data)  Count back from 20 0 points 0 points  Months in reverse 4 points 0 points  Repeat phrase 2 points 0 points  Total Score 6 -  Followed by neurology.   Alcohol Screening: Flowsheet Row Office Visit from 05/04/2021 in [facility]  AUDIT-C Score 0       Tobacco: None.   Vision Screening   Right eye Left eye Both eyes  Without correction 20/40-1 20/40 20/30-1  With correction      Optho/optometry: Recommended follow up as above.   Dental: Overdue - few years ago - recommended appt.   Exercise: [employer], 2-3 times per week, 1 hour each time.  Advanced Directives:  Discussed living will and HCPOA. Has paperwork, not completed.   History Patient Active Problem List   Diagnosis Date Noted   Vasomotor rhinitis 06/28/2020   Sensorineural hearing loss (SNHL), bilateral 06/28/2020   Movement disorder 10/21/2019   Dementia with behavioral disturbance 10/21/2019   Lewy body dementia (HCC) 01/01/2019   Sepsis (HCC) 09/10/2018   Acute metabolic encephalopathy 09/10/2018   Thrombocytopenia (HCC) 09/10/2018   Total bilirubin, elevated 09/10/2018   Cerebellar ataxia in diseases classified elsewhere (HCC) 09/17/2017   REM sleep behavior disorder 09/17/2017    Chronic pain syndrome 05/21/2017   Tremor 05/08/2017   Gait abnormality 05/08/2017   Transient hypotension 05/05/2017   Contracture of joint of finger 04/16/2017   Long-term current use of opiate analgesic 04/02/2017   Malnutrition of moderate degree 03/28/2017   Syncope and collapse 03/26/2017   Closed fracture of shaft of left humerus 03/19/2017   Subungual hematoma of great toe of right foot 12/13/2016   Pain of toe of right foot 12/13/2016   Paresthesia 06/29/2016   Tremor of right hand 06/29/2016  Mild cognitive impairment 06/29/2016   Tremors of nervous system 06/22/2016   Drooling 06/22/2016   Loss of weight 06/22/2016   Family history of throat cancer 06/22/2016   Abnormality of gait 06/09/2016   Memory loss 12/31/2013   Arthritis    Myoclonic jerking    Right inguinal hernia 03/17/2011   Umbilical hernia 03/17/2011   Acute pulmonary embolus (HCC) 11/07/2009   DVT of lower extremity (deep venous thrombosis) (HCC) 11/07/2009   Past Medical History:  Diagnosis Date   Acute pulmonary embolus (HCC) 11/07/2009   pt denies   Arthritis    neck - limited range of motion; back, shoulders   Chronic back pain    Difficult intubation    DJD in back and neck, pt unable to lay flat.   DJD (degenerative joint disease)    DVT of lower extremity (deep venous thrombosis) (HCC) 11/07/2009   Occasional tremors 03/2017   HANDS    Parkinson's disease (HCC)    Right inguinal hernia 02/2011   Seizures (HCC)    pt denies   Syncope and collapse 03/26/2017   Umbilical hernia 02/2011   Past Surgical History:  Procedure Laterality Date   CLOSED REDUCTION FINGER WITH PERCUTANEOUS PINNING Left 06/26/2018   Procedure: CLOSED REDUCTION FINGER WITH PERCUTANEOUS PINNING;  Surgeon: Ernest Mallick, MD;  Location: [facility];  Service: Orthopedics;  Laterality: Left;   CYSTOSCOPY WITH URETHRAL DILATATION N/A 11/02/2020   Procedure: CYSTOSCOPY WITH BULBAR URETHRAL  DILATATION;  Surgeon: [name], MD;  Location[facility];  Service: Urology;  Laterality: N/A;   HERNIA REPAIR     I & D EXTREMITY Left 06/26/2018   Procedure: Left Index finger wound exploration, irrigation and debridement, fracture fixation, flexor tendon repair, digital nerve repair with conduit as needed and surgery as indicated;  Surgeon: Ernest Mallick, MD;  Location: [facility];  Service: Orthopedics;  Laterality: Left;    INGUINAL HERNIA REPAIR  03/17/2011   Procedure: HERNIA REPAIR INGUINAL ADULT;  Surgeon: Ernestene Mention, MD;  Location: [facility];  Service: General;  Laterality: Right;  right inguinal hernia repair with mesh   KNEE SURGERY  1977   left   NERVE AND TENDON REPAIR Left 06/26/2018   Procedure: NERVE AND TENDON REPAIR;  Surgeon: Ernest Mallick, MD;  Location: [facility];  Service: Orthopedics;  Laterality: Left;   UMBILICAL HERNIA REPAIR  03/17/2011   Procedure: HERNIA REPAIR UMBILICAL ADULT;  Surgeon: Ernestene Mention, MD;  Location: [facility];  Service: General;  Laterality: N/A;  umbilical hernia repair with mesh   No Known Allergies Prior to Admission medications   Medication Sig Start Date End Date Taking? Authorizing Provider  Carbidopa-Levodopa ER (SINEMET CR) 25-100 MG tablet controlled release Take 1 tablet by mouth in the morning, at noon, and at bedtime. 10/21/19   Levert Feinstein, MD  finasteride (PROSCAR) 5 MG tablet Take 5 mg by mouth daily.    [provider]  HYDROcodone-acetaminophen (NORCO) 10-325 MG tablet Take 1 tablet by mouth 5 (five) times daily as needed for moderate pain. 08/02/18   [provider]  tamsulosin (FLOMAX) 0.4 MG CAPS capsule Take 0.4 mg by mouth.    [provider]  vitamin B-12 (CYANOCOBALAMIN) 500 MCG tablet Take 1 tablet (500 mcg total) by mouth daily. 09/13/18   Erick Blinks, MD   Social History    Socioeconomic History  Marital status: Married    Spouse name: [name]   Number of children: 5   Years of education: college   Highest education level: Not on file  Occupational History   Occupation: DIRECTOR    Employer: [employer]  Tobacco Use   Smoking status: Never   Smokeless tobacco: Never  Vaping Use   Vaping Use: Never used  Substance and Sexual Activity   Alcohol use: No   Drug use: No   Sexual activity: Yes    Birth control/protection: Surgical  Other Topics Concern   Not on file  Social History Narrative   Patient lives at home with his wife [name].   Disabled.   Education college.   Right handed.   Caffeine one cup of coffee daily.   Social Determinants of Health   Financial Resource Strain: Not on file  Food Insecurity: Not on file  Transportation Needs: Not on file  Physical Activity: Not on file  Stress: Not on file  Social Connections: Not on file  Intimate Partner Violence: Not on file    Review of Systems  13 point review of systems per patient health survey noted.  Negative other than as indicated above or in HPI.   Objective:   Vitals:   05/04/21 0805  BP: 108/60  Pulse: 74  Resp: 16  Temp: 97.9 F (36.6 C)  TempSrc: Temporal  Weight: 168 lb 3.2 oz (76.3 kg)  Height: 6\' 2"  (1.88 m)     Physical Exam Vitals reviewed.  Constitutional:      Appearance: He is well-developed.  HENT:     Head: Normocephalic and atraumatic.     Right Ear: External ear normal.     Left Ear: External ear normal.  Eyes:     Conjunctiva/sclera: Conjunctivae normal.     Pupils: Pupils are equal, round, and reactive to light.  Neck:     Thyroid: No thyromegaly.  Cardiovascular:     Rate and Rhythm: Normal rate and regular rhythm.     Heart sounds: Normal heart sounds.  Pulmonary:     Effort: Pulmonary effort is normal. No respiratory distress.     Breath sounds: Normal breath sounds. No wheezing.  Abdominal:     General: There  is no distension.     Palpations: Abdomen is soft.     Tenderness: There is no abdominal tenderness.  Musculoskeletal:        General: No tenderness. Normal range of motion.     Cervical back: Normal range of motion and neck supple.  Lymphadenopathy:     Cervical: No cervical adenopathy.  Skin:    General: Skin is warm and dry.  Neurological:     Mental Status: He is alert and oriented to person, place, and time.     Deep Tendon Reflexes: Reflexes are normal and symmetric.     Comments: Resting tremor of both arms, hands.   Psychiatric:        Mood and Affect: Mood normal.        Behavior: Behavior normal.       Assessment & Plan:  JADARION HALBIG is a 66 y.o. male . Medicare annual wellness visit, subsequent  - - anticipatory guidance as below in AVS, screening labs if needed. Health maintenance items as above in HPI discussed/recommended as applicable.  - no concerning responses on depression, fall, or functional status screening. Any positive responses noted as above. Advanced directives discussed as in CHL.   Prediabetes  -  check A1c, CMP. May need to adjust diet  Lewy body dementia with behavioral disturbance (HCC)  - on Sinemet, continue follow up with neuro - recommendations with PT, botox noted prior - declined  Benign prostatic hyperplasia with lower urinary tract symptoms, symptom details unspecified History of elevated PSA  - on meds above, persistent frequency - follow up with urology, check A1c.    No orders of the defined types were placed in this encounter.  Patient Instructions  I will check prediabetes (blood sugar test). Recheck in 6 months.  Keep follow up with specialists as planned.  Thanks for coming in today.   Preventive Care 18 Years and Older, Male Preventive care refers to lifestyle choices and visits with your health care provider that can promote health and wellness. Preventive care visits are also called wellness exams. What can I  expect for my preventive care visit? Counseling During your preventive care visit, your health care provider may ask about your: Medical history, including: Past medical problems. Family medical history. History of falls. Current health, including: Emotional well-being. Home life and relationship well-being. Sexual activity. Memory and ability to understand (cognition). Lifestyle, including: Alcohol, nicotine or tobacco, and drug use. Access to firearms. Diet, exercise, and sleep habits. Work and work Astronomer. Sunscreen use. Safety issues such as seatbelt and bike helmet use. Physical exam Your health care provider will check your: Height and weight. These may be used to calculate your BMI (body mass index). BMI is a measurement that tells if you are at a healthy weight. Waist circumference. This measures the distance around your waistline. This measurement also tells if you are at a healthy weight and may help predict your risk of certain diseases, such as type 2 diabetes and high blood pressure. Heart rate and blood pressure. Body temperature. Skin for abnormal spots. What immunizations do I need? Vaccines are usually given at various ages, according to a schedule. Your health care provider will recommend vaccines for you based on your age, medical history, and lifestyle or other factors, such as travel or where you work. What tests do I need? Screening Your health care provider may recommend screening tests for certain conditions. This may include: Lipid and cholesterol levels. Diabetes screening. This is done by checking your blood sugar (glucose) after you have not eaten for a while (fasting). Hepatitis C test. Hepatitis B test. HIV (human immunodeficiency virus) test. STI (sexually transmitted infection) testing, if you are at risk. Lung cancer screening. Colorectal cancer screening. Prostate cancer screening. Abdominal aortic aneurysm (AAA) screening. You may need  this if you are a current or former smoker. Talk with your health care provider about your test results, treatment options, and if necessary, the need for more tests. Follow these instructions at home: Eating and drinking  Eat a diet that includes fresh fruits and vegetables, whole grains, lean protein, and low-fat dairy products. Limit your intake of foods with high amounts of sugar, saturated fats, and salt. Take vitamin and mineral supplements as recommended by your health care provider. Do not drink alcohol if your health care provider tells you not to drink. If you drink alcohol: Limit how much you have to 0-2 drinks a day. Know how much alcohol is in your drink. In the [place] one drink equals one 12 oz bottle of beer (355 mL), one 5 oz glass of wine (148 mL), or one 1 oz glass of hard liquor (44 mL). Lifestyle Brush your teeth every morning and night with fluoride toothpaste.  Floss one time each day. Exercise for at least 30 minutes 5 or more days each week. Do not use any products that contain nicotine or tobacco. These products include cigarettes, chewing tobacco, and vaping devices, such as e-cigarettes. If you need help quitting, ask your health care provider. Do not use drugs. If you are sexually active, practice safe sex. Use a condom or other form of protection to prevent STIs. Take aspirin only as told by your health care provider. Make sure that you understand how much to take and what form to take. Work with your health care provider to find out whether it is safe and beneficial for you to take aspirin daily. Ask your health care provider if you need to take a cholesterol-lowering medicine (statin). Find healthy ways to manage stress, such as: Meditation, yoga, or listening to music. Journaling. Talking to a trusted person. Spending time with friends and family. Safety Always wear your seat belt while driving or riding in a vehicle. Do not drive: If you have been drinking  alcohol. Do not ride with someone who has been drinking. When you are tired or distracted. While texting. If you have been using any mind-altering substances or drugs. Wear a helmet and other protective equipment during sports activities. If you have firearms in your house, make sure you follow all gun safety procedures. Minimize exposure to UV radiation to reduce your risk of skin cancer. What's next? Visit your health care provider once a year for an annual wellness visit. Ask your health care provider how often you should have your eyes and teeth checked. Stay up to date on all vaccines. This information is not intended to replace advice given to you by your health care provider. Make sure you discuss any questions you have with your health care provider. Document Revised: 08/25/2020 Document Reviewed: 08/25/2020 Elsevier Patient Education  2022 Elsevier Inc.      Signed,   Meredith StaggersJeffrey Zaccary Creech, MD [facility], [facility] 05/04/21 8:22 AM

## 2021-05-16 ENCOUNTER — Ambulatory Visit: Payer: Medicare Other | Admitting: Podiatry

## 2021-05-16 ENCOUNTER — Encounter: Payer: Self-pay | Admitting: Podiatry

## 2021-05-16 ENCOUNTER — Other Ambulatory Visit: Payer: Self-pay

## 2021-05-16 DIAGNOSIS — B351 Tinea unguium: Secondary | ICD-10-CM | POA: Diagnosis not present

## 2021-05-16 DIAGNOSIS — M79674 Pain in right toe(s): Secondary | ICD-10-CM | POA: Diagnosis not present

## 2021-05-16 DIAGNOSIS — L603 Nail dystrophy: Secondary | ICD-10-CM

## 2021-05-16 DIAGNOSIS — D696 Thrombocytopenia, unspecified: Secondary | ICD-10-CM

## 2021-05-16 NOTE — Progress Notes (Signed)
This patient returns to my office for at risk foot care.  This patient requires this care by a professional since this patient will be at risk due to having thrombocytopenia.  This patient is unable to cut nails himself since the patient cannot reach his nails.These nails are painful walking and wearing shoes. He presents to the office with his wife. This patient presents for at risk foot care today.  General Appearance  Alert, conversant and in no acute stress.  Vascular  Dorsalis pedis and posterior tibial  pulses are palpable  bilaterally.  Capillary return is within normal limits  bilaterally. Temperature is within normal limits  bilaterally.  Neurologic  Senn-Weinstein monofilament wire test absent   bilaterally. Muscle power within normal limits bilaterally.  Nails Thick disfigured discolored nails with subungual debris  from hallux to fifth toes bilaterally. No evidence of bacterial infection or drainage bilaterally.  Orthopedic  No limitations of motion  feet .  No crepitus or effusions noted.  No bony pathology .  HAV  B/L.  Hammer toes  2-5  B/L.  Midfoot  DJD  B/L.  Skin  normotropic skin with no porokeratosis noted bilaterally.  No signs of infections or ulcers noted.     Onychomycosis  Pain in right toes  Pain in left toes  Consent was obtained for treatment procedures.   Mechanical debridement of nails 1-5  bilaterally performed with a nail nipper.  Filed with dremel without incident.    Return office visit     4  months                 Told patient to return for periodic foot care and evaluation due to potential at risk complications.   Dandre Sisler DPM  

## 2021-08-01 DIAGNOSIS — R8279 Other abnormal findings on microbiological examination of urine: Secondary | ICD-10-CM | POA: Diagnosis not present

## 2021-08-01 DIAGNOSIS — G20 Parkinson's disease: Secondary | ICD-10-CM | POA: Diagnosis not present

## 2021-08-01 DIAGNOSIS — R3914 Feeling of incomplete bladder emptying: Secondary | ICD-10-CM | POA: Diagnosis not present

## 2021-08-01 DIAGNOSIS — N130 Hydronephrosis with ureteropelvic junction obstruction: Secondary | ICD-10-CM | POA: Diagnosis not present

## 2021-08-03 DIAGNOSIS — H40013 Open angle with borderline findings, low risk, bilateral: Secondary | ICD-10-CM | POA: Diagnosis not present

## 2021-08-05 ENCOUNTER — Telehealth: Payer: Self-pay

## 2021-08-05 NOTE — Telephone Encounter (Deleted)
Given to Dr. Neva Seat to review. Once reviewed the

## 2021-08-05 NOTE — Telephone Encounter (Signed)
Given to Dr. Carlota Raspberry to review. Once reviewed the Office visit report will be scanned to the chart.

## 2021-09-16 ENCOUNTER — Ambulatory Visit (INDEPENDENT_AMBULATORY_CARE_PROVIDER_SITE_OTHER): Payer: Medicare Other | Admitting: Podiatry

## 2021-09-16 ENCOUNTER — Encounter: Payer: Self-pay | Admitting: Podiatry

## 2021-09-16 DIAGNOSIS — L603 Nail dystrophy: Secondary | ICD-10-CM | POA: Diagnosis not present

## 2021-09-16 DIAGNOSIS — M79674 Pain in right toe(s): Secondary | ICD-10-CM

## 2021-09-16 DIAGNOSIS — B351 Tinea unguium: Secondary | ICD-10-CM | POA: Diagnosis not present

## 2021-09-16 DIAGNOSIS — D696 Thrombocytopenia, unspecified: Secondary | ICD-10-CM

## 2021-09-16 NOTE — Progress Notes (Signed)
This patient returns to my office for at risk foot care.  This patient requires this care by a professional since this patient will be at risk due to having thrombocytopenia.  This patient is unable to cut nails himself since the patient cannot reach his nails.These nails are painful walking and wearing shoes. He presents to the office with his wife. This patient presents for at risk foot care today.  General Appearance  Alert, conversant and in no acute stress.  Vascular  Dorsalis pedis and posterior tibial  pulses are palpable  bilaterally.  Capillary return is within normal limits  bilaterally. Temperature is within normal limits  bilaterally.  Neurologic  Senn-Weinstein monofilament wire test absent   bilaterally. Muscle power within normal limits bilaterally.  Nails Thick disfigured discolored nails with subungual debris  from hallux to fifth toes bilaterally. No evidence of bacterial infection or drainage bilaterally.  Orthopedic  No limitations of motion  feet .  No crepitus or effusions noted.  No bony pathology .  HAV  B/L.  Hammer toes  2-5  B/L.  Midfoot  DJD  B/L.  Skin  normotropic skin with no porokeratosis noted bilaterally.  No signs of infections or ulcers noted.     Onychomycosis  Pain in right toes  Pain in left toes  Consent was obtained for treatment procedures.   Mechanical debridement of nails 1-5  bilaterally performed with a nail nipper.  Filed with dremel without incident.    Return office visit     3   months                 Told patient to return for periodic foot care and evaluation due to potential at risk complications.   Ali Mohl DPM  

## 2021-10-12 DIAGNOSIS — M531 Cervicobrachial syndrome: Secondary | ICD-10-CM | POA: Diagnosis not present

## 2021-10-12 DIAGNOSIS — K117 Disturbances of salivary secretion: Secondary | ICD-10-CM | POA: Diagnosis not present

## 2021-10-12 DIAGNOSIS — R29898 Other symptoms and signs involving the musculoskeletal system: Secondary | ICD-10-CM | POA: Diagnosis not present

## 2021-10-12 DIAGNOSIS — G20 Parkinson's disease: Secondary | ICD-10-CM | POA: Diagnosis not present

## 2021-10-12 DIAGNOSIS — G250 Essential tremor: Secondary | ICD-10-CM | POA: Diagnosis not present

## 2021-10-12 DIAGNOSIS — F02818 Dementia in other diseases classified elsewhere, unspecified severity, with other behavioral disturbance: Secondary | ICD-10-CM | POA: Diagnosis not present

## 2021-10-17 DIAGNOSIS — R633 Feeding difficulties, unspecified: Secondary | ICD-10-CM | POA: Diagnosis not present

## 2021-10-17 DIAGNOSIS — R1310 Dysphagia, unspecified: Secondary | ICD-10-CM | POA: Diagnosis not present

## 2021-10-17 DIAGNOSIS — R1312 Dysphagia, oropharyngeal phase: Secondary | ICD-10-CM | POA: Diagnosis not present

## 2021-10-17 DIAGNOSIS — G20 Parkinson's disease: Secondary | ICD-10-CM | POA: Diagnosis not present

## 2021-10-24 ENCOUNTER — Ambulatory Visit (HOSPITAL_COMMUNITY)
Admission: EM | Admit: 2021-10-24 | Discharge: 2021-10-24 | Disposition: A | Discharge status: Home / Self Care | Payer: Medicare Other | Attending: Internal Medicine | Admitting: Internal Medicine

## 2021-10-24 ENCOUNTER — Encounter (HOSPITAL_COMMUNITY): Payer: Self-pay | Admitting: Emergency Medicine

## 2021-10-24 DIAGNOSIS — R197 Diarrhea, unspecified: Secondary | ICD-10-CM

## 2021-10-24 MED ORDER — ONDANSETRON 4 MG PO TBDP: 4.0000 mg | ORAL_TABLET | Freq: Three times a day (TID) | ORAL | 0 refills | Status: DC | PRN

## 2021-10-24 NOTE — ED Triage Notes (Signed)
Pt reports waking up this morning with cold and numb feet, diarrhea and elevated blood pressure. States BP is normally low but this morning BP was ranging from 130's-140's systolic and 90's diastolic.

## 2021-10-24 NOTE — Discharge Instructions (Addendum)
Increase oral fluid intake Take medications as prescribed Please keep your appointment with your primary care physician Return to urgent care if you have any other concerns.

## 2021-10-25 DIAGNOSIS — M5136 Other intervertebral disc degeneration, lumbar region: Secondary | ICD-10-CM | POA: Diagnosis not present

## 2021-10-25 DIAGNOSIS — M5030 Other cervical disc degeneration, unspecified cervical region: Secondary | ICD-10-CM | POA: Diagnosis not present

## 2021-10-25 DIAGNOSIS — Z79899 Other long term (current) drug therapy: Secondary | ICD-10-CM | POA: Diagnosis not present

## 2021-10-25 DIAGNOSIS — Z79891 Long term (current) use of opiate analgesic: Secondary | ICD-10-CM | POA: Diagnosis not present

## 2021-10-25 DIAGNOSIS — Z5181 Encounter for therapeutic drug level monitoring: Secondary | ICD-10-CM | POA: Diagnosis not present

## 2021-10-25 DIAGNOSIS — G894 Chronic pain syndrome: Secondary | ICD-10-CM | POA: Diagnosis not present

## 2021-10-25 NOTE — ED Provider Notes (Signed)
[facility]    CSN: [id number] Arrival date & time: 10/24/21  1151      History   Chief Complaint Chief Complaint  Patient presents with   Diarrhea   Hypertension    HPI Cory Johnson is a 66 y.o. male with a history of Parkinson's disease comes to urgent care with 1 day history of diarrhea with nausea.  Patient's symptoms started fairly abruptly this morning and has been persistent.  He has had a few diarrheal bowel movements.  No bloody stool.  Stool is not pale.  Patient denies any change in dietary habits.  No runny nose, sneezing cough or sore throat.  No recent travel.  No sick contacts at home.  Patient denies abdominal pain, bloating, nausea or vomiting.  He denies any fever or chills.  Patient also describes a sensation of cold feet.  He denies any similar symptoms in the fingers.  No discoloration of the feet.   HPI  Past Medical History:  Diagnosis Date   Acute pulmonary embolus (HCC) 11/07/2009   pt denies   Arthritis    neck - limited range of motion; back, shoulders   Chronic back pain    Difficult intubation    DJD in back and neck, pt unable to lay flat.   DJD (degenerative joint disease)    DVT of lower extremity (deep venous thrombosis) (HCC) 11/07/2009   Occasional tremors 03/2017   HANDS    Parkinson's disease (HCC)    Right inguinal hernia 02/2011   Seizures (HCC)    pt denies   Syncope and collapse 03/26/2017   Umbilical hernia 02/2011    Patient Active Problem List   Diagnosis Date Noted   Vasomotor rhinitis 06/28/2020   Sensorineural hearing loss (SNHL), bilateral 06/28/2020   Movement disorder 10/21/2019   Dementia with behavioral disturbance (HCC) 10/21/2019   Lewy body dementia (HCC) 01/01/2019   Sepsis (HCC) 09/10/2018   Acute metabolic encephalopathy 09/10/2018   Thrombocytopenia (HCC) 09/10/2018   Total bilirubin, elevated 09/10/2018   Cerebellar ataxia in diseases classified elsewhere (HCC) 09/17/2017   REM sleep  behavior disorder 09/17/2017   Chronic pain syndrome 05/21/2017   Tremor 05/08/2017   Gait abnormality 05/08/2017   Transient hypotension 05/05/2017   Contracture of joint of finger 04/16/2017   Long-term current use of opiate analgesic 04/02/2017   Malnutrition of moderate degree 03/28/2017   Syncope and collapse 03/26/2017   Closed fracture of shaft of left humerus 03/19/2017   Subungual hematoma of great toe of right foot 12/13/2016   Pain of toe of right foot 12/13/2016   Paresthesia 06/29/2016   Tremor of right hand 06/29/2016   Mild cognitive impairment 06/29/2016   Tremors of nervous system 06/22/2016   Drooling 06/22/2016   Loss of weight 06/22/2016   Family history of throat cancer 06/22/2016   Abnormality of gait 06/09/2016   Memory loss 12/31/2013   Arthritis    Myoclonic jerking    Right inguinal hernia 03/17/2011   Umbilical hernia 03/17/2011   Acute pulmonary embolus (HCC) 11/07/2009   DVT of lower extremity (deep venous thrombosis) (HCC) 11/07/2009    Past Surgical History:  Procedure Laterality Date   CLOSED REDUCTION FINGER WITH PERCUTANEOUS PINNING Left 06/26/2018   Procedure: CLOSED REDUCTION FINGER WITH PERCUTANEOUS PINNING;  Surgeon: Ernest Mallick, MD;  [facility];  Service: Orthopedics;  Laterality: Left;   CYSTOSCOPY WITH URETHRAL DILATATION N/A 11/02/2020   Procedure: CYSTOSCOPY WITH BULBAR URETHRAL  DILATATION;  Surgeon: Remi Haggard, MD;  Location: [facility];  Service: Urology;  Laterality: N/A;   HERNIA REPAIR     I & D EXTREMITY Left 06/26/2018   Procedure: Left Index finger wound exploration, irrigation and debridement, fracture fixation, flexor tendon repair, digital nerve repair with conduit as needed and surgery as indicated;  Surgeon: Verner Mould, MD;  Location: [facility];  Service: Orthopedics;  Laterality: Left;  70min   INGUINAL HERNIA REPAIR  03/17/2011   Procedure:  HERNIA REPAIR INGUINAL ADULT;  Surgeon: Adin Hector, MD;  Location: [facility];  Service: General;  Laterality: Right;  right inguinal hernia repair with mesh   KNEE SURGERY  1977   left   NERVE AND TENDON REPAIR Left 06/26/2018   Procedure: NERVE AND TENDON REPAIR;  Surgeon: Verner Mould, MD;  Location: [facility];  Service: Orthopedics;  Laterality: Left;   UMBILICAL HERNIA REPAIR  03/17/2011   Procedure: HERNIA REPAIR UMBILICAL ADULT;  Surgeon: Adin Hector, MD;  Location: [facility];  Service: General;  Laterality: N/A;  umbilical hernia repair with mesh       Home Medications    Prior to Admission medications   Medication Sig Start Date End Date Taking? Authorizing Provider  ondansetron (ZOFRAN-ODT) 4 MG disintegrating tablet Take 1 tablet (4 mg total) by mouth every 8 (eight) hours as needed for nausea or vomiting. 10/24/21  Yes Eoin Willden, Myrene Galas, MD  Carbidopa-Levodopa ER (SINEMET CR) 25-100 MG tablet controlled release Take 1 tablet by mouth in the morning, at noon, and at bedtime. 10/21/19   Marcial Pacas, MD  finasteride (PROSCAR) 5 MG tablet Take 5 mg by mouth daily.    [provider]  HYDROcodone-acetaminophen (NORCO) 10-325 MG tablet Take 1 tablet by mouth 5 (five) times daily as needed for moderate pain. 08/02/18   [provider]  tamsulosin (FLOMAX) 0.4 MG CAPS capsule Take 0.4 mg by mouth.    [provider]  vitamin B-12 (CYANOCOBALAMIN) 500 MCG tablet Take 1 tablet (500 mcg total) by mouth daily. 09/13/18   Kathie Dike, MD    Family History Family History  Problem Relation Age of Onset   Hypertension Other     Social History Social History   Tobacco Use   Smoking status: Never   Smokeless tobacco: Never  Vaping Use   Vaping Use: Never used  Substance Use Topics   Alcohol use: No   Drug use: No     Allergies   Patient has no known allergies.   Review of  Systems Review of Systems  HENT: Negative.    Respiratory: Negative.    Cardiovascular: Negative.   Gastrointestinal:  Positive for diarrhea. Negative for abdominal distention, abdominal pain, anal bleeding, blood in stool, constipation, nausea and vomiting.  Musculoskeletal: Negative.   Neurological: Negative.      Physical Exam Triage Vital Signs ED Triage Vitals  Enc Vitals Group     BP 10/24/21 1350 117/77     Pulse Rate 10/24/21 1350 67     Resp 10/24/21 1350 20     Temp 10/24/21 1350 98 F (36.7 C)     Temp src --      SpO2 10/24/21 1350 95 %     Weight --      Height --      Head Circumference --      Peak Flow --  Pain Score 10/24/21 1352 0     Pain Loc --      Pain Edu? --      Excl. in GC? --    No data found.  Updated Vital Signs BP 117/77   Pulse 67   Temp 98 F (36.7 C)   Resp 20   SpO2 95%   Visual Acuity Right Eye Distance:   Left Eye Distance:   Bilateral Distance:    Right Eye Near:   Left Eye Near:    Bilateral Near:     Physical Exam Vitals and nursing note reviewed.  Constitutional:      General: He is not in acute distress.    Appearance: He is not ill-appearing.  HENT:     Right Ear: Tympanic membrane normal.     Left Ear: Tympanic membrane normal.     Mouth/Throat:     Pharynx: No posterior oropharyngeal erythema.  Cardiovascular:     Rate and Rhythm: Normal rate and regular rhythm.     Pulses: Normal pulses.     Heart sounds: Normal heart sounds.  Pulmonary:     Effort: Pulmonary effort is normal.     Breath sounds: Normal breath sounds.  Abdominal:     General: Bowel sounds are normal.     Palpations: Abdomen is soft.  Neurological:     Mental Status: He is alert.      UC Treatments / Results  Labs (all labs ordered are listed, but only abnormal results are displayed) Labs Reviewed - No data to display  EKG   Radiology No results found.  Procedures Procedures (including critical care  time)  Medications Ordered in UC Medications - No data to display  Initial Impression / Assessment and Plan / UC Course  I have reviewed the triage vital signs and the nursing notes.  Pertinent labs & imaging results that were available during my care of the patient were reviewed by me and considered in my medical decision making (see chart for details).     1.  Acute diarrhea: Zofran as needed for nausea/vomiting Increase oral fluid intake No need for stool studies at this point If symptoms persist patient is advised to return to urgent care to be reevaluated. Patient's abdominal exam is reassuring with no guarding or rebound tenderness.  Patient is hemodynamically stable. Final Clinical Impressions(s) / UC Diagnoses   Final diagnoses:  Acute diarrhea     Discharge Instructions      Increase oral fluid intake Take medications as prescribed Please keep your appointment with your primary care physician Return to urgent care if you have any other concerns.   ED Prescriptions     Medication Sig Dispense Auth. Provider   ondansetron (ZOFRAN-ODT) 4 MG disintegrating tablet Take 1 tablet (4 mg total) by mouth every 8 (eight) hours as needed for nausea or vomiting. 20 tablet Noor Vidales, Britta Mccreedy, MD      PDMP not reviewed this encounter.   Merrilee Jansky, MD 10/25/21 [id number]

## 2021-11-02 ENCOUNTER — Ambulatory Visit: Payer: Medicare Other | Admitting: Family Medicine

## 2021-11-02 VITALS — BP 112/66 | HR 65 | Temp 98.1°F | Resp 16 | Ht 74.0 in | Wt 166.8 lb

## 2021-11-02 DIAGNOSIS — R7989 Other specified abnormal findings of blood chemistry: Secondary | ICD-10-CM

## 2021-11-02 DIAGNOSIS — R7303 Prediabetes: Secondary | ICD-10-CM | POA: Diagnosis not present

## 2021-11-02 DIAGNOSIS — L608 Other nail disorders: Secondary | ICD-10-CM | POA: Diagnosis not present

## 2021-11-02 DIAGNOSIS — M79674 Pain in right toe(s): Secondary | ICD-10-CM

## 2021-11-02 DIAGNOSIS — G3183 Dementia with Lewy bodies: Secondary | ICD-10-CM

## 2021-11-02 DIAGNOSIS — R634 Abnormal weight loss: Secondary | ICD-10-CM

## 2021-11-02 DIAGNOSIS — F02818 Dementia in other diseases classified elsewhere, unspecified severity, with other behavioral disturbance: Secondary | ICD-10-CM | POA: Diagnosis not present

## 2021-11-02 LAB — POCT GLYCOSYLATED HEMOGLOBIN (HGB A1C): Hemoglobin A1C: 5.4 % (ref 4.0–5.6)

## 2021-11-02 NOTE — Patient Instructions (Addendum)
Toe pain could be related to the new nail that has grown in place of previous removed nail.  X-ray can be performed at our imaging facility below but I think that will be okay.  I recommend calling your podiatrist for an appointment to evaluate that nail and treatment options.  If any redness, discharge, bleeding or worsening symptoms be seen sooner.  Keep follow-up with specialists as planned.  Good seeing you today.  Let me know if I can be of any assistance.  [facility] in 8:30-4:30 during weekdays, no appointment needed [address].  [place], [place] [address]

## 2021-11-02 NOTE — Progress Notes (Signed)
Subjective:  Patient ID: Cory Johnson, male    DOB: 27-Oct-1955  Age: 66 y.o. MRN: [id number]  CC:  Chief Complaint  Patient presents with   Toe Pain    Notes Great toe of Rt foot is bleeding started 3 weeks ago,     HPI Cory Johnson presents for   Followed by neurology at [place] for Parkinson's, Lewy body dementia, sialorrhea, dropped head syndrome.  Levodopa responsive symptoms.  Visual hallucinations on low-dose Sinemet.Office visit August 2.  Overall nothing significantly worse.  Expected progression of dementia and parkinsonism.  Hospice/palliative care consultation discussed but not interested.  Option of Botox for drooling.  80-month follow-up planned.  He is followed by urology  - treated with finasteride, tamsulosin and nitrofurantoin as suppressive antibiotic, history of BPH.  History of prediabetes but normal A1c at 5.6 in February. Still eats sweets, desserts. Small portions at meals still. Has been discussed with parkinsons's specialist.   Wt Readings from Last 3 Encounters:  11/02/21 166 lb 12.8 oz (75.7 kg)  05/04/21 168 lb 3.2 oz (76.3 kg)  11/02/20 161 lb 3.2 oz (73.1 kg)     Right great toe Presents today for right foot issue.   He has noticed bleeding past 2-3 weeks. Intermittent. He notes bleeding, spouse has not noted bleeding. Toe feels cold. Some soreness in area. No known injury. New shoes few weeks ago. No preceding fall.  Previously had great toenail removed.  Does follow-up with podiatry intermittently for nail trimming, next appointment not for a few months. Tx: various creams, over the counter.       History Patient Active Problem List   Diagnosis Date Noted   Vasomotor rhinitis 06/28/2020   Sensorineural hearing loss (SNHL), bilateral 06/28/2020   Movement disorder 10/21/2019   Dementia with behavioral disturbance ([place]) 10/21/2019   Lewy body dementia ([place]) 01/01/2019   Sepsis ([place]) 123456   Acute  metabolic encephalopathy 123456   Thrombocytopenia ([place]) 09/10/2018   Total bilirubin, elevated 09/10/2018   Cerebellar ataxia in diseases classified elsewhere ([place]) 09/17/2017   REM sleep behavior disorder 09/17/2017   Chronic pain syndrome 05/21/2017   Tremor 05/08/2017   Gait abnormality 05/08/2017   Transient hypotension 05/05/2017   Contracture of joint of finger 04/16/2017   Long-term current use of opiate analgesic 04/02/2017   Malnutrition of moderate degree 03/28/2017   Syncope and collapse 03/26/2017   Closed fracture of shaft of left humerus 03/19/2017   Subungual hematoma of great toe of right foot 12/13/2016   Pain of toe of right foot 12/13/2016   Paresthesia 06/29/2016   Tremor of right hand 06/29/2016   Mild cognitive impairment 06/29/2016   Tremors of nervous system 06/22/2016   Drooling 06/22/2016   Loss of weight 06/22/2016   Family history of throat cancer 06/22/2016   Abnormality of gait 06/09/2016   Memory loss 12/31/2013   Arthritis    Myoclonic jerking    Right inguinal hernia [id number]   Umbilical hernia [id number]   Acute pulmonary embolus ([place]) 11/07/2009   DVT of lower extremity (deep venous thrombosis) ([place]) 11/07/2009   Past Medical History:  Diagnosis Date   Acute pulmonary embolus ([place]) 11/07/2009   pt denies   Arthritis    neck - limited range of motion; back, shoulders   Chronic back pain    Difficult intubation    DJD in back and neck, pt unable to lay flat.   DJD (degenerative joint disease)  DVT of lower extremity (deep venous thrombosis) (HCC) 11/07/2009   Occasional tremors 03/2017   HANDS    Parkinson's disease (HCC)    Right inguinal hernia 02/2011   Seizures (HCC)    pt denies   Syncope and collapse 03/26/2017   Umbilical hernia 02/2011   Past Surgical History:  Procedure Laterality Date   CLOSED REDUCTION FINGER WITH PERCUTANEOUS PINNING Left 06/26/2018   Procedure: CLOSED REDUCTION FINGER WITH PERCUTANEOUS  PINNING;  Surgeon: Ernest Mallick, MD;  Location: [facility];  Service: Orthopedics;  Laterality: Left;   CYSTOSCOPY WITH URETHRAL DILATATION N/A 11/02/2020   Procedure: CYSTOSCOPY WITH BULBAR URETHRAL DILATATION;  Surgeon: [name], MD;  Location: [facility];  Service: Urology;  Laterality: N/A;   HERNIA REPAIR     I & D EXTREMITY Left 06/26/2018   Procedure: Left Index finger wound exploration, irrigation and debridement, fracture fixation, flexor tendon repair, digital nerve repair with conduit as needed and surgery as indicated;  Surgeon: Ernest Mallick, MD;  Location: [facility];  Service: Orthopedics;  Laterality: Left;    INGUINAL HERNIA REPAIR  03/17/2011   Procedure: HERNIA REPAIR INGUINAL ADULT;  Surgeon: Ernestene Mention, MD;  Location: [facility];  Service: General;  Laterality: Right;  right inguinal hernia repair with mesh   KNEE SURGERY  1977   left   NERVE AND TENDON REPAIR Left 06/26/2018   Procedure: NERVE AND TENDON REPAIR;  Surgeon: Ernest Mallick, MD;  Location: [facility];  Service: Orthopedics;  Laterality: Left;   UMBILICAL HERNIA REPAIR  03/17/2011   Procedure: HERNIA REPAIR UMBILICAL ADULT;  Surgeon: Ernestene Mention, MD;  Location: [facility];  Service: General;  Laterality: N/A;  umbilical hernia repair with mesh   No Known Allergies Prior to Admission medications   Medication Sig Start Date End Date Taking? Authorizing Provider  Carbidopa-Levodopa ER (SINEMET CR) 25-100 MG tablet controlled release Take 1 tablet by mouth in the morning, at noon, and at bedtime. 10/21/19   Levert Feinstein, MD  finasteride (PROSCAR) 5 MG tablet Take 5 mg by mouth daily.    [provider]  HYDROcodone-acetaminophen (NORCO) 10-325 MG tablet Take 1 tablet by mouth 5 (five) times daily as needed for moderate pain. 08/02/18   [provider]  ondansetron  (ZOFRAN-ODT) 4 MG disintegrating tablet Take 1 tablet (4 mg total) by mouth every 8 (eight) hours as needed for nausea or vomiting. 10/24/21   Lamptey, Britta Mccreedy, MD  tamsulosin (FLOMAX) 0.4 MG CAPS capsule Take 0.4 mg by mouth.    [provider]  vitamin B-12 (CYANOCOBALAMIN) 500 MCG tablet Take 1 tablet (500 mcg total) by mouth daily. 09/13/18   Erick Blinks, MD   Social History   Socioeconomic History   Marital status: Married    Spouse name: [name]   Number of children: 5   Years of education: college   Highest education level: Not on file  Occupational History   Occupation: DIRECTOR    Employer: [employer]  Tobacco Use   Smoking status: Never   Smokeless tobacco: Never  Vaping Use   Vaping Use: Never used  Substance and Sexual Activity   Alcohol use: No   Drug use: No   Sexual activity: Yes    Birth control/protection: Surgical  Other Topics Concern   Not on file  Social History Narrative   Patient lives at home  with his wife [name]).   Disabled.   Education college.   Right handed.   Caffeine one cup of coffee daily.   Social Determinants of Health   Financial Resource Strain: Not on file  Food Insecurity: Not on file  Transportation Needs: Not on file  Physical Activity: Not on file  Stress: Not on file  Social Connections: Not on file  Intimate Partner Violence: Not on file    Review of Systems Per HPI.   Objective:   Vitals:   11/02/21 1349  BP: 112/66  Pulse: 65  Resp: 16  Temp: 98.1 F (36.7 C)  TempSrc: Temporal  SpO2: 97%  Weight: 166 lb 12.8 oz (75.7 kg)  Height: 6\' 2"  (1.88 m)     Physical Exam Vitals reviewed.  Constitutional:      Appearance: He is well-developed.     Comments: Some verbal responses to questioning, additional history provided by spouse.  Resting tremor.  HENT:     Head: Normocephalic and atraumatic.  Neck:     Vascular: No carotid bruit or JVD.  Cardiovascular:     Rate and Rhythm:  Normal rate and regular rhythm.     Heart sounds: Normal heart sounds. No murmur heard. Pulmonary:     Effort: Pulmonary effort is normal.     Breath sounds: Normal breath sounds. No rales.  Musculoskeletal:     Right lower leg: No edema.     Left lower leg: No edema.     Comments: Right great toe with disfigured toenail, shortened with distal aspect blending into the nailbed.  No significant erythema or swelling appreciated, no bleeding.  No wounds.  Slight tenderness over the distal aspect of the toenail and nailbed area.  No focal bony tenderness but somewhat difficult history/exam.  Again appears to localize primarily to the nailbed.  Skin:    General: Skin is warm and dry.  Neurological:     Mental Status: He is alert and oriented to person, place, and time.  Psychiatric:        Mood and Affect: Mood normal.       Results for orders placed or performed in visit on 11/02/21  POCT glycosylated hemoglobin (Hb A1C)  Result Value Ref Range   Hemoglobin A1C 5.4 4.0 - 5.6 %   HbA1c POC (<> result, manual entry)     HbA1c, POC (prediabetic range)     HbA1c, POC (controlled diabetic range)       Assessment & Plan:  CAYNE KLESS is a 66 y.o. male . Pain of toe of right foot - Plan: DG Toe Great Right Toenail deformity - Plan: DG Toe Great Right  -No sign of wound or bleeding, and no bleeding has been noted by spouse.  Possible irritation at the distal aspect of the new nail growing in into the nailbed area.  However no current sign of infection, or swelling.  Will check x-ray to rule out bony findings or significant degenerative disease within the toe, but unlikely.  Advised that he follow-up with his podiatrist to evaluate treatment options including possible nail removal but does not appear to be urgent issue at this time.  RTC precautions given if any erythema, discharge, swelling, bleeding or other worsening symptoms.  Prediabetes - Plan: POCT glycosylated hemoglobin (Hb  A1C)  -History of prediabetes but normal A1c in office at this time.   Loss of weight  -Weight loss likely due in part due to his underlying disease, Lewy body dementia.  Weight is increased from August 2022.  Certainly could discuss appetite suppressant or other medications with his neurologist but would be hesitant to start new meds at this time.   Lewy body dementia with behavioral disturbance (HCC)  -Followed by neurology, continue ongoing care with that specialist.  No medication changes today.  No orders of the defined types were placed in this encounter.  Patient Instructions  Toe pain could be related to the new nail that has grown in place of previous removed nail.  X-ray can be performed at our imaging facility below but I think that will be okay.  I recommend calling your podiatrist for an appointment to evaluate that nail and treatment options.  If any redness, discharge, bleeding or worsening symptoms be seen sooner.  Keep follow-up with specialists as planned.  Good seeing you today.  Let me know if I can be of any assistance.  [facility] in 8:30-4:30 during weekdays, no appointment needed [address].  [place], [place] [address]     Signed,   [name], MD [facility], [facility] 11/02/21 2:24 PM

## 2021-11-03 ENCOUNTER — Telehealth: Payer: Self-pay

## 2021-11-03 NOTE — Telephone Encounter (Signed)
-----   Message from Shade Flood, MD sent at 11/02/2021  3:23 PM EDT ----- Call patient, spouse.  75-month blood sugar test was in the normal range and better than 6 months ago.  Let me know if there are questions.

## 2021-11-05 ENCOUNTER — Encounter: Payer: Self-pay | Admitting: Family Medicine

## 2021-12-30 ENCOUNTER — Ambulatory Visit: Payer: Medicare Other | Admitting: Podiatry

## 2021-12-30 ENCOUNTER — Encounter: Payer: Self-pay | Admitting: Podiatry

## 2021-12-30 DIAGNOSIS — L603 Nail dystrophy: Secondary | ICD-10-CM

## 2021-12-30 DIAGNOSIS — B351 Tinea unguium: Secondary | ICD-10-CM | POA: Diagnosis not present

## 2021-12-30 DIAGNOSIS — M79674 Pain in right toe(s): Secondary | ICD-10-CM | POA: Diagnosis not present

## 2021-12-30 DIAGNOSIS — D696 Thrombocytopenia, unspecified: Secondary | ICD-10-CM

## 2021-12-30 NOTE — Progress Notes (Signed)
This patient returns to my office for at risk foot care.  This patient requires this care by a professional since this patient will be at risk due to having thrombocytopenia.  This patient is unable to cut nails himself since the patient cannot reach his nails.These nails are painful walking and wearing shoes. He presents to the office with his wife. This patient presents for at risk foot care today.  General Appearance  Alert, conversant and in no acute stress.  Vascular  Dorsalis pedis and posterior tibial  pulses are palpable  bilaterally.  Capillary return is within normal limits  bilaterally. Temperature is within normal limits  bilaterally.  Neurologic  Senn-Weinstein monofilament wire test absent   bilaterally. Muscle power within normal limits bilaterally.  Nails Thick disfigured discolored nails with subungual debris  from hallux to fifth toes bilaterally. No evidence of bacterial infection or drainage bilaterally.  Orthopedic  No limitations of motion  feet .  No crepitus or effusions noted.  No bony pathology .  HAV  B/L.  Hammer toes  2-5  B/L.  Midfoot  DJD  B/L.  Skin  normotropic skin with no porokeratosis noted bilaterally.  No signs of infections or ulcers noted.     Onychomycosis  Pain in right toes  Pain in left toes  Consent was obtained for treatment procedures.   Mechanical debridement of nails 1-5  bilaterally performed with a nail nipper.  Filed with dremel without incident.    Return office visit     3   months                 Told patient to return for periodic foot care and evaluation due to potential at risk complications.   Keyonda Bickle DPM  

## 2022-01-18 DIAGNOSIS — H40013 Open angle with borderline findings, low risk, bilateral: Secondary | ICD-10-CM | POA: Diagnosis not present

## 2022-02-23 DIAGNOSIS — M5136 Other intervertebral disc degeneration, lumbar region: Secondary | ICD-10-CM | POA: Diagnosis not present

## 2022-02-23 DIAGNOSIS — Z5181 Encounter for therapeutic drug level monitoring: Secondary | ICD-10-CM | POA: Diagnosis not present

## 2022-02-23 DIAGNOSIS — M5030 Other cervical disc degeneration, unspecified cervical region: Secondary | ICD-10-CM | POA: Diagnosis not present

## 2022-02-23 DIAGNOSIS — Z79891 Long term (current) use of opiate analgesic: Secondary | ICD-10-CM | POA: Diagnosis not present

## 2022-02-23 DIAGNOSIS — G894 Chronic pain syndrome: Secondary | ICD-10-CM | POA: Diagnosis not present

## 2022-02-23 DIAGNOSIS — Z79899 Other long term (current) drug therapy: Secondary | ICD-10-CM | POA: Diagnosis not present

## 2022-03-16 DIAGNOSIS — R3914 Feeling of incomplete bladder emptying: Secondary | ICD-10-CM | POA: Diagnosis not present

## 2022-03-16 DIAGNOSIS — N130 Hydronephrosis with ureteropelvic junction obstruction: Secondary | ICD-10-CM | POA: Diagnosis not present

## 2022-04-05 ENCOUNTER — Encounter: Payer: Self-pay | Admitting: Podiatry

## 2022-04-05 ENCOUNTER — Ambulatory Visit: Payer: Medicare Other | Admitting: Podiatry

## 2022-04-05 DIAGNOSIS — D696 Thrombocytopenia, unspecified: Secondary | ICD-10-CM | POA: Diagnosis not present

## 2022-04-05 DIAGNOSIS — B351 Tinea unguium: Secondary | ICD-10-CM | POA: Diagnosis not present

## 2022-04-05 DIAGNOSIS — M79674 Pain in right toe(s): Secondary | ICD-10-CM | POA: Diagnosis not present

## 2022-04-05 DIAGNOSIS — L603 Nail dystrophy: Secondary | ICD-10-CM | POA: Diagnosis not present

## 2022-04-05 NOTE — Progress Notes (Signed)
This patient returns to my office for at risk foot care.  This patient requires this care by a professional since this patient will be at risk due to having thrombocytopenia.  This patient is unable to cut nails himself since the patient cannot reach his nails.These nails are painful walking and wearing shoes. He presents to the office with his wife. This patient presents for at risk foot care today.  General Appearance  Alert, conversant and in no acute stress.  Vascular  Dorsalis pedis and posterior tibial  pulses are palpable  bilaterally.  Capillary return is within normal limits  bilaterally. Temperature is within normal limits  bilaterally.  Neurologic  Senn-Weinstein monofilament wire test absent   bilaterally. Muscle power within normal limits bilaterally.  Nails Thick disfigured discolored nails with subungual debris  from hallux to fifth toes bilaterally. No evidence of bacterial infection or drainage bilaterally.  Orthopedic  No limitations of motion  feet .  No crepitus or effusions noted.  No bony pathology .  HAV  B/L.  Hammer toes  2-5  B/L.  Midfoot  DJD  B/L.  Skin  normotropic skin with no porokeratosis noted bilaterally.  No signs of infections or ulcers noted.     Onychomycosis  Pain in right toes  Pain in left toes  Consent was obtained for treatment procedures.   Mechanical debridement of nails 1-5  bilaterally performed with a nail nipper.  Filed with dremel without incident.    Return office visit     3   months                 Told patient to return for periodic foot care and evaluation due to potential at risk complications.   Gardiner Barefoot DPM

## 2022-04-14 DIAGNOSIS — F0282 Dementia in other diseases classified elsewhere, unspecified severity, with psychotic disturbance: Secondary | ICD-10-CM | POA: Diagnosis not present

## 2022-04-14 DIAGNOSIS — K117 Disturbances of salivary secretion: Secondary | ICD-10-CM | POA: Diagnosis not present

## 2022-04-14 DIAGNOSIS — F02818 Dementia in other diseases classified elsewhere, unspecified severity, with other behavioral disturbance: Secondary | ICD-10-CM | POA: Diagnosis not present

## 2022-04-14 DIAGNOSIS — Z79899 Other long term (current) drug therapy: Secondary | ICD-10-CM | POA: Diagnosis not present

## 2022-04-14 DIAGNOSIS — Z7409 Other reduced mobility: Secondary | ICD-10-CM | POA: Diagnosis not present

## 2022-04-14 DIAGNOSIS — G20A1 Parkinson's disease without dyskinesia, without mention of fluctuations: Secondary | ICD-10-CM | POA: Diagnosis not present

## 2022-04-21 ENCOUNTER — Other Ambulatory Visit (HOSPITAL_COMMUNITY): Payer: Self-pay | Admitting: Endocrinology

## 2022-04-21 DIAGNOSIS — R8271 Bacteriuria: Secondary | ICD-10-CM | POA: Diagnosis not present

## 2022-04-21 DIAGNOSIS — R3914 Feeling of incomplete bladder emptying: Secondary | ICD-10-CM | POA: Diagnosis not present

## 2022-04-21 DIAGNOSIS — G20A1 Parkinson's disease without dyskinesia, without mention of fluctuations: Secondary | ICD-10-CM

## 2022-04-21 DIAGNOSIS — R300 Dysuria: Secondary | ICD-10-CM | POA: Diagnosis not present

## 2022-04-24 ENCOUNTER — Ambulatory Visit (HOSPITAL_COMMUNITY)
Admission: RE | Admit: 2022-04-24 | Discharge: 2022-04-24 | Disposition: A | Discharge status: Home / Self Care | Payer: Medicare Other | Source: Ambulatory Visit | Attending: Family Medicine | Admitting: Family Medicine

## 2022-04-24 DIAGNOSIS — I493 Ventricular premature depolarization: Secondary | ICD-10-CM | POA: Insufficient documentation

## 2022-04-24 DIAGNOSIS — G20A1 Parkinson's disease without dyskinesia, without mention of fluctuations: Secondary | ICD-10-CM | POA: Insufficient documentation

## 2022-05-08 ENCOUNTER — Ambulatory Visit (INDEPENDENT_AMBULATORY_CARE_PROVIDER_SITE_OTHER): Payer: Medicare Other | Admitting: Family Medicine

## 2022-05-08 VITALS — BP 122/60 | HR 67 | Temp 98.7°F | Ht 74.0 in | Wt 161.8 lb

## 2022-05-08 DIAGNOSIS — Z23 Encounter for immunization: Secondary | ICD-10-CM | POA: Diagnosis not present

## 2022-05-08 DIAGNOSIS — G3183 Dementia with Lewy bodies: Secondary | ICD-10-CM | POA: Diagnosis not present

## 2022-05-08 DIAGNOSIS — N401 Enlarged prostate with lower urinary tract symptoms: Secondary | ICD-10-CM | POA: Diagnosis not present

## 2022-05-08 DIAGNOSIS — Z87898 Personal history of other specified conditions: Secondary | ICD-10-CM

## 2022-05-08 DIAGNOSIS — F02818 Dementia in other diseases classified elsewhere, unspecified severity, with other behavioral disturbance: Secondary | ICD-10-CM

## 2022-05-08 NOTE — Progress Notes (Unsigned)
Subjective:  Patient ID: Cory Johnson, male    DOB: [birth year]  Age: 67 y.o. MRN: [id number]  CC:  Chief Complaint  Patient presents with   Prediabetes    Pt feels well notes no questions     HPI [name] presents for   Prediabetes: History of prediabetes with normal A1c in August. Weight down from last visit. Defers testing today.  Lab Results  Component Value Date   HGBA1C 5.4 11/02/2021   Wt Readings from Last 3 Encounters:  05/08/22 161 lb 12.8 oz (73.4 kg)  11/02/21 166 lb 12.8 oz (75.7 kg)  05/04/21 168 lb 3.2 oz (76.3 kg)   Followed by neurology at [facility] for Parkinson's, Lewy body dementia, sialorrhea, dropped head syndrome and levodopa responsive symptoms.  Some progression of dementia and parkinsonism with visual examinations on lower dose Sinemet. Last office visit February 2 noted.  Overall progression of disease with worsening tremor.   Started on Nuplazid for hallucinations.  Home health therapies also ordered to prevent further decline and U step walker.  Referral was placed for Botox for the worsening sialorrhea.  Power mobility eval.  U step walker planned to allow independence in the home is much as possible at this time.  Unable to use a scooter due to difficulty with transfers and poor postural control.  Referred to PT to evaluate the U step walker, and given the progressive nature of disease without a foreseeable care also candidate for hospice/palliative care.  Referral placed to social worker.   Home health has not yet contacted - plans to call neuro to check into status.  Met with Education officer, museum. Declined any palliative care at this time.  Refuses to use the U-walker at this time.  Called about botox - afraid of side effects, so has declined that intervention at this time as well.  Has been on Nuplazid. Increased somnolence on that med - plans to discuss with neuro. Has helped with hallucinations, but other side  effects. Not sure if other dosing options.   Followed by urology, on nitrofurantoin, tamsulosin finasteride for BPH suppressive antibiotic for infection prevention. Not taking any meds rx by urology. Felt like causing skin to be fragile/discolored.  Urology is aware that he is off meds. Occasional pain to urinate off meds, small amounts at times, no abd pain.  Immunization History  Administered Date(s) Administered   Fluad Quad(high Dose 65+) 11/20/2021   PFIZER(Purple Top)SARS-COV-2 Vaccination 06/06/2019, 07/02/2019, 02/10/2020   Tdap 05/24/2017, 06/21/2018  Pneumonia vaccine - recommended today. Agreed.     History Patient Active Problem List   Diagnosis Date Noted   Vasomotor rhinitis 06/28/2020   Sensorineural hearing loss (SNHL), bilateral 06/28/2020   Movement disorder 10/21/2019   Dementia with behavioral disturbance ([place]) 10/21/2019   Lewy body dementia ([place]) 01/01/2019   Sepsis ([place]) 123456   Acute metabolic encephalopathy 123456   Thrombocytopenia ([place]) 09/10/2018   Total bilirubin, elevated 09/10/2018   Cerebellar ataxia in diseases classified elsewhere ([place]) 09/17/2017   REM sleep behavior disorder 09/17/2017   Chronic pain syndrome 05/21/2017   Tremor 05/08/2017   Gait abnormality 05/08/2017   Transient hypotension 05/05/2017   Contracture of joint of finger 04/16/2017   Long-term current use of opiate analgesic 04/02/2017   Malnutrition of moderate degree 03/28/2017   Syncope and collapse 03/26/2017   Closed fracture of shaft of left humerus 03/19/2017   Subungual hematoma of great toe of right foot 12/13/2016   Pain  of toe of right foot 12/13/2016   Paresthesia 06/29/2016   Tremor of right hand 06/29/2016   Mild cognitive impairment 06/29/2016   Tremors of nervous system 06/22/2016   Drooling 06/22/2016   Loss of weight 06/22/2016   Family history of throat cancer 06/22/2016   Abnormality of gait 06/09/2016   Memory loss 12/31/2013   Arthritis     Myoclonic jerking    Right inguinal hernia [id number]   Umbilical hernia [id number]   Acute pulmonary embolus ([place]) 11/07/2009   DVT of lower extremity (deep venous thrombosis) ([place]) 11/07/2009   Past Medical History:  Diagnosis Date   Acute pulmonary embolus ([place]) 11/07/2009   pt denies   Arthritis    neck - limited range of motion; back, shoulders   Chronic back pain    Difficult intubation    DJD in back and neck, pt unable to lay flat.   DJD (degenerative joint disease)    DVT of lower extremity (deep venous thrombosis) ([place]) 11/07/2009   Occasional tremors 03/2017   HANDS    Parkinson's disease    Right inguinal hernia 02/2011   Seizures ([place])    pt denies   Syncope and collapse [id number]   Umbilical hernia 0000000   Past Surgical History:  Procedure Laterality Date   CLOSED REDUCTION FINGER WITH PERCUTANEOUS PINNING Left 06/26/2018   Procedure: CLOSED REDUCTION FINGER WITH PERCUTANEOUS PINNING;  Surgeon: Verner Mould, MD;  Location: [facility];  Service: Orthopedics;  Laterality: Left;   CYSTOSCOPY WITH URETHRAL DILATATION N/A 11/02/2020   Procedure: CYSTOSCOPY WITH BULBAR URETHRAL DILATATION;  Surgeon: Remi Haggard, MD;  Location[facility];  Service: Urology;  Laterality: N/A;   HERNIA REPAIR     I & D EXTREMITY Left 06/26/2018   Procedure: Left Index finger wound exploration, irrigation and debridement, fracture fixation, flexor tendon repair, digital nerve repair with conduit as needed and surgery as indicated;  Surgeon: Verner Mould, MD;  Location: [facility];  Service: Orthopedics;  Laterality: Left;  67mn   INGUINAL HERNIA REPAIR  03/17/2011   Procedure: HERNIA REPAIR INGUINAL ADULT;  Surgeon: HAdin Hector MD;  [facility]: [facility]  Service: General;  Laterality: Right;  right inguinal hernia repair with mesh   KNEE SURGERY  1977   left   NERVE AND TENDON REPAIR Left  06/26/2018   Procedure: NERVE AND TENDON REPAIR;  Surgeon: CVerner Mould MD;  [facility]  Service: Orthopedics;  Laterality: Left;   UMBILICAL HERNIA REPAIR  03/17/2011   Procedure: HERNIA REPAIR UMBILICAL ADULT;  Surgeon: HAdin Hector MD;  [facility]  Service: General;  Laterality: N/A;  umbilical hernia repair with mesh   No Known Allergies Prior to Admission medications   Medication Sig Start Date End Date Taking? Authorizing Provider  Carbidopa-Levodopa ER (SINEMET CR) 25-100 MG tablet controlled release Take 1 tablet by mouth in the morning, at noon, and at bedtime. 10/21/19  Yes YMarcial Pacas MD  finasteride (PROSCAR) 5 MG tablet Take 5 mg by mouth daily.   Yes [provider]  HYDROcodone-acetaminophen (NORCO) 10-325 MG tablet Take 1 tablet by mouth 5 (five) times daily as needed for moderate pain. 08/02/18  Yes [provider]  levofloxacin (LEVAQUIN) 500 MG tablet Take 1 tablet by mouth daily.   Yes [provider]  nitrofurantoin (MACRODANTIN) 50 MG capsule Take 1 capsule by mouth daily. 09/25/21  Yes [provider]  ondansetron (ZOFRAN-ODT) 4 MG disintegrating tablet Take 1 tablet (4 mg total) by mouth every 8 (eight) hours as needed for nausea or vomiting. 10/24/21  Yes Lamptey, Myrene Galas, MD  tamsulosin (FLOMAX) 0.4 MG CAPS capsule Take 0.4 mg by mouth.   Yes [provider]  vitamin B-12 (CYANOCOBALAMIN) 500 MCG tablet Take 1 tablet (500 mcg total) by mouth daily. 09/13/18  Yes Kathie Dike, MD   Social History   Socioeconomic History   Marital status: Married    Spouse name: [name]   Number of children: 5   Years of education: college   Highest education level: Not on file  Occupational History   Occupation: DIRECTOR    Employer: [employer]  Tobacco Use   Smoking status: Never   Smokeless tobacco: Never  Vaping Use   Vaping Use: Never used  Substance  and Sexual Activity   Alcohol use: No   Drug use: No   Sexual activity: Yes    Birth control/protection: Surgical  Other Topics Concern   Not on file  Social History Narrative   Patient lives at home with his wife [name]).   Disabled.   Education college.   Right handed.   Caffeine one cup of coffee daily.   Social Determinants of Health   Financial Resource Strain: Not on file  Food Insecurity: Not on file  Transportation Needs: Not on file  Physical Activity: Not on file  Stress: Not on file  Social Connections: Not on file  Intimate Partner Violence: Not on file    Review of Systems Per hpi.   Objective:   Vitals:   05/08/22 1344  BP: 122/60  Pulse: 67  Temp: 98.7 F (37.1 C)  TempSrc: Temporal  SpO2: 97%  Weight: 161 lb 12.8 oz (73.4 kg)  Height: '6\' 2"'$  (1.88 m)     Physical Exam Vitals reviewed.  Constitutional:      Appearance: He is well-developed.  HENT:     Head: Normocephalic and atraumatic.  Neck:     Vascular: No carotid bruit or JVD.  Cardiovascular:     Rate and Rhythm: Normal rate and regular rhythm.     Heart sounds: Normal heart sounds. No murmur heard. Pulmonary:     Effort: Pulmonary effort is normal.     Breath sounds: Normal breath sounds. No rales.  Abdominal:     General: Abdomen is flat. There is no distension.     Tenderness: There is no abdominal tenderness (No suprapubic tenderness.  No CVA tenderness.).  Musculoskeletal:     Right lower leg: No edema.     Left lower leg: No edema.  Skin:    General: Skin is warm and dry.  Neurological:     Mental Status: He is alert.     Motor: Tremor present.     Comments: Sialorrhea.   Psychiatric:        Mood and Affect: Mood normal.     37 minutes spent during visit, including chart review, neurology note and plan review, counseling and assimilation of information, exam, discussion of plan, ER precautions ,and chart completion.     Assessment & Plan:  YANIEL FOSSELMAN is  a 67 y.o. male . Lewy body dementia with behavioral disturbance ([place])  -Agreed with walker for stability, fall prevention.  Recommend he consider new walker as recommended by neuro as well as home health therapies.  Not interested in hospice/palliative care at this time.  Denies  additional needs for me at this time.  Need for prophylactic vaccination against Streptococcus pneumoniae (pneumococcus) - Plan: Pneumococcal conjugate vaccine 20-valent (Prevnar 20) given.  Benign prostatic hyperplasia with lower urinary tract symptoms, symptom details unspecified  -Medication nonadherence as above, unlikely cause of thinning of skin.  Does have some urinary hesitancy, likely worsening control BPH.  Risk of urinary retention, infection discussed off meds recommend he restart tamsulosin, finasteride.  ER precautions with symptoms of retention or infection.  History of prediabetes Most recent A1c normal.  Hold on labs at this time, 26-monthfollow-up.  No orders of the defined types were placed in this encounter.  Patient Instructions  It is unlikely that the prostate meds caused thinning of the skin. Follow up with urology as planned, but I would restarting those meds, especially if any trouble urinating. If any abdominal pain or fever be seen right away.   Keep follow up with specialist. I do recommend considering the home health recommendations and walker to lessen risk of falling.   Good to see you today. Please let me know if there are questions.      Signed,   JMerri Ray MD [facility] 05/08/22 3:01 PM

## 2022-05-08 NOTE — Patient Instructions (Addendum)
It is unlikely that the prostate meds caused thinning of the skin. Follow up with urology as planned, but I would restarting those meds, especially if any trouble urinating. If any abdominal pain or fever be seen right away.   Keep follow up with specialist. I do recommend considering the home health recommendations and walker to lessen risk of falling.   Good to see you today. Please let me know if there are questions.

## 2022-05-09 ENCOUNTER — Encounter: Payer: Self-pay | Admitting: Family Medicine

## 2022-05-24 ENCOUNTER — Telehealth: Payer: Self-pay | Admitting: Family Medicine

## 2022-05-24 NOTE — Telephone Encounter (Signed)
Contacted Cory Johnson to schedule their annual wellness visit. Appointment made for 06/01/2022.  Thank you,  [place] Direct dial  [phone]

## 2022-05-26 ENCOUNTER — Telehealth: Payer: Self-pay | Admitting: Family Medicine

## 2022-05-26 NOTE — Telephone Encounter (Signed)
I have received a direct staff message from hospice.  They are requesting information regarding 6 months life expectancy or less, whether or not I will serve as patient's attending, and whether comfort measures or to be ordered by me or hospice attending.  Asked that comfort measures in the care of the ordered by hospice attending but I cannot serve as patient's attending as his PCP.  In regards to life expectancy will need to clarify with his treating neurologist.  Has been information will reach back out to hospice to provide information for CTI.    1:17 PM Addendum - 05/29/22 Discussed with [name].  Based on current condition on last visit it is reasonable expect 6 months or less life expectancy given his current condition.  Will advise hospice.

## 2022-05-26 NOTE — Telephone Encounter (Signed)
Caller name: [facility]   On DPR?: Yes  Call back number: [phone]  Provider they see: [name], MD  Reason for call:For documentation does Dr Carlota Raspberry agree life expectancy of 6 months or less? (CTI)  Pt family requested Dr Carlota Raspberry hospice attending does he agree with that?  Contingent on agreeing- does he want  to give comfort care orders or does want to defer them to the hospice doctors?

## 2022-05-26 NOTE — Telephone Encounter (Signed)
Home health call requesting some information about the patients health. Please advise

## 2022-06-01 ENCOUNTER — Ambulatory Visit (INDEPENDENT_AMBULATORY_CARE_PROVIDER_SITE_OTHER): Payer: Medicare Other | Admitting: *Deleted

## 2022-06-01 ENCOUNTER — Telehealth: Payer: Self-pay

## 2022-06-01 DIAGNOSIS — Z0000 Encounter for general adult medical examination without abnormal findings: Secondary | ICD-10-CM | POA: Diagnosis not present

## 2022-06-01 NOTE — Patient Instructions (Signed)
Cory Johnson , Thank you for taking time to come for your Medicare Wellness Visit. I appreciate your ongoing commitment to your health goals. Please review the following plan we discussed and let me know if I can assist you in the future.   Screening recommendations/referrals: Colonoscopy: no longer required Recommended yearly ophthalmology/optometry visit for glaucoma screening and checkup Recommended yearly dental visit for hygiene and checkup  Vaccinations: Influenza vaccine: Education provided Pneumococcal vaccine: up to date Tdap vaccine: up to date Shingles vaccine: Education provided    Advanced directives: Education provided  Conditions/risks identified:     Preventive Care 69 Years and Older, Male Preventive care refers to lifestyle choices and visits with your health care provider that can promote health and wellness. What does preventive care include? A yearly physical exam. This is also called an annual well check. Dental exams once or twice a year. Routine eye exams. Ask your health care provider how often you should have your eyes checked. Personal lifestyle choices, including: Daily care of your teeth and gums. Regular physical activity. Eating a healthy diet. Avoiding tobacco and drug use. Limiting alcohol use. Practicing safe sex. Taking low doses of aspirin every day. Taking vitamin and mineral supplements as recommended by your health care provider. What happens during an annual well check? The services and screenings done by your health care provider during your annual well check will depend on your age, overall health, lifestyle risk factors, and family history of disease. Counseling  Your health care provider may ask you questions about your: Alcohol use. Tobacco use. Drug use. Emotional well-being. Home and relationship well-being. Sexual activity. Eating habits. History of falls. Memory and ability to understand (cognition). Work and work  Statistician. Screening  You may have the following tests or measurements: Height, weight, and BMI. Blood pressure. Lipid and cholesterol levels. These may be checked every 5 years, or more frequently if you are over 18 years old. Skin check. Lung cancer screening. You may have this screening every year starting at age 69 if you have a 30-pack-year history of smoking and currently smoke or have quit within the past 15 years. Fecal occult blood test (FOBT) of the stool. You may have this test every year starting at age 69. Flexible sigmoidoscopy or colonoscopy. You may have a sigmoidoscopy every 5 years or a colonoscopy every 10 years starting at age 69. Prostate cancer screening. Recommendations will vary depending on your family history and other risks. Hepatitis C blood test. Hepatitis B blood test. Sexually transmitted disease (STD) testing. Diabetes screening. This is done by checking your blood sugar (glucose) after you have not eaten for a while (fasting). You may have this done every 1-3 years. Abdominal aortic aneurysm (AAA) screening. You may need this if you are a current or former smoker. Osteoporosis. You may be screened starting at age 44 if you are at high risk. Talk with your health care provider about your test results, treatment options, and if necessary, the need for more tests. Vaccines  Your health care provider may recommend certain vaccines, such as: Influenza vaccine. This is recommended every year. Tetanus, diphtheria, and acellular pertussis (Tdap, Td) vaccine. You may need a Td booster every 10 years. Zoster vaccine. You may need this after age 58. Pneumococcal 13-valent conjugate (PCV13) vaccine. One dose is recommended after age 69. Pneumococcal polysaccharide (PPSV23) vaccine. One dose is recommended after age 70. Talk to your health care provider about which screenings and vaccines you need and how often you  need them. This information is not intended to replace  advice given to you by your health care provider. Make sure you discuss any questions you have with your health care provider. Document Released: 03/26/2015 Document Revised: 11/17/2015 Document Reviewed: 12/29/2014 Elsevier Interactive Patient Education  2017 [place] Prevention in the Home Falls can cause injuries. They can happen to people of all ages. There are many things you can do to make your home safe and to help prevent falls. What can I do on the outside of my home? Regularly fix the edges of walkways and driveways and fix any cracks. Remove anything that might make you trip as you walk through a door, such as a raised step or threshold. Trim any bushes or trees on the path to your home. Use bright outdoor lighting. Clear any walking paths of anything that might make someone trip, such as rocks or tools. Regularly check to see if handrails are loose or broken. Make sure that both sides of any steps have handrails. Any raised decks and porches should have guardrails on the edges. Have any leaves, snow, or ice cleared regularly. Use sand or salt on walking paths during winter. Clean up any spills in your garage right away. This includes oil or grease spills. What can I do in the bathroom? Use night lights. Install grab bars by the toilet and in the tub and shower. Do not use towel bars as grab bars. Use non-skid mats or decals in the tub or shower. If you need to sit down in the shower, use a plastic, non-slip stool. Keep the floor dry. Clean up any water that spills on the floor as soon as it happens. Remove soap buildup in the tub or shower regularly. Attach bath mats securely with double-sided non-slip rug tape. Do not have throw rugs and other things on the floor that can make you trip. What can I do in the bedroom? Use night lights. Make sure that you have a light by your bed that is easy to reach. Do not use any sheets or blankets that are too big for your bed.  They should not hang down onto the floor. Have a firm chair that has side arms. You can use this for support while you get dressed. Do not have throw rugs and other things on the floor that can make you trip. What can I do in the kitchen? Clean up any spills right away. Avoid walking on wet floors. Keep items that you use a lot in easy-to-reach places. If you need to reach something above you, use a strong step stool that has a grab bar. Keep electrical cords out of the way. Do not use floor polish or wax that makes floors slippery. If you must use wax, use non-skid floor wax. Do not have throw rugs and other things on the floor that can make you trip. What can I do with my stairs? Do not leave any items on the stairs. Make sure that there are handrails on both sides of the stairs and use them. Fix handrails that are broken or loose. Make sure that handrails are as long as the stairways. Check any carpeting to make sure that it is firmly attached to the stairs. Fix any carpet that is loose or worn. Avoid having throw rugs at the top or bottom of the stairs. If you do have throw rugs, attach them to the floor with carpet tape. Make sure that you have a light switch  at the top of the stairs and the bottom of the stairs. If you do not have them, ask someone to add them for you. What else can I do to help prevent falls? Wear shoes that: Do not have high heels. Have rubber bottoms. Are comfortable and fit you well. Are closed at the toe. Do not wear sandals. If you use a stepladder: Make sure that it is fully opened. Do not climb a closed stepladder. Make sure that both sides of the stepladder are locked into place. Ask someone to hold it for you, if possible. Clearly mark and make sure that you can see: Any grab bars or handrails. First and last steps. Where the edge of each step is. Use tools that help you move around (mobility aids) if they are needed. These  include: Canes. Walkers. Scooters. Crutches. Turn on the lights when you go into a dark area. Replace any light bulbs as soon as they burn out. Set up your furniture so you have a clear path. Avoid moving your furniture around. If any of your floors are uneven, fix them. If there are any pets around you, be aware of where they are. Review your medicines with your doctor. Some medicines can make you feel dizzy. This can increase your chance of falling. Ask your doctor what other things that you can do to help prevent falls. This information is not intended to replace advice given to you by your health care provider. Make sure you discuss any questions you have with your health care provider. Document Released: 12/24/2008 Document Revised: 08/05/2015 Document Reviewed: 04/03/2014 Elsevier Interactive Patient Education  2017 [employer].

## 2022-06-01 NOTE — Progress Notes (Signed)
Subjective:   YOCHANAN DEMPEWOLF is a 67 y.o. male who presents for Medicare Annual/Subsequent preventive examination.  I connected with  Kyheim Vuncannon Stanbrough on 06/01/22 by a telephone enabled telemedicine application and verified that I am speaking with the correct person using two identifiers.   I discussed the limitations of evaluation and management by telemedicine. The patient expressed understanding and agreed to proceed.  Patient location: home  Provider location: home/telehealth    Review of Systems     Cardiac Risk Factors include: advanced age (>65men, >93 women);hypertension;male gender;sedentary lifestyle     Objective:    Today's Vitals   06/01/22 1303  PainSc: 7    There is no height or weight on file to calculate BMI.     06/01/2022    1:08 PM 06/01/2022    1:07 PM 05/04/2021    8:19 AM 11/02/2020   10:09 AM 01/29/2019    2:10 PM 09/10/2018    7:48 PM 06/26/2018    7:35 AM  Advanced Directives  Does Patient Have a Medical Advance Directive? No No No No No No No  Would patient like information on creating a medical advance directive? No - Patient declined No - Patient declined No - Patient declined Yes (Inpatient - patient defers creating a medical advance directive at this time - Information given) Yes (ED - Information included in AVS) No - Patient declined Yes (MAU/Ambulatory/Procedural Areas - Information given)    Current Medications (verified) Outpatient Encounter Medications as of 06/01/2022  Medication Sig   Carbidopa-Levodopa ER (SINEMET CR) 25-100 MG tablet controlled release Take 1 tablet by mouth in the morning, at noon, and at bedtime.   finasteride (PROSCAR) 5 MG tablet Take 5 mg by mouth daily.   HYDROcodone-acetaminophen (NORCO) 10-325 MG tablet Take 1 tablet by mouth 5 (five) times daily as needed for moderate pain.   levofloxacin (LEVAQUIN) 500 MG tablet Take 1 tablet by mouth daily.   nitrofurantoin (MACRODANTIN) 50 MG capsule Take 1 capsule  by mouth daily.   ondansetron (ZOFRAN-ODT) 4 MG disintegrating tablet Take 1 tablet (4 mg total) by mouth every 8 (eight) hours as needed for nausea or vomiting.   risperiDONE (RISPERDAL) 0.25 MG tablet Take 0.25 mg by mouth at bedtime. Twice daily   tamsulosin (FLOMAX) 0.4 MG CAPS capsule Take 0.4 mg by mouth.   vitamin B-12 (CYANOCOBALAMIN) 500 MCG tablet Take 1 tablet (500 mcg total) by mouth daily.   No facility-administered encounter medications on file as of 06/01/2022.    Allergies (verified) Patient has no known allergies.   History: Past Medical History:  Diagnosis Date   Acute pulmonary embolus ([place]) 11/07/2009   pt denies   Arthritis    neck - limited range of motion; back, shoulders   Chronic back pain    Difficult intubation    DJD in back and neck, pt unable to lay flat.   DJD (degenerative joint disease)    DVT of lower extremity (deep venous thrombosis) ([place]) 11/07/2009   Occasional tremors 03/2017   HANDS    Parkinson's disease    Right inguinal hernia 02/2011   Seizures ([place])    pt denies   Syncope and collapse [id number]   Umbilical hernia [id number]   Past Surgical History:  Procedure Laterality Date   CLOSED REDUCTION FINGER WITH PERCUTANEOUS PINNING Left 06/26/2018   Procedure: CLOSED REDUCTION FINGER WITH PERCUTANEOUS PINNING;  Surgeon: Verner Mould, MD;  Location: [facility];  Service: Orthopedics;  Laterality: Left;   CYSTOSCOPY WITH URETHRAL DILATATION N/A 11/02/2020   Procedure: CYSTOSCOPY WITH BULBAR URETHRAL DILATATION;  Surgeon: Remi Haggard, MD;  Location: [facility];  Service: Urology;  Laterality: N/A;   HERNIA REPAIR     I & D EXTREMITY Left 06/26/2018   Procedure: Left Index finger wound exploration, irrigation and debridement, fracture fixation, flexor tendon repair, digital nerve repair with conduit as needed and surgery as indicated;  Surgeon: Verner Mould, MD;  Location: [facility];  Service: Orthopedics;  Laterality: Left;  85min   INGUINAL HERNIA REPAIR  03/17/2011   Procedure: HERNIA REPAIR INGUINAL ADULT;  Surgeon: Adin Hector, MD;  Location: [facility];  Service: General;  Laterality: Right;  right inguinal hernia repair with mesh   KNEE SURGERY  1977   left   NERVE AND TENDON REPAIR Left 06/26/2018   Procedure: NERVE AND TENDON REPAIR;  Surgeon: Verner Mould, MD;  Location: [facility];  Service: Orthopedics;  Laterality: Left;   UMBILICAL HERNIA REPAIR  03/17/2011   Procedure: HERNIA REPAIR UMBILICAL ADULT;  Surgeon: Adin Hector, MD;  Location: [facility];  Service: General;  Laterality: N/A;  umbilical hernia repair with mesh   Family History  Problem Relation Age of Onset   Hypertension Other    Social History   Socioeconomic History   Marital status: Married    Spouse name: [name]   Number of children: 5   Years of education: college   Highest education level: Not on file  Occupational History   Occupation: DIRECTOR    Employer: [employer]  Tobacco Use   Smoking status: Never   Smokeless tobacco: Never  Vaping Use   Vaping Use: Never used  Substance and Sexual Activity   Alcohol use: No   Drug use: No   Sexual activity: Yes    Birth control/protection: Surgical  Other Topics Concern   Not on file  Social History Narrative   Patient lives at home with his wife [name]).   Disabled.   Education college.   Right handed.   Caffeine one cup of coffee daily.   Social Determinants of Health   Financial Resource Strain: Low Risk  (06/01/2022)   Overall Financial Resource Strain (CARDIA)    Difficulty of Paying Living Expenses: Not very hard  Food Insecurity: No Food Insecurity (06/01/2022)   Hunger Vital Sign    Worried About Running Out of Food in the Last Year: Never true    Ran Out of Food in the Last Year: Never true  Transportation Needs: No  Transportation Needs (06/01/2022)   PRAPARE - Hydrologist (Medical): No    Lack of Transportation (Non-Medical): No  Physical Activity: Insufficiently Active (06/01/2022)   Exercise Vital Sign    Days of Exercise per Week: 3 days    Minutes of Exercise per Session: 40 min  Stress: No Stress Concern Present (06/01/2022)   [place]    Feeling of Stress : Not at all  Social Connections: [place] (06/01/2022)   Social Connection and Isolation Panel [NHANES]    Frequency of Communication with Friends and Family: More than three times a week    Frequency of Social Gatherings with Friends and Family: More than three times a week    Attends Religious Services: More than 4 times per year  Active Member of Clubs or Organizations: Yes    Attends Music therapist: More than 4 times per year    Marital Status: Married    Tobacco Counseling Counseling given: Not Answered   Clinical Intake:  Pre-visit preparation completed: Yes  Pain : 0-10 Pain Score: 7  Pain Type: Chronic pain Pain Location: Back Pain Descriptors / Indicators: Burning, Aching, Dull, Constant Pain Onset: More than a month ago Pain Frequency: Constant Pain Relieving Factors: hydrocodone  Pain Relieving Factors: hydrocodone  Diabetes: No CBG done?: No Did pt. bring in CBG monitor from home?: No  How often do you need to have someone help you when you read instructions, pamphlets, or other written materials from your doctor or pharmacy?: 2 - Rarely  Diabetic?  no  Interpreter Needed?: No  Information entered by :: Leroy Kennedy LPN   Activities of Daily Living    06/01/2022    1:16 PM  In your present state of health, do you have any difficulty performing the following activities:  Hearing? 0  Vision? 0  Difficulty concentrating or making decisions? 0  Walking or climbing stairs? 0  Dressing  or bathing? 1  Doing errands, shopping? 1  Preparing Food and eating ? Y  Using the Toilet? Y  In the past six months, have you accidently leaked urine? Y  Do you have problems with loss of bowel control? N  Managing your Medications? Y  Managing your Finances? Y  Housekeeping or managing your Housekeeping? Y    Patient Care Team: [name], MD as PCP - General (Family Medicine) Remi Haggard, MD as Consulting Physician (Urology) Marcial Pacas, MD as Consulting Physician (Neurology) Gardiner Barefoot, DPM as Consulting Physician (Podiatry) Suella Broad, MD as Consulting Physician (Physical Medicine and Rehabilitation)  Indicate any recent Medical Services you may have received from other than [facility] providers in the past year (date may be approximate).     Assessment:   This is a routine wellness examination for Alric.  Hearing/Vision screen Hearing Screening - Comments:: No trouble hearing Vision Screening - Comments:: [place] Up to date  Dietary issues and exercise activities discussed: Current Exercise Habits: Structured exercise class, Time (Minutes): 30, Frequency (Times/Week): 3, Weekly Exercise (Minutes/Week): 90   Goals Addressed             This Visit's Progress    Patient Stated       No goals       Depression Screen    06/01/2022    1:15 PM 11/02/2021    1:53 PM 05/04/2021    8:11 AM 01/23/2020   11:49 AM 01/09/2020   10:31 AM 05/15/2019    2:13 PM 04/30/2019    3:30 PM  PHQ 2/9 Scores  PHQ - 2 Score 0 0 0 0 0 0   PHQ- 9 Score 0  7      Exception Documentation       Patient refusal    Fall Risk    06/01/2022    1:09 PM 11/02/2021    1:52 PM 05/04/2021    8:10 AM 01/23/2020   11:49 AM 01/09/2020   10:31 AM  [place] in the past year? 0 0 0 0 0  Number falls in past yr:  0 0  0  Injury with Fall?  0 0  0  Risk for fall due to : Impaired balance/gait No Fall Risks No Fall Risks    Follow up  Falls evaluation  completed;Education provided;Falls prevention discussed Falls evaluation completed Falls evaluation completed Falls evaluation completed Falls evaluation completed    FALL RISK PREVENTION PERTAINING TO THE HOME:  Any stairs in or around the home? Yes  If so, are there any without handrails? No  Home free of loose throw rugs in walkways, pet beds, electrical cords, etc? Yes  Adequate lighting in your home to reduce risk of falls? Yes   ASSISTIVE DEVICES UTILIZED TO PREVENT FALLS:  Life alert? No  Use of a cane, walker or w/c? No  Grab bars in the bathroom? Yes  Shower chair or bench in shower? Yes  Elevated toilet seat or a handicapped toilet? Yes   TIMED UP AND GO:  Was the test performed? No .    Cognitive Function:    09/17/2017    3:13 PM 05/08/2017    8:59 AM 06/29/2016    2:04 PM 06/09/2016    8:00 AM  MMSE - Mini Mental State Exam  Orientation to time 5 5 5 4   Orientation to Place 5 5 5 5   Registration 3 3 3 3   Attention/ Calculation 5 3 5 5   Recall 0 1 1 2   Language- name 2 objects 2 2 2 2   Language- repeat 1 0 1 1  Language- follow 3 step command 3 3 3 3   Language- read & follow direction 1 1 1 1   Write a sentence 1 1 1 1   Copy design 1 1 1 1   Total score 27 25 28 28       10/21/2019   11:00 AM  Montreal Cognitive Assessment   Visuospatial/ Executive (0/5) 1  Naming (0/3) 3  Attention: Read list of digits (0/2) 1  Attention: Read list of letters (0/1) 0  Attention: Serial 7 subtraction starting at 100 (0/3) 0  Language: Repeat phrase (0/2) 1  Language : Fluency (0/1) 0  Abstraction (0/2) 2  Delayed Recall (0/5) 2  Orientation (0/6) 5  Total 15      06/01/2022    1:11 PM 05/04/2021    8:11 AM 01/29/2019    2:07 PM  6CIT Screen  What Year? 4 points 0 points 0 points  What month? 0 points 0 points 0 points  What time? 0 points 0 points   Count back from 20  0 points 0 points  Months in reverse 4 points 4 points 0 points  Repeat phrase 8 points 2  points 0 points  Total Score  6 points     Immunizations Immunization History  Administered Date(s) Administered   Fluad Quad(high Dose 65+) 11/20/2021   PFIZER(Purple Top)SARS-COV-2 Vaccination 06/06/2019, 07/02/2019, 02/10/2020   PNEUMOCOCCAL CONJUGATE-20 05/08/2022   Tdap 05/24/2017, 06/21/2018    TDAP status: Up to date  Flu Vaccine status: Up to date  Pneumococcal vaccine status: Up to date  Covid-19 vaccine status: Information provided on how to obtain vaccines.   Qualifies for Shingles Vaccine? Yes   Zostavax completed No   Shingrix Completed?: No.    Education has been provided regarding the importance of this vaccine. Patient has been advised to call insurance company to determine out of pocket expense if they have not yet received this vaccine. Advised may also receive vaccine at local pharmacy or Health Dept. Verbalized acceptance and understanding.  Screening Tests Health Maintenance  Topic Date Due   Zoster Vaccines- Shingrix (1 of 2) Never done   COVID-19 Vaccine (4 - 2023-24 season) 11/11/2021   Hepatitis C Screening  05/09/2023 (Originally 09/06/1973)   Medicare Annual Wellness (AWV)  06/01/2023   COLONOSCOPY (Pts 45-38yrs Insurance coverage will need to be confirmed)  05/30/2026   DTaP/Tdap/Td (3 - Td or Tdap) 06/20/2028   Pneumonia Vaccine 68+ Years old  Completed   INFLUENZA VACCINE  Completed   HPV VACCINES  Aged Out    Health Maintenance  Health Maintenance Due  Topic Date Due   Zoster Vaccines- Shingrix (1 of 2) Never done   COVID-19 Vaccine (4 - 2023-24 season) 11/11/2021    Colorectal cancer screening: No longer required.   Lung Cancer Screening: (Low Dose CT Chest recommended if Age 3-80 years, 30 pack-year currently smoking OR have quit w/in 15years.) does not qualify.   Lung Cancer Screening Referral:   Additional Screening:  Hepatitis C Screening: does qualify  Vision Screening: Recommended annual ophthalmology exams for early  detection of glaucoma and other disorders of the eye. Is the patient up to date with their annual eye exam?  Yes  Who is the provider or what is the name of the office in which the patient attends annual eye exams? [place] [facility] If pt is not established with a provider, would they like to be referred to a provider to establish care? No .   Dental Screening: Recommended annual dental exams for proper oral hygiene  Community Resource Referral / Chronic Care Management: CRR required this visit?  No   CCM required this visit?  No      Plan:     I have personally reviewed and noted the following in the patient's chart:   Medical and social history Use of alcohol, tobacco or illicit drugs  Current medications and supplements including opioid prescriptions. Patient is currently taking opioid prescriptions. Information provided to patient regarding non-opioid alternatives. Patient advised to discuss non-opioid treatment plan with their provider. Functional ability and status Nutritional status Physical activity Advanced directives List of other physicians Hospitalizations, surgeries, and ER visits in previous 12 months Vitals Screenings to include cognitive, depression, and falls Referrals and appointments  In addition, I have reviewed and discussed with patient certain preventive protocols, quality metrics, and best practice recommendations. A written personalized care plan for preventive services as well as general preventive health recommendations were provided to patient.     Leroy Kennedy, LPN   [id number]   Nurse Notes: Patient stated they are not ready for [place] right now.   States that they were not eligible through [facility] for other resources.

## 2022-06-01 NOTE — Telephone Encounter (Signed)
(  10:55 am) PC SW left a message for patient and his wife-[name] requesting a call back regarding palliative care referral

## 2022-06-14 ENCOUNTER — Encounter: Payer: Self-pay | Admitting: Podiatry

## 2022-06-14 ENCOUNTER — Ambulatory Visit (INDEPENDENT_AMBULATORY_CARE_PROVIDER_SITE_OTHER): Payer: Medicare Other | Admitting: Podiatry

## 2022-06-14 DIAGNOSIS — M79674 Pain in right toe(s): Secondary | ICD-10-CM | POA: Diagnosis not present

## 2022-06-14 DIAGNOSIS — B351 Tinea unguium: Secondary | ICD-10-CM | POA: Diagnosis not present

## 2022-06-14 DIAGNOSIS — F03918 Unspecified dementia, unspecified severity, with other behavioral disturbance: Secondary | ICD-10-CM

## 2022-06-14 DIAGNOSIS — D696 Thrombocytopenia, unspecified: Secondary | ICD-10-CM | POA: Diagnosis not present

## 2022-06-14 NOTE — Progress Notes (Signed)
This patient returns to my office for at risk foot care.  This patient requires this care by a professional since this patient will be at risk due to having thrombocytopenia.  This patient is unable to cut nails himself since the patient cannot reach his nails.These nails are painful walking and wearing shoes. He presents to the office with his wife. This patient presents for at risk foot care today.  General Appearance  Alert, conversant and in no acute stress.  Vascular  Dorsalis pedis and posterior tibial  pulses are palpable  bilaterally.  Capillary return is within normal limits  bilaterally. Temperature is within normal limits  bilaterally.  Neurologic  Senn-Weinstein monofilament wire test absent   bilaterally. Muscle power within normal limits bilaterally.  Nails Thick disfigured discolored nails with subungual debris  from hallux to fifth toes bilaterally. No evidence of bacterial infection or drainage bilaterally.  Orthopedic  No limitations of motion  feet .  No crepitus or effusions noted.  No bony pathology .  HAV  B/L.  Hammer toes  2-5  B/L.  Midfoot  DJD  B/L.  Skin  normotropic skin with no porokeratosis noted bilaterally.  No signs of infections or ulcers noted.     Onychomycosis  Pain in right toes  Pain in left toes  Consent was obtained for treatment procedures.   Mechanical debridement of nails 1-5  bilaterally performed with a nail nipper.  Filed with dremel without incident.    Return office visit     3   months                 Told patient to return for periodic foot care and evaluation due to potential at risk complications.   Vandella Ord DPM  

## 2022-06-22 ENCOUNTER — Emergency Department (HOSPITAL_COMMUNITY): Payer: Medicare Other

## 2022-06-22 ENCOUNTER — Encounter (HOSPITAL_COMMUNITY): Payer: Self-pay

## 2022-06-22 ENCOUNTER — Ambulatory Visit (HOSPITAL_COMMUNITY)
Admission: EM | Admit: 2022-06-22 | Discharge: 2022-06-22 | Disposition: A | Discharge status: Home / Self Care | Payer: Medicare Other

## 2022-06-22 ENCOUNTER — Emergency Department (HOSPITAL_COMMUNITY)
Admission: EM | Admit: 2022-06-22 | Discharge: 2022-06-22 | Disposition: A | Discharge status: Home / Self Care | Payer: Medicare Other | Attending: Emergency Medicine | Admitting: Emergency Medicine

## 2022-06-22 ENCOUNTER — Ambulatory Visit (HOSPITAL_COMMUNITY): Payer: Medicare Other

## 2022-06-22 DIAGNOSIS — M25512 Pain in left shoulder: Secondary | ICD-10-CM

## 2022-06-22 DIAGNOSIS — W19XXXA Unspecified fall, initial encounter: Secondary | ICD-10-CM

## 2022-06-22 DIAGNOSIS — S20212A Contusion of left front wall of thorax, initial encounter: Secondary | ICD-10-CM | POA: Diagnosis not present

## 2022-06-22 DIAGNOSIS — I10 Essential (primary) hypertension: Secondary | ICD-10-CM | POA: Diagnosis not present

## 2022-06-22 DIAGNOSIS — S40012A Contusion of left shoulder, initial encounter: Secondary | ICD-10-CM

## 2022-06-22 DIAGNOSIS — M25532 Pain in left wrist: Secondary | ICD-10-CM

## 2022-06-22 DIAGNOSIS — Z0389 Encounter for observation for other suspected diseases and conditions ruled out: Secondary | ICD-10-CM | POA: Diagnosis not present

## 2022-06-22 DIAGNOSIS — R0781 Pleurodynia: Secondary | ICD-10-CM | POA: Diagnosis not present

## 2022-06-22 DIAGNOSIS — R9431 Abnormal electrocardiogram [ECG] [EKG]: Secondary | ICD-10-CM | POA: Diagnosis not present

## 2022-06-22 DIAGNOSIS — M25522 Pain in left elbow: Secondary | ICD-10-CM | POA: Diagnosis not present

## 2022-06-22 DIAGNOSIS — S20219A Contusion of unspecified front wall of thorax, initial encounter: Secondary | ICD-10-CM

## 2022-06-22 DIAGNOSIS — W1830XA Fall on same level, unspecified, initial encounter: Secondary | ICD-10-CM | POA: Insufficient documentation

## 2022-06-22 DIAGNOSIS — G20C Parkinsonism, unspecified: Secondary | ICD-10-CM | POA: Diagnosis not present

## 2022-06-22 LAB — URINALYSIS, ROUTINE W REFLEX MICROSCOPIC
Bilirubin Urine: NEGATIVE
Glucose, UA: NEGATIVE mg/dL
Hgb urine dipstick: NEGATIVE
Ketones, ur: 5 mg/dL — AB
Nitrite: NEGATIVE
Protein, ur: NEGATIVE mg/dL
Specific Gravity, Urine: 1.014 (ref 1.005–1.030)
WBC, UA: >50 WBC/hpf (ref 0–5)
pH: 6 (ref 5.0–8.0)

## 2022-06-22 LAB — CBC WITH DIFFERENTIAL/PLATELET
Abs Immature Granulocytes: 0.01 10*3/uL (ref 0.00–0.07)
Basophils Absolute: 0 10*3/uL (ref 0.0–0.1)
Basophils Relative: 1 %
Eosinophils Absolute: 0.1 10*3/uL (ref 0.0–0.5)
Eosinophils Relative: 2 %
HCT: 41.5 % (ref 39.0–52.0)
Hemoglobin: 13.7 g/dL (ref 13.0–17.0)
Immature Granulocytes: 0 %
Lymphocytes Relative: 17 %
Lymphs Abs: 1 10*3/uL (ref 0.7–4.0)
MCH: 30 pg (ref 26.0–34.0)
MCHC: 33 g/dL (ref 30.0–36.0)
MCV: 90.8 fL (ref 80.0–100.0)
Monocytes Absolute: 0.5 10*3/uL (ref 0.1–1.0)
Monocytes Relative: 9 %
Neutro Abs: 4.1 10*3/uL (ref 1.7–7.7)
Neutrophils Relative %: 71 %
Platelets: 156 10*3/uL (ref 150–400)
RBC: 4.57 MIL/uL (ref 4.22–5.81)
RDW: 13.4 % (ref 11.5–15.5)
WBC: 5.8 10*3/uL (ref 4.0–10.5)
nRBC: 0 % (ref 0.0–0.2)

## 2022-06-22 LAB — I-STAT CHEM 8, ED
BUN: 11 mg/dL (ref 8–23)
Calcium, Ion: 1.2 mmol/L (ref 1.15–1.40)
Chloride: 98 mmol/L (ref 98–111)
Creatinine, Ser: 0.8 mg/dL (ref 0.61–1.24)
Glucose, Bld: 124 mg/dL — ABNORMAL HIGH (ref 70–99)
HCT: 42 % (ref 39.0–52.0)
Hemoglobin: 14.3 g/dL (ref 13.0–17.0)
Potassium: 4.3 mmol/L (ref 3.5–5.1)
Sodium: 139 mmol/L (ref 135–145)
TCO2: 34 mmol/L — ABNORMAL HIGH (ref 22–32)

## 2022-06-22 NOTE — ED Provider Notes (Signed)
[place] EMERGENCY DEPARTMENT AT [facility] Provider Note   CSN: [id number] Arrival date & time: 06/22/22  1243     History  No chief complaint on file.   Cory Johnson is a 67 y.o. male.  67 year old male here after having a mechanical fall prior to arrival.  Patient fell onto his left chest and left shoulder.  Has history of Parkinson's and has been having trouble with his gait.  No recent illnesses.  He did not strike his head.  Did not have any loss of consciousness.  Complains of mostly left-sided rib pain.  Went to urgent care but due to his tremors they were unable to get his films and was sent here for further evaluation.       Home Medications Prior to Admission medications   Medication Sig Start Date End Date Taking? Authorizing Provider  Carbidopa-Levodopa ER (SINEMET CR) 25-100 MG tablet controlled release Take 1 tablet by mouth in the morning, at noon, and at bedtime. 10/21/19   Levert Feinstein, MD  finasteride (PROSCAR) 5 MG tablet Take 5 mg by mouth daily.    [provider]  HYDROcodone-acetaminophen (NORCO) 10-325 MG tablet Take 1 tablet by mouth 5 (five) times daily as needed for moderate pain. 08/02/18   [provider]  levofloxacin (LEVAQUIN) 500 MG tablet Take 1 tablet by mouth daily.    [provider]  nitrofurantoin (MACRODANTIN) 50 MG capsule Take 1 capsule by mouth daily. 09/25/21   [provider]  ondansetron (ZOFRAN-ODT) 4 MG disintegrating tablet Take 1 tablet (4 mg total) by mouth every 8 (eight) hours as needed for nausea or vomiting. 10/24/21   Lamptey, Britta Mccreedy, MD  Pimavanserin Tartrate 34 MG CAPS Take by mouth. 04/14/22 04/14/23  [provider]  risperiDONE (RISPERDAL) 0.25 MG tablet Take 0.25 mg by mouth at bedtime. Twice daily    [provider]  tamsulosin (FLOMAX) 0.4 MG CAPS capsule Take 0.4 mg by mouth.    [provider]  vitamin B-12 (CYANOCOBALAMIN) 500 MCG tablet Take 1  tablet (500 mcg total) by mouth daily. 09/13/18   Erick Blinks, MD      Allergies    Patient has no known allergies.    Review of Systems   Review of Systems  All other systems reviewed and are negative.   Physical Exam Updated Vital Signs BP 121/83 (BP Location: Left Arm)   Pulse 61   Temp 98.4 F (36.9 C)   Resp 18   SpO2 99%  Physical Exam Vitals and nursing note reviewed.  Constitutional:      General: He is not in acute distress.    Appearance: Normal appearance. He is well-developed. He is not toxic-appearing.  HENT:     Head: Normocephalic and atraumatic.  Eyes:     General: Lids are normal.     Conjunctiva/sclera: Conjunctivae normal.     Pupils: Pupils are equal, round, and reactive to light.  Neck:     Thyroid: No thyroid mass.     Trachea: No tracheal deviation.  Cardiovascular:     Rate and Rhythm: Normal rate and regular rhythm.     Heart sounds: Normal heart sounds. No murmur heard.    No gallop.  Pulmonary:     Effort: Pulmonary effort is normal. No respiratory distress.     Breath sounds: Normal breath sounds. No stridor. No decreased breath sounds, wheezing, rhonchi or rales.  Chest:    Abdominal:  General: There is no distension.     Palpations: Abdomen is soft.     Tenderness: There is no abdominal tenderness. There is no rebound.  Musculoskeletal:        General: No tenderness.     Left shoulder: No swelling, deformity or tenderness. Decreased range of motion.     Cervical back: Normal range of motion and neck supple.  Skin:    General: Skin is warm and dry.     Findings: No abrasion or rash.  Neurological:     Mental Status: He is alert and oriented to person, place, and time. Mental status is at baseline.     GCS: GCS eye subscore is 4. GCS verbal subscore is 5. GCS motor subscore is 6.     Cranial Nerves: No cranial nerve deficit.     Sensory: No sensory deficit.     Motor: Motor function is intact.  Psychiatric:        Attention  and Perception: Attention normal.        Speech: Speech normal.        Behavior: Behavior normal.     ED Results / Procedures / Treatments   Labs (all labs ordered are listed, but only abnormal results are displayed) Labs Reviewed  CBC WITH DIFFERENTIAL/PLATELET  URINALYSIS, ROUTINE W REFLEX MICROSCOPIC  I-STAT CHEM 8, ED    EKG None  Radiology CT Chest Wo Contrast  Result Date: 06/22/2022 CLINICAL DATA:  Possible rib fracture. EXAM: CT CHEST WITHOUT CONTRAST TECHNIQUE: Multidetector CT imaging of the chest was performed following the standard protocol without IV contrast. RADIATION DOSE REDUCTION: This exam was performed according to the departmental dose-optimization program which includes automated exposure control, adjustment of the mA and/or kV according to patient size and/or use of iterative reconstruction technique. COMPARISON:  November 07, 2009. FINDINGS: Cardiovascular: No significant vascular findings. Normal heart size. No pericardial effusion. Mediastinum/Nodes: Evaluation for adenopathy is limited due to the lack of intravenous contrast. Thyroid gland, trachea, and esophagus demonstrate no significant findings. Lungs/Pleura: Lungs are clear. No pleural effusion or pneumothorax. Upper Abdomen: Bilateral nonobstructive nephrolithiasis. Musculoskeletal: Severe thoracic kyphosis is noted. No acute osseous abnormality is noted. No definite rib fracture is noted. IMPRESSION: Severe thoracic kyphosis. No definite evidence of rib fracture. Bilateral nonobstructive nephrolithiasis. No acute pulmonary disease. Electronically Signed   By: Lupita Raider M.D.   On: 06/22/2022 15:03    Procedures Procedures    Medications Ordered in ED Medications - No data to display  ED Course/ Medical Decision Making/ A&P                             Medical Decision Making Amount and/or Complexity of Data Reviewed Radiology: ordered.   Patient's x-ray of his left shoulder per my  interpretation showed no acute fracture.  Chest CT also negative for fracture.  Will discharge home        Final Clinical Impression(s) / ED Diagnoses Final diagnoses:  None    Rx / DC Orders ED Discharge Orders     None         Lorre Nick, MD 06/22/22 1954

## 2022-06-22 NOTE — ED Provider Triage Note (Signed)
Emergency Medicine Provider Triage Evaluation Note  Cory Johnson , a 68 y.o. male  was evaluated in triage.  Pt complains of fall. Tripped on a threshold while walking yesterday and fell. Struck L side of body on the ground.  Having pain to L shoulder, L elbow and L chest.  No sob, denies hitting head of LOC.  Not on blood thinner.  No precipitating sxs prior to the fall.  Review of Systems  Positive: As above Negative: As above  Physical Exam  BP (!) 130/100 (BP Location: Right Arm)   Pulse 69   Temp 98.5 F (36.9 C) (Oral)   Resp 18   SpO2 96%  Gen:   Awake, no distress   Resp:  Normal effort  MSK:   Moves extremities without difficulty  Other:    Medical Decision Making  Medically screening exam initiated at 1:32 PM.  Appropriate orders placed.  Cory Johnson was informed that the remainder of the evaluation will be completed by another provider, this initial triage assessment does not replace that evaluation, and the importance of remaining in the ED until their evaluation is complete.     Cory Helper, PA-C 06/22/22 1351

## 2022-06-22 NOTE — ED Provider Notes (Signed)
[facility]    CSN: [id number] Arrival date & time: 06/22/22  1023      History   Chief Complaint Chief Complaint  Patient presents with   Fall    HPI Cory Johnson is a 67 y.o. male.   Patient presents for further evaluation after a fall that occurred last night around 8 PM.  Patient reports that he tripped over his tennis shoes landing on his left side.  He is reporting left rib pain, left wrist pain, left shoulder pain.  He denies hitting his head or losing consciousness.  He denies that he takes any blood thinning medications.  Reports that he took some hydrocodone last night for pain.  Denies numbness or tingling.  Patient denies shortness of breath.  Reports that he landed on the floor which is where his ribs impacted.  Significant medical history includes Parkinson's disease.   Fall    Past Medical History:  Diagnosis Date   Acute pulmonary embolus 11/07/2009   pt denies   Arthritis    neck - limited range of motion; back, shoulders   Chronic back pain    Difficult intubation    DJD in back and neck, pt unable to lay flat.   DJD (degenerative joint disease)    DVT of lower extremity (deep venous thrombosis) 11/07/2009   Occasional tremors 03/2017   HANDS    Parkinson's disease    Right inguinal hernia 02/2011   Seizures    pt denies   Syncope and collapse 03/26/2017   Umbilical hernia 02/2011    Patient Active Problem List   Diagnosis Date Noted   Vasomotor rhinitis 06/28/2020   Sensorineural hearing loss (SNHL), bilateral 06/28/2020   Movement disorder 10/21/2019   Dementia with behavioral disturbance 10/21/2019   Lewy body dementia 01/01/2019   Sepsis 09/10/2018   Acute metabolic encephalopathy 09/10/2018   Thrombocytopenia 09/10/2018   Total bilirubin, elevated 09/10/2018   Cerebellar ataxia in diseases classified elsewhere 09/17/2017   REM sleep behavior disorder 09/17/2017   Chronic pain syndrome 05/21/2017   Tremor 05/08/2017    Gait abnormality 05/08/2017   Transient hypotension 05/05/2017   Contracture of joint of finger 04/16/2017   Long-term current use of opiate analgesic 04/02/2017   Malnutrition of moderate degree 03/28/2017   Syncope and collapse 03/26/2017   Closed fracture of shaft of left humerus 03/19/2017   Subungual hematoma of great toe of right foot 12/13/2016   Pain of toe of right foot 12/13/2016   Paresthesia 06/29/2016   Tremor of right hand 06/29/2016   Mild cognitive impairment 06/29/2016   Tremors of nervous system 06/22/2016   Drooling 06/22/2016   Loss of weight 06/22/2016   Family history of throat cancer 06/22/2016   Abnormality of gait 06/09/2016   Memory loss 12/31/2013   Arthritis    Myoclonic jerking    Right inguinal hernia 03/17/2011   Umbilical hernia 03/17/2011   Acute pulmonary embolus 11/07/2009   DVT of lower extremity (deep venous thrombosis) 11/07/2009    Past Surgical History:  Procedure Laterality Date   CLOSED REDUCTION FINGER WITH PERCUTANEOUS PINNING Left 06/26/2018   Procedure: CLOSED REDUCTION FINGER WITH PERCUTANEOUS PINNING;  Surgeon: Ernest Mallick, MD;  Location: [facility];  Service: Orthopedics;  Laterality: Left;   CYSTOSCOPY WITH URETHRAL DILATATION N/A 11/02/2020   Procedure: CYSTOSCOPY WITH BULBAR URETHRAL DILATATION;  Surgeon: [name], MD;  Location: [facility];  Service: Urology;  Laterality: N/A;  HERNIA REPAIR     I & D EXTREMITY Left 06/26/2018   Procedure: Left Index finger wound exploration, irrigation and debridement, fracture fixation, flexor tendon repair, digital nerve repair with conduit as needed and surgery as indicated;  Surgeon: Ernest Mallickreighton, James J III, MD;  Location: [facility];  Service: Orthopedics;  Laterality: Left;  90min   INGUINAL HERNIA REPAIR  03/17/2011   Procedure: HERNIA REPAIR INGUINAL ADULT;  Surgeon: Ernestene MentionHaywood M Ingram, MD;  Location: [facility];  Service: General;  Laterality: Right;  right inguinal hernia repair with mesh   KNEE SURGERY  1977   left   NERVE AND TENDON REPAIR Left 06/26/2018   Procedure: NERVE AND TENDON REPAIR;  Surgeon: Ernest Mallickreighton, James J III, MD;  Location: [facility];  Service: Orthopedics;  Laterality: Left;   UMBILICAL HERNIA REPAIR  03/17/2011   Procedure: HERNIA REPAIR UMBILICAL ADULT;  Surgeon: Ernestene MentionHaywood M Ingram, MD;  Location: [facility];  Service: General;  Laterality: N/A;  umbilical hernia repair with mesh       Home Medications    Prior to Admission medications   Medication Sig Start Date End Date Taking? Authorizing Provider  Carbidopa-Levodopa ER (SINEMET CR) 25-100 MG tablet controlled release Take 1 tablet by mouth in the morning, at noon, and at bedtime. 10/21/19  Yes Levert FeinsteinYan, Yijun, MD  HYDROcodone-acetaminophen (NORCO) 10-325 MG tablet Take 1 tablet by mouth 5 (five) times daily as needed for moderate pain. 08/02/18  Yes [provider]  Pimavanserin Tartrate 34 MG CAPS Take by mouth. 04/14/22 04/14/23 Yes [provider]  risperiDONE (RISPERDAL) 0.25 MG tablet Take 0.25 mg by mouth at bedtime. Twice daily   Yes [provider]  vitamin B-12 (CYANOCOBALAMIN) 500 MCG tablet Take 1 tablet (500 mcg total) by mouth daily. 09/13/18  Yes Erick BlinksMemon, Jehanzeb, MD  finasteride (PROSCAR) 5 MG tablet Take 5 mg by mouth daily.    [provider]  levofloxacin (LEVAQUIN) 500 MG tablet Take 1 tablet by mouth daily.    [provider]  nitrofurantoin (MACRODANTIN) 50 MG capsule Take 1 capsule by mouth daily. 09/25/21   [provider]  ondansetron (ZOFRAN-ODT) 4 MG disintegrating tablet Take 1 tablet (4 mg total) by mouth every 8 (eight) hours as needed for nausea or vomiting. 10/24/21   Lamptey, Britta MccreedyPhilip O, MD  tamsulosin (FLOMAX) 0.4 MG CAPS capsule Take 0.4 mg by mouth.    [provider]    Family History Family History   Problem Relation Age of Onset   Hypertension Other     Social History Social History   Tobacco Use   Smoking status: Never   Smokeless tobacco: Never  Vaping Use   Vaping Use: Never used  Substance Use Topics   Alcohol use: No   Drug use: No     Allergies   Patient has no known allergies.   Review of Systems Review of Systems Per HPI  Physical Exam Triage Vital Signs ED Triage Vitals  Enc Vitals Group     BP 06/22/22 1131 136/85     Pulse Rate 06/22/22 1131 63     Resp 06/22/22 1131 16     Temp 06/22/22 1131 98 F (36.7 C)     Temp Source 06/22/22 1131 Oral     SpO2 06/22/22 1131 96 %     Weight --      Height --      Head Circumference --  Peak Flow --      Pain Score 06/22/22 1134 9     Pain Loc --      Pain Edu? --      Excl. in GC? --    No data found.  Updated Vital Signs BP 136/85 (BP Location: Right Arm)   Pulse 63   Temp 98 F (36.7 C) (Oral)   Resp 16   SpO2 96%   Visual Acuity Right Eye Distance:   Left Eye Distance:   Bilateral Distance:    Right Eye Near:   Left Eye Near:    Bilateral Near:     Physical Exam Constitutional:      General: He is not in acute distress.    Appearance: Normal appearance. He is not toxic-appearing or diaphoretic.  HENT:     Head: Normocephalic and atraumatic.  Eyes:     Extraocular Movements: Extraocular movements intact.     Conjunctiva/sclera: Conjunctivae normal.  Cardiovascular:     Rate and Rhythm: Normal rate and regular rhythm.     Pulses: Normal pulses.     Heart sounds: Normal heart sounds.  Pulmonary:     Effort: Pulmonary effort is normal. No respiratory distress.     Breath sounds: Normal breath sounds. No stridor. No wheezing, rhonchi or rales.  Chest:     Chest wall: Tenderness present.     Comments: Patient has tenderness to palpation to left lateral lower ribs.  There is no obvious discoloration, lacerations, abrasions, crepitus noted. Musculoskeletal:     Comments:  Tenderness to palpation to lateral left shoulder.  No obvious bruising, discoloration, lacerations, abrasions noted.  No tenderness to clavicle.  No tenderness to upper extremity.  No tenderness to elbow, forearm.  Patient does have some tenderness to radial portion of left wrist that extends slightly into forearm.  No swelling or discoloration noted.  No tenderness to hand or fingers.  Grip strength is 5/5.  Patient is neurovascularly intact. Patient has full ROM of upper extremity.   Neurological:     General: No focal deficit present.     Mental Status: He is alert and oriented to person, place, and time. Mental status is at baseline.  Psychiatric:        Mood and Affect: Mood normal.        Behavior: Behavior normal.        Thought Content: Thought content normal.        Judgment: Judgment normal.      UC Treatments / Results  Labs (all labs ordered are listed, but only abnormal results are displayed) Labs Reviewed - No data to display  EKG   Radiology No results found.  Procedures Procedures (including critical care time)  Medications Ordered in UC Medications - No data to display  Initial Impression / Assessment and Plan / UC Course  I have reviewed the triage vital signs and the nursing notes.  Pertinent labs & imaging results that were available during my care of the patient were reviewed by me and considered in my medical decision making (see chart for details).     X-ray of left ribs, left shoulder, left wrist was ordered.  X-ray tech informed me that she is not able to get x-rays given patient's body curvature and we are not able to do x-rays lying flat.  Therefore, had a discussion with patient and his family member about the need to have either CT imaging or x-ray where he can lie flat.  Therefore,  recommend that he go to the emergency department today to have this completed as x-ray of the ribs is very important.  Patient and family were agreeable with this and left  via self transport to go to the ER as vital signs and the patient were stable. Final Clinical Impressions(s) / UC Diagnoses   Final diagnoses:  Fall, initial encounter  Rib pain on left side  Left wrist pain  Acute pain of left shoulder     Discharge Instructions      Go to the emergency department to have more advanced imaging.    ED Prescriptions   None    PDMP not reviewed this encounter.   Gustavus Bryant, [place] 06/22/22 1231

## 2022-06-22 NOTE — ED Triage Notes (Signed)
Pt fell last night and injured left side . Pt took a hydrocodone last night after the fall .

## 2022-06-22 NOTE — Discharge Instructions (Signed)
Go to the emergency department to have more advanced imaging.

## 2022-06-22 NOTE — Discharge Instructions (Signed)
X-ray of your left shoulder was negative for fracture.  Use your hydrocodone as directed at home for pain as you need to.

## 2022-06-22 NOTE — ED Triage Notes (Signed)
Pt fell at 8 pm last night, mechanical; went to urgent care; unable to get x ray, sent to ed for further evaluation; did not hit head, not thinners; c/o L rib , L elbow pain

## 2022-06-27 ENCOUNTER — Telehealth: Payer: Self-pay | Admitting: *Deleted

## 2022-06-27 NOTE — Telephone Encounter (Signed)
        Patient  visited [facility] on 06/22/2022  for treatment    Telephone encounter attempt :  1st  A HIPAA compliant voice message was left requesting a return call.  Instructed patient to call back at [phone]. Cory Johnson [facility] [place], Population Health [phone] [address] , [place] [address] Email : [email or web address]       

## 2022-06-28 DIAGNOSIS — M5030 Other cervical disc degeneration, unspecified cervical region: Secondary | ICD-10-CM | POA: Diagnosis not present

## 2022-06-28 DIAGNOSIS — Z79891 Long term (current) use of opiate analgesic: Secondary | ICD-10-CM | POA: Diagnosis not present

## 2022-06-28 DIAGNOSIS — R296 Repeated falls: Secondary | ICD-10-CM | POA: Diagnosis not present

## 2022-06-28 DIAGNOSIS — G894 Chronic pain syndrome: Secondary | ICD-10-CM | POA: Diagnosis not present

## 2022-06-30 DIAGNOSIS — R300 Dysuria: Secondary | ICD-10-CM | POA: Diagnosis not present

## 2022-06-30 DIAGNOSIS — R8271 Bacteriuria: Secondary | ICD-10-CM | POA: Diagnosis not present

## 2022-06-30 DIAGNOSIS — R3914 Feeling of incomplete bladder emptying: Secondary | ICD-10-CM | POA: Diagnosis not present

## 2022-07-26 ENCOUNTER — Encounter: Payer: Self-pay | Admitting: Family Medicine

## 2022-08-02 ENCOUNTER — Ambulatory Visit: Payer: Medicare Other | Admitting: Family Medicine

## 2022-08-16 ENCOUNTER — Telehealth: Payer: Self-pay | Admitting: Family Medicine

## 2022-08-18 ENCOUNTER — Telehealth: Payer: Self-pay

## 2022-08-18 ENCOUNTER — Telehealth: Payer: Self-pay | Admitting: Family Medicine

## 2022-08-18 NOTE — Telephone Encounter (Signed)
[facility]   On DPR?: Yes  Call back number: [phone]  Provider they see: Shade Flood, MD  Reason for call:  Clarify information about pt's death certificate .

## 2022-08-18 NOTE — Telephone Encounter (Signed)
Patient unfortunately passed at home on 08/19/2022 under the care of Hospice no cause of death was specified but noted as natural.  Death certificate can be found on [place]  If needed can call funeral home back (Crystal) at [phone] for more info

## 2022-08-18 NOTE — Telephone Encounter (Signed)
See other note

## 2022-08-18 NOTE — Telephone Encounter (Signed)
Noted, email received today regarding death certificate to complete on [employer].  I do see other message regarding pronouncement of death at 1:18 PM on 2022/09/08.  I called Crystal at funeral home, but no other specific information provided but she did provide me the number for nurse with [facility].  Called Holly at [phone].  She was with patient at time of his passing, time of death of 1:18 PM at home.  Natural cause.      Initial Parkinson's symptoms evaluated by neurology in March 2018.  Ultimately found to neurocognitive disorder with Lewy bodies, probable Lewy body dementia with behavioral disturbance, tremors under the care of [facility] neurology.  History of chronic pain syndrome, and benign prostatic hypertrophy.  Death certificate completed.  Called spouse to express my condolences of Mr. Cashaw's passing.  Spoke to [place].  She thanked me for my care of Mr. Schoeneck, and for the phone call.  Let her know that it was my honor to be his primary care provider and appreciated her kind words.  Denied any acute needs at this time but let her know that we are here to support her during this difficult time if needed.

## 2022-09-06 ENCOUNTER — Telehealth: Payer: Self-pay | Admitting: Family Medicine

## 2022-09-06 NOTE — Telephone Encounter (Signed)
Received discontinuation order from [facility]. Placed in your to be signed folder

## 2022-09-06 NOTE — Telephone Encounter (Signed)
Paperwork completed and placed in fax bin at back nurse station  

## 2022-09-06 NOTE — Telephone Encounter (Signed)
Home Health Verbal Orders  Agency:[facility]   Caller: (Contact and title) Call back #:  Fax #[phone]    Requesting OT/ PT/ Skilled nursing/ Social Work/ Speech:    Reason for Request:    Frequency:     HH needs F2F w/in last 30 days   Charge attached placed in front bin

## 2022-09-11 NOTE — Telephone Encounter (Signed)
Pronounced dead today at 1:18pm

## 2022-09-11 DEATH — deceased

## 2022-09-13 ENCOUNTER — Ambulatory Visit: Payer: Medicare Other | Admitting: Podiatry

## 2022-11-06 ENCOUNTER — Ambulatory Visit: Payer: Medicare Other | Admitting: Family Medicine
